# Patient Record
Sex: Female | Born: 1935 | Race: Black or African American | Hispanic: No | Smoking: Former smoker
Health system: Southern US, Community
[De-identification: ages and names within clinical notes are randomized; demographics above are authoritative.]

## PROBLEM LIST (undated history)

## (undated) DIAGNOSIS — Z992 Dependence on renal dialysis: Secondary | ICD-10-CM

## (undated) DIAGNOSIS — G479 Sleep disorder, unspecified: Secondary | ICD-10-CM

## (undated) DIAGNOSIS — N289 Disorder of kidney and ureter, unspecified: Secondary | ICD-10-CM

## (undated) DIAGNOSIS — R4182 Altered mental status, unspecified: Secondary | ICD-10-CM

## (undated) DIAGNOSIS — E162 Hypoglycemia, unspecified: Secondary | ICD-10-CM

## (undated) DIAGNOSIS — R351 Nocturia: Secondary | ICD-10-CM

## (undated) DIAGNOSIS — I10 Essential (primary) hypertension: Secondary | ICD-10-CM

## (undated) DIAGNOSIS — I150 Renovascular hypertension: Secondary | ICD-10-CM

## (undated) DIAGNOSIS — M179 Osteoarthritis of knee, unspecified: Secondary | ICD-10-CM

## (undated) DIAGNOSIS — I5031 Acute diastolic (congestive) heart failure: Secondary | ICD-10-CM

## (undated) DIAGNOSIS — E119 Type 2 diabetes mellitus without complications: Secondary | ICD-10-CM

## (undated) DIAGNOSIS — N186 End stage renal disease: Secondary | ICD-10-CM

## (undated) DIAGNOSIS — D573 Sickle-cell trait: Secondary | ICD-10-CM

## (undated) DIAGNOSIS — M1990 Unspecified osteoarthritis, unspecified site: Secondary | ICD-10-CM

## (undated) DIAGNOSIS — M1710 Unilateral primary osteoarthritis, unspecified knee: Secondary | ICD-10-CM

## (undated) DIAGNOSIS — J811 Chronic pulmonary edema: Secondary | ICD-10-CM

## (undated) DIAGNOSIS — E785 Hyperlipidemia, unspecified: Secondary | ICD-10-CM

## (undated) DIAGNOSIS — E7800 Pure hypercholesterolemia, unspecified: Secondary | ICD-10-CM

## (undated) HISTORY — PX: HERNIA REPAIR: SHX51

## (undated) HISTORY — PX: OTHER SURGICAL HISTORY: SHX169

## (undated) HISTORY — DX: Hyperlipidemia, unspecified: E78.5

## (undated) HISTORY — DX: Unilateral primary osteoarthritis, unspecified knee: M17.10

## (undated) HISTORY — DX: Renovascular hypertension: I15.0

## (undated) HISTORY — DX: Hypoglycemia, unspecified: E16.2

## (undated) HISTORY — DX: Osteoarthritis of knee, unspecified: M17.9

## (undated) HISTORY — DX: Chronic pulmonary edema: J81.1

## (undated) HISTORY — DX: Altered mental status, unspecified: R41.82

## (undated) HISTORY — PX: JOINT REPLACEMENT: SHX530

## (undated) HISTORY — DX: Acute diastolic (congestive) heart failure: I50.31

## (undated) HISTORY — DX: Morbid (severe) obesity due to excess calories: E66.01

---

## 1977-06-03 HISTORY — PX: TUBAL LIGATION: SHX77

## 1979-02-02 HISTORY — PX: BREAST BIOPSY: SHX20

## 2005-01-11 ENCOUNTER — Encounter: Admission: RE | Admit: 2005-01-11 | Discharge: 2005-01-11 | Payer: Self-pay | Admitting: Internal Medicine

## 2006-02-20 ENCOUNTER — Encounter: Admission: RE | Admit: 2006-02-20 | Discharge: 2006-02-20 | Payer: Self-pay | Admitting: Internal Medicine

## 2007-06-02 ENCOUNTER — Encounter: Admission: RE | Admit: 2007-06-02 | Discharge: 2007-06-02 | Payer: Self-pay | Admitting: Internal Medicine

## 2007-06-03 ENCOUNTER — Encounter: Admission: RE | Admit: 2007-06-03 | Discharge: 2007-06-03 | Payer: Self-pay | Admitting: Internal Medicine

## 2008-11-22 ENCOUNTER — Encounter: Admission: RE | Admit: 2008-11-22 | Discharge: 2008-11-22 | Payer: Self-pay | Admitting: Internal Medicine

## 2008-12-15 ENCOUNTER — Encounter (INDEPENDENT_AMBULATORY_CARE_PROVIDER_SITE_OTHER): Payer: Self-pay | Admitting: Interventional Radiology

## 2008-12-15 ENCOUNTER — Ambulatory Visit (HOSPITAL_COMMUNITY): Admission: RE | Admit: 2008-12-15 | Discharge: 2008-12-15 | Payer: Self-pay | Admitting: Surgery

## 2008-12-20 ENCOUNTER — Encounter: Admission: RE | Admit: 2008-12-20 | Discharge: 2008-12-20 | Payer: Self-pay | Admitting: Diagnostic Radiology

## 2009-02-03 ENCOUNTER — Ambulatory Visit (HOSPITAL_COMMUNITY): Admission: RE | Admit: 2009-02-03 | Discharge: 2009-02-03 | Payer: Self-pay | Admitting: Surgery

## 2009-06-03 HISTORY — PX: TOTAL KNEE ARTHROPLASTY: SHX125

## 2009-07-12 ENCOUNTER — Encounter: Admission: RE | Admit: 2009-07-12 | Discharge: 2009-07-12 | Payer: Self-pay | Admitting: Internal Medicine

## 2009-10-02 ENCOUNTER — Inpatient Hospital Stay (HOSPITAL_COMMUNITY): Admission: RE | Admit: 2009-10-02 | Discharge: 2009-10-05 | Payer: Self-pay | Admitting: Orthopedic Surgery

## 2009-10-16 ENCOUNTER — Emergency Department (HOSPITAL_COMMUNITY): Admission: EM | Admit: 2009-10-16 | Discharge: 2009-10-16 | Payer: Self-pay | Admitting: Emergency Medicine

## 2010-06-24 ENCOUNTER — Encounter: Payer: Self-pay | Admitting: Internal Medicine

## 2010-08-17 ENCOUNTER — Other Ambulatory Visit: Payer: Self-pay | Admitting: Internal Medicine

## 2010-08-17 DIAGNOSIS — Z1231 Encounter for screening mammogram for malignant neoplasm of breast: Secondary | ICD-10-CM

## 2010-08-20 LAB — URINALYSIS, ROUTINE W REFLEX MICROSCOPIC
Bilirubin Urine: NEGATIVE
Protein, ur: NEGATIVE mg/dL
Urobilinogen, UA: 0.2 mg/dL (ref 0.0–1.0)

## 2010-08-20 LAB — CBC
HCT: 34.6 % — ABNORMAL LOW (ref 36.0–46.0)
MCHC: 33.6 g/dL (ref 30.0–36.0)
MCV: 84 fL (ref 78.0–100.0)
Platelets: 422 10*3/uL — ABNORMAL HIGH (ref 150–400)

## 2010-08-20 LAB — DIFFERENTIAL
Basophils Absolute: 0 10*3/uL (ref 0.0–0.1)
Basophils Relative: 0 % (ref 0–1)
Lymphs Abs: 0.6 10*3/uL — ABNORMAL LOW (ref 0.7–4.0)
Monocytes Absolute: 0.6 10*3/uL (ref 0.1–1.0)
Neutrophils Relative %: 86 % — ABNORMAL HIGH (ref 43–77)

## 2010-08-20 LAB — BASIC METABOLIC PANEL
CO2: 24 mEq/L (ref 19–32)
Glucose, Bld: 58 mg/dL — ABNORMAL LOW (ref 70–99)
Potassium: 3.7 mEq/L (ref 3.5–5.1)
Sodium: 141 mEq/L (ref 135–145)

## 2010-08-20 LAB — GLUCOSE, CAPILLARY: Glucose-Capillary: 107 mg/dL — ABNORMAL HIGH (ref 70–99)

## 2010-08-20 LAB — PROTIME-INR: INR: 2.55 — ABNORMAL HIGH (ref 0.00–1.49)

## 2010-08-21 LAB — TYPE AND SCREEN: Antibody Screen: NEGATIVE

## 2010-08-21 LAB — CBC
HCT: 25.6 % — ABNORMAL LOW (ref 36.0–46.0)
HCT: 30.4 % — ABNORMAL LOW (ref 36.0–46.0)
Hemoglobin: 8.3 g/dL — ABNORMAL LOW (ref 12.0–15.0)
Hemoglobin: 9 g/dL — ABNORMAL LOW (ref 12.0–15.0)
MCHC: 31.8 g/dL (ref 30.0–36.0)
MCHC: 32.5 g/dL (ref 30.0–36.0)
MCHC: 32.4 g/dL (ref 30.0–36.0)
MCV: 84 fL (ref 78.0–100.0)
MCV: 83.6 fL (ref 78.0–100.0)
Platelets: 139 10*3/uL — ABNORMAL LOW (ref 150–400)
Platelets: 208 10*3/uL (ref 150–400)
RBC: 3.04 MIL/uL — ABNORMAL LOW (ref 3.87–5.11)
RDW: 16.8 % — ABNORMAL HIGH (ref 11.5–15.5)
RDW: 17.4 % — ABNORMAL HIGH (ref 11.5–15.5)
RDW: 17.5 % — ABNORMAL HIGH (ref 11.5–15.5)
RDW: 17.8 % — ABNORMAL HIGH (ref 11.5–15.5)

## 2010-08-21 LAB — BASIC METABOLIC PANEL
BUN: 23 mg/dL (ref 6–23)
CO2: 23 mEq/L (ref 19–32)
CO2: 25 mEq/L (ref 19–32)
Calcium: 8.1 mg/dL — ABNORMAL LOW (ref 8.4–10.5)
Chloride: 106 mEq/L (ref 96–112)
Creatinine, Ser: 1.14 mg/dL (ref 0.4–1.2)
Creatinine, Ser: 1.21 mg/dL — ABNORMAL HIGH (ref 0.4–1.2)
GFR calc Af Amer: 46 mL/min — ABNORMAL LOW (ref >60)
GFR calc Af Amer: 57 mL/min — ABNORMAL LOW (ref >60)
GFR calc non Af Amer: 44 mL/min — ABNORMAL LOW (ref >60)
GFR calc non Af Amer: 47 mL/min — ABNORMAL LOW (ref >60)
Glucose, Bld: 147 mg/dL — ABNORMAL HIGH (ref 70–99)
Glucose, Bld: 152 mg/dL — ABNORMAL HIGH (ref 70–99)
Glucose, Bld: 155 mg/dL — ABNORMAL HIGH (ref 70–99)
Potassium: 3.9 mEq/L (ref 3.5–5.1)
Potassium: 4.3 mEq/L (ref 3.5–5.1)
Sodium: 134 mEq/L — ABNORMAL LOW (ref 135–145)
Sodium: 137 mEq/L (ref 135–145)

## 2010-08-21 LAB — COMPREHENSIVE METABOLIC PANEL
ALT: 15 U/L (ref 0–35)
AST: 19 U/L (ref 0–37)
Calcium: 9.7 mg/dL (ref 8.4–10.5)
GFR calc Af Amer: >60 mL/min (ref >60)
Sodium: 143 mEq/L (ref 135–145)
Total Protein: 7.1 g/dL (ref 6.0–8.3)

## 2010-08-21 LAB — GLUCOSE, CAPILLARY
Glucose-Capillary: 133 mg/dL — ABNORMAL HIGH (ref 70–99)
Glucose-Capillary: 148 mg/dL — ABNORMAL HIGH (ref 70–99)
Glucose-Capillary: 148 mg/dL — ABNORMAL HIGH (ref 70–99)
Glucose-Capillary: 166 mg/dL — ABNORMAL HIGH (ref 70–99)
Glucose-Capillary: 213 mg/dL — ABNORMAL HIGH (ref 70–99)

## 2010-08-21 LAB — URINE MICROSCOPIC-ADD ON

## 2010-08-21 LAB — PROTIME-INR
INR: 1.23 (ref 0.00–1.49)
INR: 1.54 — ABNORMAL HIGH (ref 0.00–1.49)
Prothrombin Time: 15.4 seconds — ABNORMAL HIGH (ref 11.6–15.2)
Prothrombin Time: 20.4 seconds — ABNORMAL HIGH (ref 11.6–15.2)

## 2010-08-21 LAB — URINALYSIS, ROUTINE W REFLEX MICROSCOPIC
Hgb urine dipstick: NEGATIVE
Nitrite: NEGATIVE
Specific Gravity, Urine: 1.016 (ref 1.005–1.030)
Urobilinogen, UA: 0.2 mg/dL (ref 0.0–1.0)

## 2010-08-21 LAB — APTT: aPTT: 33 seconds (ref 24–37)

## 2010-09-10 LAB — CBC
HCT: 38.3 % (ref 36.0–46.0)
MCV: 84.8 fL (ref 78.0–100.0)
RBC: 4.51 MIL/uL (ref 3.87–5.11)
WBC: 9.6 10*3/uL (ref 4.0–10.5)

## 2010-09-10 LAB — GLUCOSE, CAPILLARY: Glucose-Capillary: 143 mg/dL — ABNORMAL HIGH (ref 70–99)

## 2010-09-10 LAB — CULTURE, ROUTINE-ABSCESS
Culture: NO GROWTH
Gram Stain: NONE SEEN

## 2010-09-10 LAB — APTT: aPTT: 38 seconds — ABNORMAL HIGH (ref 24–37)

## 2010-09-10 LAB — PROTIME-INR: Prothrombin Time: 13.9 seconds (ref 11.6–15.2)

## 2010-09-12 ENCOUNTER — Ambulatory Visit
Admission: RE | Admit: 2010-09-12 | Discharge: 2010-09-12 | Disposition: A | Discharge status: Home / Self Care | Payer: Medicare Other | Source: Ambulatory Visit | Attending: Internal Medicine | Admitting: Internal Medicine

## 2010-09-12 DIAGNOSIS — Z1231 Encounter for screening mammogram for malignant neoplasm of breast: Secondary | ICD-10-CM

## 2011-09-20 ENCOUNTER — Other Ambulatory Visit: Payer: Self-pay | Admitting: Internal Medicine

## 2011-09-20 DIAGNOSIS — Z1231 Encounter for screening mammogram for malignant neoplasm of breast: Secondary | ICD-10-CM

## 2011-10-09 ENCOUNTER — Ambulatory Visit
Admission: RE | Admit: 2011-10-09 | Discharge: 2011-10-09 | Disposition: A | Discharge status: Home / Self Care | Payer: Medicare Other | Source: Ambulatory Visit | Attending: Internal Medicine | Admitting: Internal Medicine

## 2011-10-09 DIAGNOSIS — Z1231 Encounter for screening mammogram for malignant neoplasm of breast: Secondary | ICD-10-CM

## 2012-02-03 ENCOUNTER — Inpatient Hospital Stay (HOSPITAL_COMMUNITY): Payer: Medicare Other

## 2012-02-03 ENCOUNTER — Encounter (HOSPITAL_COMMUNITY): Payer: Self-pay | Admitting: Emergency Medicine

## 2012-02-03 ENCOUNTER — Inpatient Hospital Stay (HOSPITAL_COMMUNITY)
Admission: EM | Admit: 2012-02-03 | Discharge: 2012-02-05 | DRG: 638 | Disposition: A | Discharge status: Home Health | Payer: Medicare Other | Attending: Internal Medicine | Admitting: Internal Medicine

## 2012-02-03 ENCOUNTER — Emergency Department (HOSPITAL_COMMUNITY): Payer: Medicare Other

## 2012-02-03 DIAGNOSIS — E1165 Type 2 diabetes mellitus with hyperglycemia: Secondary | ICD-10-CM | POA: Diagnosis present

## 2012-02-03 DIAGNOSIS — E785 Hyperlipidemia, unspecified: Secondary | ICD-10-CM | POA: Diagnosis present

## 2012-02-03 DIAGNOSIS — M1990 Unspecified osteoarthritis, unspecified site: Secondary | ICD-10-CM

## 2012-02-03 DIAGNOSIS — R4182 Altered mental status, unspecified: Secondary | ICD-10-CM

## 2012-02-03 DIAGNOSIS — N179 Acute kidney failure, unspecified: Secondary | ICD-10-CM

## 2012-02-03 DIAGNOSIS — E162 Hypoglycemia, unspecified: Secondary | ICD-10-CM | POA: Diagnosis present

## 2012-02-03 DIAGNOSIS — I10 Essential (primary) hypertension: Secondary | ICD-10-CM

## 2012-02-03 DIAGNOSIS — R531 Weakness: Secondary | ICD-10-CM

## 2012-02-03 DIAGNOSIS — E119 Type 2 diabetes mellitus without complications: Secondary | ICD-10-CM

## 2012-02-03 DIAGNOSIS — N182 Chronic kidney disease, stage 2 (mild): Secondary | ICD-10-CM | POA: Diagnosis present

## 2012-02-03 DIAGNOSIS — R5383 Other fatigue: Secondary | ICD-10-CM

## 2012-02-03 DIAGNOSIS — T502X5A Adverse effect of carbonic-anhydrase inhibitors, benzothiadiazides and other diuretics, initial encounter: Secondary | ICD-10-CM | POA: Diagnosis present

## 2012-02-03 DIAGNOSIS — Z6841 Body Mass Index (BMI) 40.0 and over, adult: Secondary | ICD-10-CM

## 2012-02-03 DIAGNOSIS — E1129 Type 2 diabetes mellitus with other diabetic kidney complication: Secondary | ICD-10-CM | POA: Diagnosis present

## 2012-02-03 DIAGNOSIS — E1169 Type 2 diabetes mellitus with other specified complication: Principal | ICD-10-CM | POA: Diagnosis present

## 2012-02-03 DIAGNOSIS — IMO0002 Reserved for concepts with insufficient information to code with codable children: Secondary | ICD-10-CM | POA: Diagnosis present

## 2012-02-03 DIAGNOSIS — D573 Sickle-cell trait: Secondary | ICD-10-CM

## 2012-02-03 DIAGNOSIS — Y92009 Unspecified place in unspecified non-institutional (private) residence as the place of occurrence of the external cause: Secondary | ICD-10-CM

## 2012-02-03 DIAGNOSIS — I129 Hypertensive chronic kidney disease with stage 1 through stage 4 chronic kidney disease, or unspecified chronic kidney disease: Secondary | ICD-10-CM | POA: Diagnosis present

## 2012-02-03 HISTORY — DX: Essential (primary) hypertension: I10

## 2012-02-03 HISTORY — DX: Sickle-cell trait: D57.3

## 2012-02-03 HISTORY — DX: Type 2 diabetes mellitus without complications: E11.9

## 2012-02-03 HISTORY — DX: Pure hypercholesterolemia, unspecified: E78.00

## 2012-02-03 HISTORY — DX: Disorder of kidney and ureter, unspecified: N28.9

## 2012-02-03 HISTORY — DX: Unspecified osteoarthritis, unspecified site: M19.90

## 2012-02-03 LAB — CBC
HCT: 37.5 % (ref 36.0–46.0)
Hemoglobin: 12.8 g/dL (ref 12.0–15.0)
RBC: 4.65 MIL/uL (ref 3.87–5.11)
WBC: 12.1 10*3/uL — ABNORMAL HIGH (ref 4.0–10.5)

## 2012-02-03 LAB — CBC WITH DIFFERENTIAL/PLATELET
Basophils Relative: 0 % (ref 0–1)
HCT: 41.4 % (ref 36.0–46.0)
Hemoglobin: 14.3 g/dL (ref 12.0–15.0)
Lymphs Abs: 1 10*3/uL (ref 0.7–4.0)
MCHC: 34.5 g/dL (ref 30.0–36.0)
Monocytes Absolute: 0.3 10*3/uL (ref 0.1–1.0)
Monocytes Relative: 3 % (ref 3–12)
Neutro Abs: 10.3 10*3/uL — ABNORMAL HIGH (ref 1.7–7.7)
Neutrophils Relative %: 88 % — ABNORMAL HIGH (ref 43–77)
RBC: 5.12 MIL/uL — ABNORMAL HIGH (ref 3.87–5.11)

## 2012-02-03 LAB — GLUCOSE, CAPILLARY
Glucose-Capillary: 185 mg/dL — ABNORMAL HIGH (ref 70–99)
Glucose-Capillary: 312 mg/dL — ABNORMAL HIGH (ref 70–99)

## 2012-02-03 LAB — COMPREHENSIVE METABOLIC PANEL
Albumin: 3.8 g/dL (ref 3.5–5.2)
Alkaline Phosphatase: 107 U/L (ref 39–117)
BUN: 27 mg/dL — ABNORMAL HIGH (ref 6–23)
CO2: 24 mEq/L (ref 19–32)
Chloride: 101 mEq/L (ref 96–112)
Creatinine, Ser: 1.3 mg/dL — ABNORMAL HIGH (ref 0.50–1.10)
GFR calc Af Amer: 45 mL/min — ABNORMAL LOW (ref >90)
GFR calc non Af Amer: 39 mL/min — ABNORMAL LOW (ref >90)
Glucose, Bld: 130 mg/dL — ABNORMAL HIGH (ref 70–99)
Potassium: 3.9 mEq/L (ref 3.5–5.1)
Total Bilirubin: 0.6 mg/dL (ref 0.3–1.2)

## 2012-02-03 LAB — URINE MICROSCOPIC-ADD ON

## 2012-02-03 LAB — URINALYSIS, ROUTINE W REFLEX MICROSCOPIC
Bilirubin Urine: NEGATIVE
Ketones, ur: 15 mg/dL — AB
Leukocytes, UA: NEGATIVE
Nitrite: NEGATIVE
Protein, ur: 30 mg/dL — AB
Urobilinogen, UA: 1 mg/dL (ref 0.0–1.0)

## 2012-02-03 LAB — CREATININE, SERUM
Creatinine, Ser: 1.18 mg/dL — ABNORMAL HIGH (ref 0.50–1.10)
GFR calc Af Amer: 51 mL/min — ABNORMAL LOW (ref >90)
GFR calc non Af Amer: 44 mL/min — ABNORMAL LOW (ref >90)

## 2012-02-03 LAB — TROPONIN I: Troponin I: <0.3 ng/mL (ref <0.30)

## 2012-02-03 MED ORDER — ASPIRIN 325 MG PO TABS
325.0000 mg | ORAL_TABLET | Freq: Every day | ORAL | Status: DC
  Administered 2012-02-03 – 2012-02-05 (×3): 325 mg via ORAL
  Filled 2012-02-03 (×3): qty 1

## 2012-02-03 MED ORDER — CARVEDILOL 25 MG PO TABS
25.0000 mg | ORAL_TABLET | Freq: Two times a day (BID) | ORAL | Status: DC
  Administered 2012-02-03 – 2012-02-05 (×4): 25 mg via ORAL
  Filled 2012-02-03 (×6): qty 1

## 2012-02-03 MED ORDER — SENNOSIDES-DOCUSATE SODIUM 8.6-50 MG PO TABS: 1.0000 | ORAL_TABLET | Freq: Every evening | ORAL | Status: DC | PRN

## 2012-02-03 MED ORDER — CLONIDINE HCL 0.2 MG PO TABS
0.2000 mg | ORAL_TABLET | Freq: Two times a day (BID) | ORAL | Status: DC
  Administered 2012-02-03 – 2012-02-05 (×5): 0.2 mg via ORAL
  Filled 2012-02-03 (×6): qty 1

## 2012-02-03 MED ORDER — ACETAMINOPHEN 325 MG PO TABS: 650.0000 mg | ORAL_TABLET | ORAL | Status: DC | PRN

## 2012-02-03 MED ORDER — SODIUM CHLORIDE 0.9 % IV SOLN: INTRAVENOUS | Status: DC

## 2012-02-03 MED ORDER — ENOXAPARIN SODIUM 40 MG/0.4ML ~~LOC~~ SOLN
40.0000 mg | SUBCUTANEOUS | Status: DC
  Administered 2012-02-03 – 2012-02-05 (×3): 40 mg via SUBCUTANEOUS
  Filled 2012-02-03 (×3): qty 0.4

## 2012-02-03 MED ORDER — LORAZEPAM 2 MG/ML IJ SOLN
1.0000 mg | Freq: Once | INTRAMUSCULAR | Status: AC
  Administered 2012-02-03: 2 mg via INTRAVENOUS
  Filled 2012-02-03: qty 1

## 2012-02-03 MED ORDER — AMLODIPINE BESYLATE 10 MG PO TABS
10.0000 mg | ORAL_TABLET | Freq: Every day | ORAL | Status: DC
  Administered 2012-02-03 – 2012-02-05 (×3): 10 mg via ORAL
  Filled 2012-02-03 (×3): qty 1

## 2012-02-03 MED ORDER — ASPIRIN 300 MG RE SUPP
300.0000 mg | Freq: Every day | RECTAL | Status: DC
  Filled 2012-02-03 (×3): qty 1

## 2012-02-03 MED ORDER — ATORVASTATIN CALCIUM 20 MG PO TABS
20.0000 mg | ORAL_TABLET | Freq: Every day | ORAL | Status: DC
  Administered 2012-02-03 – 2012-02-04 (×2): 20 mg via ORAL
  Filled 2012-02-03 (×3): qty 1

## 2012-02-03 MED ORDER — SODIUM CHLORIDE 0.9 % IV SOLN
INTRAVENOUS | Status: AC
  Administered 2012-02-03: 11:00:00 via INTRAVENOUS

## 2012-02-03 MED ORDER — ACETAMINOPHEN 650 MG RE SUPP: 650.0000 mg | RECTAL | Status: DC | PRN

## 2012-02-03 MED ORDER — ONDANSETRON HCL 4 MG/2ML IJ SOLN: 4.0000 mg | Freq: Four times a day (QID) | INTRAMUSCULAR | Status: DC | PRN

## 2012-02-03 MED ORDER — LATANOPROST 0.005 % OP SOLN
1.0000 [drp] | Freq: Every day | OPHTHALMIC | Status: DC
  Administered 2012-02-03 – 2012-02-04 (×2): 1 [drp] via OPHTHALMIC
  Filled 2012-02-03: qty 2.5

## 2012-02-03 NOTE — ED Provider Notes (Signed)
History     CSN: [id number]  Arrival date & time 02/03/12  1914   First MD Initiated Contact with Patient 02/03/12 [phone]      Chief Complaint  Patient presents with  . Weakness    (Consider location/radiation/quality/duration/timing/severity/associated sxs/prior treatment) HPI Comments: Patient was brought in today by EMS for generalized weakness. She states that she woke up this morning and was diaphoretic and had been incontinent of urine. She felt weak all over. She was having a hard time with her thinking. She was a heart having a hard time with the coordination of her extremities. She was trying to dial 911 but was having a hard time figuring out how to do it. She states she felt weak all over and not more on one side versus the other. She states she felt fine when she went to bed last night. She denies any chest pain or shortness of breath. Denies any headache. She was having little bit of a hard time getting her words out. She denies any vision changes. Denies any recent head injuries. Denies any cough congestion or other recent illnesses. Denies any UTI symptoms. EMS had found her blood glucose to be 63 and gave her some oral glucose. She states that she feels a little bit better now but still feels weak all over. She denies any seizure history.  Patient is a 78 y.o. female presenting with weakness.  Weakness Primary symptoms do not include headaches, dizziness, fever, nausea or vomiting.  Additional symptoms include weakness.    Past Medical History  Diagnosis Date  . Diabetes mellitus   . Hypertension   . Renal disorder     Past Surgical History  Procedure Date  . Joint replacement     No family history on file.  History  Substance Use Topics  . Smoking status: Never Smoker   . Smokeless tobacco: Not on file  . Alcohol Use: No    OB History    Grav Para Term Preterm Abortions TAB SAB Ect Mult Living                  Review of Systems  Constitutional:  Positive for diaphoresis and fatigue. Negative for fever and chills.  HENT: Negative for congestion, rhinorrhea and sneezing.   Eyes: Negative.  Negative for visual disturbance.  Respiratory: Negative for cough, chest tightness and shortness of breath.   Cardiovascular: Negative for chest pain and leg swelling.  Gastrointestinal: Negative for nausea, vomiting, abdominal pain, diarrhea and blood in stool.  Genitourinary: Negative for frequency, hematuria, flank pain and difficulty urinating.  Musculoskeletal: Negative for back pain and arthralgias.  Skin: Negative for rash.  Neurological: Positive for speech difficulty and weakness. Negative for dizziness, numbness and headaches.  Psychiatric/Behavioral: Positive for confusion.    Allergies  Peanut-containing drug products  Home Medications   Current Outpatient Rx  Name Route Sig Dispense Refill  . AMLODIPINE BESYLATE 10 MG PO TABS Oral Take 10 mg by mouth daily.    . ASPIRIN EC 81 MG PO TBEC Oral Take 81 mg by mouth daily.    Marland Kitchen CARVEDILOL 25 MG PO TABS Oral Take 25 mg by mouth 2 (two) times daily with a meal.    . CLONIDINE HCL 0.2 MG PO TABS Oral Take 0.2 mg by mouth 2 (two) times daily.    Marland Kitchen GLIMEPIRIDE 4 MG PO TABS Oral Take 4 mg by mouth 2 (two) times daily.    . INSULIN GLARGINE 100 UNIT/ML Margaret SOLN  Subcutaneous Inject 30 Units into the skin at bedtime. Patient only took 25 units last night.    Marland Kitchen LATANOPROST 0.005 % OP SOLN Both Eyes Place 1 drop into both eyes at bedtime.    Marland Kitchen LOSARTAN POTASSIUM-HCTZ 100-25 MG PO TABS Oral Take 1 tablet by mouth daily.    Marland Kitchen ROSUVASTATIN CALCIUM 10 MG PO TABS Oral Take 10 mg by mouth daily.    Marland Kitchen SITAGLIPTIN PHOSPHATE 100 MG PO TABS Oral Take 100 mg by mouth daily.      BP 144/67  Pulse 73  Temp 97.5 F (36.4 C) (Oral)  Resp 18  SpO2 98%  Physical Exam  Constitutional: She is oriented to person, place, and time. She appears well-developed and well-nourished.  HENT:  Head: Normocephalic  and atraumatic.  Eyes: Pupils are equal, round, and reactive to light.  Neck: Normal range of motion. Neck supple.  Cardiovascular: Normal rate, regular rhythm and normal heart sounds.   Pulmonary/Chest: Effort normal and breath sounds normal. No respiratory distress. She has no wheezes. She has no rales. She exhibits no tenderness.  Abdominal: Soft. Bowel sounds are normal. There is no tenderness. There is no rebound and no guarding.  Musculoskeletal: Normal range of motion. She exhibits no edema.  Lymphadenopathy:    She has no cervical adenopathy.  Neurological: She is alert and oriented to person, place, and time. She has normal strength. No cranial nerve deficit or sensory deficit. GCS eye subscore is 4. GCS verbal subscore is 5. GCS motor subscore is 6.       FTN intact  Skin: Skin is warm and dry. No rash noted.  Psychiatric: She has a normal mood and affect.    ED Course  Procedures (including critical care time)  Results for orders placed during the hospital encounter of 02/03/12  CBC WITH DIFFERENTIAL      Component Value Range   WBC 11.7 (*) 4.0 - 10.5 K/uL   RBC 5.12 (*) 3.87 - 5.11 MIL/uL   Hemoglobin 14.3  12.0 - 15.0 g/dL   HCT 57.8  46.9 - 62.9 %   MCV 80.9  78.0 - 100.0 fL   MCH 27.9  26.0 - 34.0 pg   MCHC 34.5  30.0 - 36.0 g/dL   RDW 52.8  41.3 - 24.4 %   Platelets 199  150 - 400 K/uL   Neutrophils Relative 88 (*) 43 - 77 %   Neutro Abs 10.3 (*) 1.7 - 7.7 K/uL   Lymphocytes Relative 9 (*) 12 - 46 %   Lymphs Abs 1.0  0.7 - 4.0 K/uL   Monocytes Relative 3  3 - 12 %   Monocytes Absolute 0.3  0.1 - 1.0 K/uL   Eosinophils Relative 0  0 - 5 %   Eosinophils Absolute 0.0  0.0 - 0.7 K/uL   Basophils Relative 0  0 - 1 %   Basophils Absolute 0.0  0.0 - 0.1 K/uL  COMPREHENSIVE METABOLIC PANEL      Component Value Range   Sodium 140  135 - 145 mEq/L   Potassium 3.9  3.5 - 5.1 mEq/L   Chloride 101  96 - 112 mEq/L   CO2 24  19 - 32 mEq/L   Glucose, Bld 130 (*) 70 - 99  mg/dL   BUN 27 (*) 6 - 23 mg/dL   Creatinine, Ser 0.10 (*) 0.50 - 1.10 mg/dL   Calcium 27.2  8.4 - 53.6 mg/dL   Total Protein 8.0  6.0 -  8.3 g/dL   Albumin 3.8  3.5 - 5.2 g/dL   AST 24  0 - 37 U/L   ALT 19  0 - 35 U/L   Alkaline Phosphatase 107  39 - 117 U/L   Total Bilirubin 0.6  0.3 - 1.2 mg/dL   GFR calc non Af Amer 39 (*) >90 mL/min   GFR calc Af Amer 45 (*) >90 mL/min  TROPONIN I      Component Value Range   Troponin I <0.30  <0.30 ng/mL  URINALYSIS, ROUTINE W REFLEX MICROSCOPIC      Component Value Range   Color, Urine YELLOW  YELLOW   APPearance CLEAR  CLEAR   Specific Gravity, Urine 1.008  1.005 - 1.030   pH 6.5  5.0 - 8.0   Glucose, UA NEGATIVE  NEGATIVE mg/dL   Hgb urine dipstick TRACE (*) NEGATIVE   Bilirubin Urine NEGATIVE  NEGATIVE   Ketones, ur 15 (*) NEGATIVE mg/dL   Protein, ur 30 (*) NEGATIVE mg/dL   Urobilinogen, UA 1.0  0.0 - 1.0 mg/dL   Nitrite NEGATIVE  NEGATIVE   Leukocytes, UA NEGATIVE  NEGATIVE  URINE MICROSCOPIC-ADD ON      Component Value Range   Squamous Epithelial / LPF RARE  RARE   WBC, UA 0-2  <3 WBC/hpf   RBC / HPF 0-2  <3 RBC/hpf   Bacteria, UA RARE  RARE   Dg Chest 2 View  02/03/2012  *RADIOLOGY REPORT*  Clinical Data: Weakness and difficulty with coordination. Hypoglycemia.  CHEST - 2 VIEW  Comparison: 10/16/2009  Findings: The lungs are clear.  No edema or infiltrates.  Stable chronic elevation of the right hemidiaphragm.  Heart size is normal.  IMPRESSION: No acute findings.   Original Report Authenticated By: Reola Calkins, M.D.    Ct Head Wo Contrast  02/03/2012  *RADIOLOGY REPORT*  Clinical Data: Confusion.  Diabetes.  Hypertension.  CT HEAD WITHOUT CONTRAST  Technique:  Contiguous axial images were obtained from the base of the skull through the vertex without contrast.  Comparison: None.  Findings: Bone windows demonstrate mucosal thickening of ethmoid air cells. Clear mastoid air cells.  Aerated petrous apices.  Soft tissue windows  demonstrate mild low density in the periventricular white matter likely related to small vessel disease. No  mass lesion, hemorrhage, hydrocephalus, acute infarct, intra-axial, or extra-axial fluid collection.  IMPRESSION:  1. No acute intracranial abnormality. 2.  Sinus disease.   Original Report Authenticated By: Consuello Bossier, M.D.       1. Weakness       MDM  DDx includes hypoglycemia, seizure, TIA.  Pt without symptoms now.  No focal deficits.  Discussed with hospitalist who will admit pt        Rolan Bucco, MD 02/03/12 1034

## 2012-02-03 NOTE — ED Notes (Signed)
To ED from home via EMS, pt reports incontinence in bed overnight with weakness and difficulty with coordination, CBG initially 63, 74 after oral glucose, pt is type diabetic, pt reports improvement on arrival, stroke scale negative per EMS, NAD

## 2012-02-03 NOTE — ED Notes (Signed)
Vital signs stable. 

## 2012-02-03 NOTE — H&P (Signed)
PATIENT DETAILS Name: Katherine [name] Age: 78 y.o. Sex: female Date of Birth: Jul 24, 1933 Admit Date: 02/03/2012 PCP:No primary provider on file.   CHIEF COMPLAINT:  Transient Confusion/Disorientation  HPI: Patient is a 78 year old African American female with a past medical history of diabetes, hypertension, dyslipidemia, obesity who presented to the ED with the above-noted complaints. Per patient about 5:30 AM this morning she woke up and noticed that she was incontinent, she was sweating, upon getting up and ambulating she had some difficulty with coordination. She also complained of generalized weakness. She tried to call for help-however had a hard time dialing 911. She was subsequently evaluated by EMS and was found to be hypoglycemic with a blood sugars in the 60s-and was given oral glucose. She was then brought to the ED and I was asked to admit this patient for further evaluation. During my evaluation, all of the above noted complaints have completely resolved. Patient claims she is back to her usual baseline. Per patient report episode lasted approximately half to one hour. The patient she does not have a history of seizures, she is a diabetic and did take her insulin last night, however did not miss any of her meals. She claims that there is no way she accidentally gave herself more insulin than usual.   patient denies any headache, shortness of breath, chest pain, abdominal pain, nausea vomiting or diarrhea.   ALLERGIES:   Allergies  Allergen Reactions  . Peanut-Containing Drug Products Anaphylaxis    Patient allergic to all nuts.     PAST MEDICAL HISTORY: Past Medical History  Diagnosis Date  . Diabetes mellitus   . Hypertension   . Renal disorder     PAST SURGICAL HISTORY: Past Surgical History  Procedure Date  . Joint replacement     MEDICATIONS AT HOME: Prior to Admission medications   Medication Sig Start Date End Date Taking? Authorizing Provider  amLODipine  (NORVASC) 10 MG tablet Take 10 mg by mouth daily.   Yes Historical Provider, MD  aspirin EC 81 MG tablet Take 81 mg by mouth daily.   Yes Historical Provider, MD  carvedilol (COREG) 25 MG tablet Take 25 mg by mouth 2 (two) times daily with a meal.   Yes Historical Provider, MD  cloNIDine (CATAPRES) 0.2 MG tablet Take 0.2 mg by mouth 2 (two) times daily.   Yes Historical Provider, MD  glimepiride (AMARYL) 4 MG tablet Take 4 mg by mouth 2 (two) times daily.   Yes Historical Provider, MD  insulin glargine (LANTUS) 100 UNIT/ML injection Inject 30 Units into the skin at bedtime. Patient only took 25 units last night.   Yes Historical Provider, MD  latanoprost (XALATAN) 0.005 % ophthalmic solution Place 1 drop into both eyes at bedtime.   Yes Historical Provider, MD  losartan-hydrochlorothiazide (HYZAAR) 100-25 MG per tablet Take 1 tablet by mouth daily.   Yes Historical Provider, MD  rosuvastatin (CRESTOR) 10 MG tablet Take 10 mg by mouth daily.   Yes Historical Provider, MD  sitaGLIPtin (JANUVIA) 100 MG tablet Take 100 mg by mouth daily.   Yes Historical Provider, MD    FAMILY HISTORY: No family history  of coronary artery disease   SOCIAL HISTORY:  reports that she has never smoked. She does not have any smokeless tobacco history on file. She reports that she does not drink alcohol. Her drug history not on file.  REVIEW OF SYSTEMS:  Constitutional:   No  weight loss, night sweats,  Fevers, chills, fatigue.  HEENT:    No headaches, Difficulty swallowing,Tooth/dental problems,Sore throat,  No sneezing, itching, ear ache, nasal congestion, post nasal drip,   Cardio-vascular: No chest pain,  Orthopnea, PND, swelling in lower extremities, anasarca, dizziness, palpitations  GI:  No heartburn, indigestion, abdominal pain, nausea, vomiting, diarrhea, change in bowel habits, loss of appetite  Resp: No shortness of breath with exertion or at rest.  No excess mucus, no productive cough, No  non-productive cough,  No coughing up of blood.No change in color of mucus.No wheezing.No chest wall deformity  Skin:  no rash or lesions.  GU:  no dysuria, change in color of urine, no urgency or frequency.  No flank pain.  Musculoskeletal: No joint pain or swelling.  No decreased range of motion.  No back pain.  Psych: No change in mood or affect. No depression or anxiety.  No memory loss.   PHYSICAL EXAM: Blood pressure 149/66, pulse 79, temperature 97.6 F (36.4 C), temperature source Oral, resp. rate 20, height 5\' 4"  (1.626 m), weight 133 kg (293 lb 3.4 oz), SpO2 97.00%.  General appearance :Awake, alert, not in any distress. Speech Clear. Not toxic Looking HEENT: Atraumatic and Normocephalic, pupils equally reactive to light and accomodation Neck: supple, no JVD. No cervical lymphadenopathy.  Chest:Good air entry bilaterally, no added sounds  CVS: S1 S2 regular, no murmurs.  Abdomen: Bowel sounds present, Non tender and not distended with no gaurding, rigidity or rebound. Extremities: B/L Lower Ext shows no edema, both legs are warm to touch, with  dorsalis pedis pulses palpable. Neurology: Awake alert, and oriented X 3, CN II-XII intact, Non focal Skin:No Rash Wounds:N/A  LABS ON ADMISSION:   Basename 02/03/12 1445 02/03/12 0805  NA -- 140  K -- 3.9  CL -- 101  CO2 -- 24  GLUCOSE -- 130*  BUN -- 27*  CREATININE 1.18* 1.30*  CALCIUM -- 10.3  MG -- --  PHOS -- --    Basename 02/03/12 0805  AST 24  ALT 19  ALKPHOS 107  BILITOT 0.6  PROT 8.0  ALBUMIN 3.8   No results found for this basename: LIPASE:2,AMYLASE:2 in the last 72 hours  Basename 02/03/12 1445 02/03/12 0805  WBC 12.1* 11.7*  NEUTROABS -- 10.3*  HGB 12.8 14.3  HCT 37.5 41.4  MCV 80.6 80.9  PLT 192 199    Basename 02/03/12 0805  CKTOTAL --  CKMB --  CKMBINDEX --  TROPONINI <0.30   No results found for this basename: DDIMER:2 in the last 72 hours No components found with this basename:  POCBNP:3   RADIOLOGIC STUDIES ON ADMISSION: Dg Chest 2 View  02/03/2012  *RADIOLOGY REPORT*  Clinical Data: Weakness and difficulty with coordination. Hypoglycemia.  CHEST - 2 VIEW  Comparison: 10/16/2009  Findings: The lungs are clear.  No edema or infiltrates.  Stable chronic elevation of the right hemidiaphragm.  Heart size is normal.  IMPRESSION: No acute findings.   Original Report Authenticated By: Reola Calkins, M.D.    Ct Head Wo Contrast  02/03/2012  *RADIOLOGY REPORT*  Clinical Data: Confusion.  Diabetes.  Hypertension.  CT HEAD WITHOUT CONTRAST  Technique:  Contiguous axial images were obtained from the base of the skull through the vertex without contrast.  Comparison: None.  Findings: Bone windows demonstrate mucosal thickening of ethmoid air cells. Clear mastoid air cells.  Aerated petrous apices.  Soft tissue windows demonstrate mild low density in the periventricular white matter likely related to small vessel disease. No  mass lesion,  hemorrhage, hydrocephalus, acute infarct, intra-axial, or extra-axial fluid collection.  IMPRESSION:  1. No acute intracranial abnormality. 2.  Sinus disease.   Original Report Authenticated By: Consuello Bossier, M.D.     ASSESSMENT AND PLAN: Present on Admission:  .Altered mental status -likely secondary to hypoglycemia-given diaphoresis, incontinence-question whether she had a hypoglycemic seizure.  -CT of the head negative on admission. MRI of the brain unable to be done given obesity. However she does not have any focal deficits on exam, clinical symptomatology more consistent with hypoglycemia than with the vascular event.  -Monitor, if symptoms reoccur again-and is not associated with hypoglycemia-consider EEG and neurology evaluation  -Doubt cardiovascular etiology-however would check a carotid Doppler and 2-D echo complete workup.  -Chest x-ray negative for pneumonia and UA negative for UTI.  Marland KitchenHypoglycemia -Patient on multiple oral  hypoglycemics as well as insulin  -We'll stop all agents and just monitor CBGs, if CBG start increasing then we will reinitiate therapy -Await A1c .  Marland KitchenDM (diabetes mellitus) -As above   .HTN (hypertension) -Resume some antihypertensive medications, depending on her blood pressure readings, slowly resume rest of her medications.   .Dyslipidemia -Continue with statin   .Morbid obesity -Tonsil extensively regarding the importance of weight loss   .CKD (chronic kidney disease) stage 2, GFR 60-89 ml/min -Creatinine currently very close to her usual baseline.  Further plan will depend as patient's clinical course evolves and further radiologic and laboratory data become available. Patient will be monitored closely.  DVT Prophylaxis: -Prophylactic Lovenox  Code Status: -Full code  Total time spent for admission equals 45 minutes.  [facility] Pager [phone]  If 7PM-7AM, please contact night-coverage [email or web address] Password [facility] 02/03/2012, 3:46 PM

## 2012-02-03 NOTE — Progress Notes (Signed)
I could not obtain IV access on this patient. IV team notified, with no success. Pt refusing to be "stuck" again. Dr. Jerral Ralph notified and is ok with no IV as long as pt does not have any hypoglycemic events. Will monitor CBGs and notify MD of hypoglycemic events.

## 2012-02-03 NOTE — ED Notes (Signed)
1mg Ativan given

## 2012-02-04 DIAGNOSIS — N182 Chronic kidney disease, stage 2 (mild): Secondary | ICD-10-CM

## 2012-02-04 DIAGNOSIS — E119 Type 2 diabetes mellitus without complications: Secondary | ICD-10-CM

## 2012-02-04 DIAGNOSIS — E785 Hyperlipidemia, unspecified: Secondary | ICD-10-CM

## 2012-02-04 LAB — LIPID PANEL
Cholesterol: 100 mg/dL (ref 0–200)
HDL: 43 mg/dL (ref >39)
Total CHOL/HDL Ratio: 2.3 RATIO

## 2012-02-04 LAB — GLUCOSE, CAPILLARY: Glucose-Capillary: 257 mg/dL — ABNORMAL HIGH (ref 70–99)

## 2012-02-04 LAB — HEMOGLOBIN A1C: Mean Plasma Glucose: 229 mg/dL — ABNORMAL HIGH (ref <117)

## 2012-02-04 MED ORDER — INSULIN ASPART 100 UNIT/ML ~~LOC~~ SOLN
0.0000 [IU] | Freq: Every day | SUBCUTANEOUS | Status: DC
  Administered 2012-02-04: 3 [IU] via SUBCUTANEOUS

## 2012-02-04 MED ORDER — INSULIN GLARGINE 100 UNIT/ML ~~LOC~~ SOLN: 25.0000 [IU] | Freq: Every day | SUBCUTANEOUS | Status: DC

## 2012-02-04 MED ORDER — INSULIN GLARGINE 100 UNIT/ML ~~LOC~~ SOLN
25.0000 [IU] | Freq: Every day | SUBCUTANEOUS | Status: DC
  Administered 2012-02-04 – 2012-02-05 (×2): 25 [IU] via SUBCUTANEOUS

## 2012-02-04 MED ORDER — INSULIN ASPART 100 UNIT/ML ~~LOC~~ SOLN
0.0000 [IU] | Freq: Three times a day (TID) | SUBCUTANEOUS | Status: DC
  Administered 2012-02-04: 8 [IU] via SUBCUTANEOUS
  Administered 2012-02-04 – 2012-02-05 (×3): 3 [IU] via SUBCUTANEOUS

## 2012-02-04 NOTE — Progress Notes (Signed)
*  PRELIMINARY RESULTS* Vascular Ultrasound Carotid Duplex (Doppler) has been completed.  Preliminary findings: Bilaterally no significant ICA stenosis with antegrade vertebral flow.  Farrel Demark, RDMS, RVT  02/04/2012, 9:50 AM

## 2012-02-04 NOTE — Clinical Social Work Psychosocial (Signed)
     Clinical Social Work Department BRIEF PSYCHOSOCIAL ASSESSMENT 02/04/2012  Patient:  Katherine [name], Katherine [name]     Account Number:  [id number]     Admit date:  02/03/2012  Clinical Social Worker:  Peggyann Shoals  Date/Time:  02/04/2012 03:59 PM  Referred by:  RN  Date Referred:  02/04/2012 Referred for  Other - See comment   Other Referral:   Advanced Directives   Interview type:  Patient Other interview type:    PSYCHOSOCIAL DATA Living Status:  ALONE Admitted from facility:   Level of care:   Primary support name:  Paul Dykes [place] Primary support relationship to patient:  CHILD, ADULT Degree of support available:   Supportive    CURRENT CONCERNS Current Concerns  None Noted   Other Concerns:    SOCIAL WORK ASSESSMENT / PLAN CSW met with pt to address consult for Advanced Directives. CSW introduced herself and explained role of social work. CSW provided pt with Advanced Directive packet and explained it.    Pt shared that she lives alone and will be discharging home with home health services.    CSW will continue to follow during this admission. Please call if pt's would like to have Advanced Directives notarized.   Assessment/plan status:  Other - See comment Other assessment/ plan:   Advanced Directives   Information/referral to community resources:   Additional resources provided by [facility] at discharge.    PATIENTS/FAMILYS RESPONSE TO PLAN OF CARE: Pt was pleasant and alert/oriented. Pt thanked CSW for her intervention.
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
## 2012-02-04 NOTE — Progress Notes (Signed)
Inpatient Diabetes Program Recommendations  AACE/ADA: New Consensus Statement on Inpatient Glycemic Control (2013)  Target Ranges:  Prepandial:   less than 140 mg/dL      Peak postprandial:   less than 180 mg/dL (1-2 hours)      Critically ill patients:  140 - 180 mg/dL   Results for Katherine [name], Katherine [name] (MRN [id number]) as of 02/04/2012 13:15  Ref. Range 02/03/2012 20:00 02/03/2012 23:33 02/04/2012 03:51 02/04/2012 08:12 02/04/2012 12:08  Glucose-Capillary Latest Range: 70-99 mg/dL 147 (H) 829 (H) 562 (H) 260 (H) 290 (H)    Inpatient Diabetes Program Recommendations Insulin - Basal: restart Lantus 25 units  Diabetes Coordinator spoke with Dr. Isidoro Donning this morning concerning regimen for this patient.  Agree that her Lantus needs to be restarted.  Patients CBG was 63 day of admission therefore concern for recurring hypoglycemia.  Dr. Isidoro Donning is ordering that Amaryl not be given and patient should monitor her CBGs for a few days until follow up with her PCP.  A1C=9.6 so not consistent with recurring hypoglycemia.    Thank you  Piedad Climes RN,BSN,CDE Inpatient Diabetes Coordinator [phone] (team pager)

## 2012-02-04 NOTE — Evaluation (Signed)
Physical Therapy Evaluation Patient Details Name: Katherine [name] MRN: [id number] DOB: 1933-07-26 Today's Date: 02/04/2012 Time: 5366-4403 PT Time Calculation (min): 20 min  PT Assessment / Plan / Recommendation Clinical Impression  Pt is a  78 yo female with past medical history of diabetes, hypertension, dyslipidemia, and obesity, admitted for AMS secondary to (per pt's chart) blood glucose level in the 60's and generalized weakness. Pt presents to physical therapy evaluation with decreased mobility and decreased strength. Pt reports occasional ambulation out in the community prior to admission with her cane. Pt will benefit from physical therapy in the acute care setting to maximize mobility and independence prior to discharge home.    PT Assessment  Patient needs continued PT services    Follow Up Recommendations  Home health PT;Supervision - Intermittent    Barriers to Discharge        Equipment Recommendations  None recommended by PT    Recommendations for Other Services     Frequency Min 3X/week    Precautions / Restrictions Precautions Precautions: None Restrictions Weight Bearing Restrictions: No   Pertinent Vitals/Pain Pt's HR increased to 102 while ambulating in hall.      Mobility  Bed Mobility Bed Mobility: Supine to Sit;Sitting - Scoot to Edge of Bed Supine to Sit: HOB elevated;With rails;6: Modified independent (Device/Increase time) Sitting - Scoot to Edge of Bed: 6: Modified independent (Device/Increase time);With rail Details for Bed Mobility Assistance: Pt modified independent with bed mobility, requiring use of rails and increased time to perform task. Transfers Transfers: Sit to Stand;Stand to Sit Sit to Stand: 5: Supervision Stand to Sit: 5: Supervision Details for Transfer Assistance: Pt required verbal cueing for upright posture upon standing. Ambulation/Gait Ambulation/Gait Assistance: 4: Min guard Ambulation Distance (Feet): 75 Feet Assistive  device: Straight cane ("hurry cane") Ambulation/Gait Assistance Details: Pt requried verbal cueing for upright posture during ambulation and due to decreased gait speed. Gait Pattern: Step-through pattern;Decreased stride length;Lateral trunk lean to right;Lateral trunk lean to left;Decreased trunk rotation;Trunk flexed;Wide base of support Gait velocity: decreased gait speed Stairs: No    Exercises     PT Diagnosis: Difficulty walking;Abnormality of gait;Generalized weakness  PT Problem List: Decreased strength;Decreased activity tolerance;Decreased balance;Decreased mobility;Obesity PT Treatment Interventions: DME instruction;Gait training;Functional mobility training;Therapeutic activities;Therapeutic exercise;Balance training;Neuromuscular re-education;Patient/family education   PT Goals Acute Rehab PT Goals PT Goal Formulation: With patient Time For Goal Achievement: 02/18/12 Potential to Achieve Goals: Good Pt will go Sit to Stand: with modified independence PT Goal: Sit to Stand - Progress: Goal set today Pt will go Stand to Sit: with modified independence PT Goal: Stand to Sit - Progress: Goal set today Pt will Ambulate: >150 feet;with modified independence;with least restrictive assistive device PT Goal: Ambulate - Progress: Goal set today Pt will Perform Home Exercise Program: Independently PT Goal: Perform Home Exercise Program - Progress: Goal set today  Visit Information  Last PT Received On: 02/04/12 Assistance Needed: +1    Subjective Data  Subjective: "I haven't been out of bed since I have been here."   Prior Functioning  Home Living Lives With: Alone Available Help at Discharge: Family;Available PRN/intermittently (daughter lives near by but doesn't check in every day) Type of Home: House Home Access: Ramped entrance Home Layout: One level Bathroom Shower/Tub: Engineer, manufacturing systems: Standard Home Adaptive Equipment: Grab bars in shower;Walker -  rolling;Straight cane (Raised toilet seat without rails) Additional Comments: Pt uses cane on daily basis Prior Function Level of Independence: Needs assistance Needs Assistance: Artist  Driving: Yes Vocation: Retired Musician: No difficulties    Cognition  Overall Cognitive Status: Appears within functional limits for tasks assessed/performed Arousal/Alertness: Awake/alert Orientation Level: Appears intact for tasks assessed Behavior During Session: [facility] for tasks performed    Extremity/Trunk Assessment Right Upper Extremity Assessment RUE ROM/Strength/Tone: Deficits RUE ROM/Strength/Tone Deficits: RUE grossly 4/5 Left Upper Extremity Assessment LUE ROM/Strength/Tone: Deficits LUE ROM/Strength/Tone Deficits: LUE grossly 4/5 Right Lower Extremity Assessment RLE ROM/Strength/Tone: Deficits RLE ROM/Strength/Tone Deficits: hip flexion 4-/5, RLE grossly 4+/5 Left Lower Extremity Assessment LLE ROM/Strength/Tone: Deficits LLE ROM/Strength/Tone Deficits: LLE grossly 3+/5   Balance Balance Balance Assessed: No  End of Session PT - End of Session Equipment Utilized During Treatment: Gait belt Activity Tolerance: Patient tolerated treatment well;Patient limited by fatigue Patient left: in chair;with call bell/phone within reach;with nursing in room Nurse Communication: Mobility status  GP     Augusten Lipkin 02/04/2012, 8:47 AM

## 2012-02-04 NOTE — Evaluation (Signed)
I have read and agree with the below assessment and plan.   Elzie Knisley Helen Whitlow PT, DPT Pager: [phone] 

## 2012-02-04 NOTE — Care Management Note (Signed)
    Page 1 of 1   02/04/2012     3:17:17 PM   CARE MANAGEMENT NOTE 02/04/2012  Patient:  Katherine [name], Katherine [name]   Account Number:  [id number]  Date Initiated:  02/04/2012  Documentation initiated by:  Onnie Boer  Subjective/Objective Assessment:   PT WAS ADMITTED WITH HYPOGLYCEMIA     Action/Plan:   PROGRESSION OF CARE AND DISHCHARGE PLANNING   Anticipated DC Date:  02/04/2012   Anticipated DC Plan:  HOME W HOME HEALTH SERVICES      DC Planning Services  CM consult      Choice offered to / List presented to:  C-1 Patient        HH arranged  HH-1 RN  HH-2 PT      [facility] agency  [facility]   Status of service:  Completed, signed off Medicare Important Message given?   (If response is "NO", the following Medicare IM given date fields will be blank) Date Medicare IM given:   Date Additional Medicare IM given:    Discharge Disposition:  HOME W HOME HEALTH SERVICES  Per UR Regulation:  Reviewed for med. necessity/level of care/duration of stay  If discussed at Long Length of Stay Meetings, dates discussed:    Comments:  02/04/12 Onnie Boer, RN, BSN 1513 PT WAS ADMITTED WITH WEAKNESS AND HYPOGLYCEMIA.  PT IS TO BE DC'D TO HOME WITH HH AS ABOVE.  PT AND DAUGHTER ALSO GIVEN LIST OF PRIVATE CARE SITTERS FOR EXTRA SUPPORT AT HOME.

## 2012-02-04 NOTE — Progress Notes (Signed)
OT Cancellation Note  Treatment cancelled today due to patient receiving procedure or test: pt receiving dopplers at this time. Will check back later as schedule allows. Thank you. [name], OTR/L Pager: [phone] 02/04/2012    Katherine [name] 02/04/2012, 9:47 AM

## 2012-02-04 NOTE — Evaluation (Signed)
Occupational Therapy Evaluation and Discharge Patient Details Name: Katherine [name] MRN: [id number] DOB: November 04, 1933 Today's Date: 02/04/2012 Time: 5621-3086 OT Time Calculation (min): 11 min  OT Assessment / Plan / Recommendation Clinical Impression  Pt is a  78 yo female with past medical history of diabetes, hypertension, dyslipidemia, and obesity, admitted for AMS secondary to (per pt's chart) blood glucose level in the 60's and generalized weakness. Pt at baseline functioning with ADLs and with no further acute OT needs at this time. Will sign off.     OT Assessment  Patient does not need any further OT services    Follow Up Recommendations  No OT follow up    Barriers to Discharge      Equipment Recommendations  None recommended by OT    Recommendations for Other Services    Frequency       Precautions / Restrictions Precautions Precautions: None Restrictions Weight Bearing Restrictions: No   Pertinent Vitals/Pain Pt with no c/o pain    ADL  Eating/Feeding: Simulated;Independent Where Assessed - Eating/Feeding: Chair Grooming: Performed;Independent Where Assessed - Grooming: Supported standing (leans against sink) Upper Body Bathing: Simulated;Independent Where Assessed - Upper Body Bathing: Unsupported standing Lower Body Bathing: Simulated;Independent Where Assessed - Lower Body Bathing: Supported sit to stand (steadied self with bar) Lower Body Dressing: Performed;Modified independent Where Assessed - Lower Body Dressing: Unsupported sit to stand Toilet Transfer: Simulated;Modified independent Toilet Transfer Method: Sit to Barista: Comfort height toilet;Grab bars Toileting - Clothing Manipulation and Hygiene: Simulated;Independent Where Assessed - Toileting Clothing Manipulation and Hygiene: Sit to stand from 3-in-1 or toilet Tub/Shower Transfer: Simulated;Supervision/safety ((baseline)) Tub/Shower Transfer Method: Engineer, materials: Grab bars Equipment Used: Cane Transfers/Ambulation Related to ADLs: Pt Mod I with ambulation with cane  ADL Comments: pt at baseline functioning with all necessary equipment and to receive PT at d/c. no further OT needs    OT Diagnosis:    OT Problem List:   OT Treatment Interventions:     OT Goals    Visit Information  Last OT Received On: 02/04/12 Assistance Needed: +1    Subjective Data  Subjective: I would like to get home and take a bath Patient Stated Goal: Return home   Prior Functioning  Vision/Perception  Home Living Lives With: Alone Available Help at Discharge: Family;Available PRN/intermittently Type of Home: House Home Access: Ramped entrance Home Layout: One level Bathroom Shower/Tub: Engineer, manufacturing systems: Standard Home Adaptive Equipment: Grab bars in shower;Walker - rolling;Straight cane Additional Comments: Pt uses cane on daily basis Prior Function Level of Independence: Needs assistance Needs Assistance: Light Housekeeping Light Housekeeping: Maximal (uses cleaning service) Driving: Yes Vocation: Retired Musician: No difficulties Dominant Hand: Right      Cognition  Overall Cognitive Status: Appears within functional limits for tasks assessed/performed Arousal/Alertness: Awake/alert Orientation Level: Appears intact for tasks assessed Behavior During Session: [facility] for tasks performed    Extremity/Trunk Assessment Right Upper Extremity Assessment RUE ROM/Strength/Tone: WFL for tasks assessed RUE ROM/Strength/Tone Deficits: RUE grossly 4/5 RUE Sensation: WFL - Light Touch RUE Coordination: WFL - gross/fine motor Left Upper Extremity Assessment LUE ROM/Strength/Tone: WFL for tasks assessed LUE ROM/Strength/Tone Deficits: LUE grossly 4/5 LUE Sensation: WFL - Light Touch LUE Coordination: WFL - gross/fine motor Right Lower Extremity Assessment RLE ROM/Strength/Tone: Deficits RLE  ROM/Strength/Tone Deficits: hip flexion 4-/5, RLE grossly 4+/5 Left Lower Extremity Assessment LLE ROM/Strength/Tone: Deficits LLE ROM/Strength/Tone Deficits: LLE grossly 3+/5   Mobility  Shoulder Instructions  Bed Mobility Bed  Mobility: Supine to Sit;Sitting - Scoot to Edge of Bed Supine to Sit: HOB elevated;With rails;6: Modified independent (Device/Increase time) Sitting - Scoot to Edge of Bed: 6: Modified independent (Device/Increase time);With rail Details for Bed Mobility Assistance: Pt modified independent with bed mobility, requriing use of rails and inceased time to perform task. Transfers Sit to Stand: 6: Modified independent (Device/Increase time);From chair/3-in-1;With armrests Stand to Sit: 6: Modified independent (Device/Increase time);With armrests;To chair/3-in-1 Details for Transfer Assistance: good hand placement       Exercise     Balance Balance Balance Assessed: No   End of Session OT - End of Session Activity Tolerance: Patient tolerated treatment well Patient left: in chair;with call bell/phone within reach  GO     Katherine [name] 02/04/2012, 11:04 AM

## 2012-02-04 NOTE — Progress Notes (Signed)
Patient ID: Katherine [name]  female  OZH:[id number]    DOB: 04-10-1934    DOA: 02/03/2012  PCP: [name], MD  Briefly patient is a 78 year old female with past medical history of diabetes, hypertension, hyperlipidemia, obesity was admitted with transient confusion/disorientation, hypoglycemia with blood sugar in the 60s.  Subjective: This morning patient seen and examined, alert, oriented and back to her baseline. Blood sugars are uncontrolled and elevated in 200s, highest at 312 last night.  Objective: Weight change:  No intake or output data in the 24 hours ending 02/04/12 1231 Blood pressure 136/43, pulse 56, temperature 98.2 F (36.8 C), temperature source Oral, resp. rate 20, height 5\' 4"  (1.626 m), weight 133 kg (293 lb 3.4 oz), SpO2 96.00%.  Physical Exam: General: Alert and awake, oriented x3, not in any acute distress. HEENT: anicteric sclera, pupils reactive to light and accommodation, EOMI CVS: S1-S2 clear, no murmur rubs or gallops Chest: clear to auscultation bilaterally, no wheezing, rales or rhonchi Abdomen: soft nontender, nondistended, normal bowel sounds, no organomegaly Extremities: no cyanosis, clubbing or edema noted bilaterally Neuro: Cranial nerves II-XII intact, no focal neurological deficits  Lab Results: Basic Metabolic Panel:  Lab 02/03/12 6295 02/03/12 0805  NA -- 140  K -- 3.9  CL -- 101  CO2 -- 24  GLUCOSE -- 130*  BUN -- 27*  CREATININE 1.18* 1.30*  CALCIUM -- 10.3  MG -- --  PHOS -- --   Liver Function Tests:  Lab 02/03/12 0805  AST 24  ALT 19  ALKPHOS 107  BILITOT 0.6  PROT 8.0  ALBUMIN 3.8  CBC:  Lab 02/03/12 1445 02/03/12 0805  WBC 12.1* 11.7*  NEUTROABS -- 10.3*  HGB 12.8 14.3  HCT 37.5 41.4  MCV 80.6 80.9  PLT 192 199   Cardiac Enzymes:  Lab 02/03/12 0805  CKTOTAL --  CKMB --  CKMBINDEX --  TROPONINI <0.30   BNP: No components found with this basename: POCBNP:2 CBG:  Lab 02/04/12 1208 02/04/12 0812 02/04/12  0351 02/03/12 2333 02/03/12 2000  GLUCAP 290* 260* 294* 312* 285*     Micro Results: No results found for this or any previous visit (from the past 240 hour(s)).  Studies/Results: Dg Chest 2 View  02/03/2012    IMPRESSION: No acute findings.   Original Report Authenticated By: Reola Calkins, M.D.    Ct Head Wo Contrast  02/03/2012 IMPRESSION:  1. No acute intracranial abnormality. 2.  Sinus disease.   Original Report Authenticated By: Consuello Bossier, M.D.     Medications: Scheduled Meds:   . sodium chloride   Intravenous STAT  . amLODipine  10 mg Oral Daily  . aspirin  300 mg Rectal Daily   Or  . aspirin  325 mg Oral Daily  . atorvastatin  20 mg Oral q1800  . carvedilol  25 mg Oral BID WC  . cloNIDine  0.2 mg Oral BID  . enoxaparin  40 mg Subcutaneous Q24H  . insulin aspart  0-15 Units Subcutaneous TID WC  . insulin aspart  0-5 Units Subcutaneous QHS  . insulin glargine  25 Units Subcutaneous Daily  . latanoprost  1 drop Both Eyes QHS   Continuous Infusions:   . sodium chloride       Assessment/Plan: Principal Problem:  *Altered mental status:  Likely secondary to hypoglycemia. Question if she had a hypoglycemic seizure, does not have any focal neurological deficits. CT head negative on admission: Resolved    Active Problems:  Hypoglycemia with  uncontrolled diabetes mellitus: Likely hypoglycemia was due to oral hypoglycemic. Patient is on Lantus 30 units qhs, Januvia 100 mg daily, Amaryl 4 mg BID (likely the culprit) - Lantus and all oral hypoglycemics were held yesterday, now blood sugars uncontrolled and elevated - I have restarted Lantus but on 25 units (per patient she usually takes 25 units at home when [facility] are running low in a.m.), Sliding scale - check HbA1C.  - Restart Januvia tomorrow if she is running elevated BS again but no amaryl.  - Patient lives alone and feels more comfortable staying overnight for BS stability.   DM (diabetes mellitus): see #2    HTN (hypertension): stable but on lower side - Hold Lisinopril-HCTZ, cont clonidine, norvsac and BB   Dyslipidemia: cont lipitor   Morbid obesity: counseled on diet control and weight control   CKD (chronic kidney disease) stage 2, GFR 60-89 ml/min  DVT Prophylaxis: lovenox  Code Status:   Disposition: tomorrow if BS are stable. Will add home PT and RN for f/u    LOS: 1 day   Amery Vandenbos M.D. [facility] 02/04/2012, 12:31 PM Pager: [phone]  If 7PM-7AM, please contact night-coverage [email or web address] Password [email or web address]

## 2012-02-05 DIAGNOSIS — N179 Acute kidney failure, unspecified: Secondary | ICD-10-CM

## 2012-02-05 DIAGNOSIS — I517 Cardiomegaly: Secondary | ICD-10-CM

## 2012-02-05 LAB — GLUCOSE, CAPILLARY
Glucose-Capillary: 189 mg/dL — ABNORMAL HIGH (ref 70–99)
Glucose-Capillary: 181 mg/dL — ABNORMAL HIGH (ref 70–99)

## 2012-02-05 MED ORDER — SITAGLIPTIN PHOSPHATE 50 MG PO TABS: 50.0000 mg | ORAL_TABLET | Freq: Every day | ORAL | Status: DC

## 2012-02-05 NOTE — Discharge Summary (Signed)
Henry Demeritt MRN: [id number] DOB/AGE: 08-03-1933 78 y.o.  Admit date: 02/03/2012 Discharge date: 02/05/2012  Primary Care Physician:  [name], MD   Discharge Diagnoses:   Patient Active Problem List  Diagnosis  . Altered mental status  . Hypoglycemia  . DM (diabetes mellitus)  . HTN (hypertension)  . Dyslipidemia  . Morbid obesity  . CKD (chronic kidney disease) stage 2, GFR 60-89 ml/min    DISCHARGE MEDICATION: Medication List  As of 02/05/2012  7:19 PM   STOP taking these medications         glimepiride 4 MG tablet      losartan-hydrochlorothiazide 100-25 MG per tablet         TAKE these medications         amLODipine 10 MG tablet   Commonly known as: NORVASC   Take 10 mg by mouth daily.      aspirin EC 81 MG tablet   Take 81 mg by mouth daily.      carvedilol 25 MG tablet   Commonly known as: COREG   Take 25 mg by mouth 2 (two) times daily with a meal.      cloNIDine 0.2 MG tablet   Commonly known as: CATAPRES   Take 0.2 mg by mouth 2 (two) times daily.      insulin glargine 100 UNIT/ML injection   Commonly known as: LANTUS   Inject 25 Units into the skin at bedtime.      latanoprost 0.005 % ophthalmic solution   Commonly known as: XALATAN   Place 1 drop into both eyes at bedtime.      rosuvastatin 10 MG tablet   Commonly known as: CRESTOR   Take 10 mg by mouth daily.      sitaGLIPtin 50 MG tablet   Commonly known as: JANUVIA   Take 1 tablet (50 mg total) by mouth daily.              Consults:     SIGNIFICANT DIAGNOSTIC STUDIES:  Dg Chest 2 View  02/03/2012  *RADIOLOGY REPORT*  Clinical Data: Weakness and difficulty with coordination. Hypoglycemia.  CHEST - 2 VIEW  Comparison: 10/16/2009  Findings: The lungs are clear.  No edema or infiltrates.  Stable chronic elevation of the right hemidiaphragm.  Heart size is normal.  IMPRESSION: No acute findings.   Original Report Authenticated By: Reola Calkins, M.D.    Ct Head Wo  Contrast  02/03/2012  *RADIOLOGY REPORT*  Clinical Data: Confusion.  Diabetes.  Hypertension.  CT HEAD WITHOUT CONTRAST  Technique:  Contiguous axial images were obtained from the base of the skull through the vertex without contrast.  Comparison: None.  Findings: Bone windows demonstrate mucosal thickening of ethmoid air cells. Clear mastoid air cells.  Aerated petrous apices.  Soft tissue windows demonstrate mild low density in the periventricular white matter likely related to small vessel disease. No  mass lesion, hemorrhage, hydrocephalus, acute infarct, intra-axial, or extra-axial fluid collection.  IMPRESSION:  1. No acute intracranial abnormality. 2.  Sinus disease.   Original Report Authenticated By: Consuello Bossier, M.D.      ECHO:   Left ventricle: Wall thickness was increased in a pattern of moderate LVH. Systolic function was normal. The estimated ejection fraction was in the range of 60% to 65%.    CAROTID DOPPLER ULTRASOUND: No significant extracranial carotid artery stenosis demonstrated. Vertebrals are patent with antegrade flow.     BRIEF ADMITTING H & P: Patient is a 78 year old  African American female with a past medical history of diabetes, hypertension, dyslipidemia, obesity who presented to the ED with the above-noted complaints. On the morning of admission she woke up and noticed that she was incontinent, she was sweating, upon getting up and ambulating she had some difficulty with coordination. She also complained of generalized weakness. She tried to call for help-however had a hard time dialing 911. She was subsequently evaluated by EMS and was found to be hypoglycemic with a blood sugars in the 60s-and was given oral glucose. At the time of her evaluation in the emergency room all of the above noted complaints have completely resolved. Patient claied that she was back to her usual baseline. Please refer to the H&P for further details of the history of present illness  the   Hospital Course:  Present on Admission:  .Altered mental status: Secondary to hypoglycemia. Completely resolved.  .Hypoglycemia: This is likely secondary to her renal function affect on insulin, Amaryl and Januvia. The patient had been having blood sugars that have range below 200. I've instructed the patient to only start her Januvia if the blood sugars are consistently above 200. Additionally the Amaryl has been discontinued.   .DM (diabetes mellitus): See above   . Acute on CKD (chronic kidney disease) stage 2, GFR 60-89 ml/min: On admission the patient had an acute kidney injury on top of her chronic kidney disease. It is felt that this was likely secondary to the effects of her Hyzaar. This was discontinued and the patient's kidney function is improved. I do not recommend restarting an ACE inhibitor or ACE receptor blocker   .HTN (hypertension): Patient's blood pressures are relatively well controlled on Norvasc and clonidine.   Condition at the time of discharge: Good    Disposition and Follow-up:  Patient is being discharged home on the above medications. Home health services including both physical therapy and RN services as well as patient and I've asked that our services check her blood sugars to ensure no hypoglycemia. Discharge Orders    Future Orders Please Complete By Expires   Diet Carb Modified      Diet - low sodium heart healthy      Discharge instructions      Comments:   1) Please take Lantus 25units at bed time, monitor BS-AM and HS, if running high above 200, then restart Januvia. Please donot take Amaryl (Glimeperide).  2) Also Hold your BP med- Hyzaar (losartan-HCTZ) for a few days until you see your primary PCP, Dr Chilton Si.   Activity as tolerated - No restrictions      Discharge instructions      Comments:   Check BS 3 x day. If greater than 200 then restart Januvia 50 mg daily.      DISCHARGE EXAM:  General: Alert, awake, oriented x3, in no acute  distress. Vital Signs:Blood pressure 130/47, pulse 58, temperature 98.1 F (36.7 C), temperature source Oral, resp. rate 18, height 5\' 4"  (1.626 m), weight 133 kg (293 lb 3.4 oz), SpO2 100.00%. HEENT: [place]/AT PEERL, EOMI  Heart: Regular rate and rhythm, without murmurs, rubs, gallops, PMI non-displaced, no heaves or thrills on palpation.  Lungs: Clear to auscultation.  Abdomen: Soft, nontender, nondistended, positive bowel sounds, no masses no hepatosplenomegaly noted..  Neuro: No focal neurological deficits noted cranial nerves II through XII grossly intact.  Musculoskeletal: No warm swelling or erythema around joints, no spinal tenderness noted    Basename 02/03/12 1445 02/03/12 0805  NA -- 140  K -- 3.9  CL -- 101  CO2 -- 24  GLUCOSE -- 130*  BUN -- 27*  CREATININE 1.18* 1.30*  CALCIUM -- 10.3  MG -- --  PHOS -- --    Basename 02/03/12 0805  AST 24  ALT 19  ALKPHOS 107  BILITOT 0.6  PROT 8.0  ALBUMIN 3.8   No results found for this basename: LIPASE:2,AMYLASE:2 in the last 72 hours  Basename 02/03/12 1445 02/03/12 0805  WBC 12.1* 11.7*  NEUTROABS -- 10.3*  HGB 12.8 14.3  HCT 37.5 41.4  MCV 80.6 80.9  PLT 192 199   Total time for discharge process including face-to-face time and decision making greater than 30 minutes Signed: MATTHEWS,MICHELLE A. 02/05/2012, 7:19 PM

## 2012-02-05 NOTE — Clinical Social Work Note (Signed)
CSW met with to follow up on Advanced Directives. Pt shared that she had not read it over and that her daughter stated that she may already have one with her will that was drawn up a couple of years ago. Pt stated that she would like to follow up with that prior to having Advanced Directives notarized. Pt has the documents for her review at a later date. CSW is signing off as no further needs indentified. Pt to discharge home with home health services.   Katherine [name], MSW, Katherine [name] [phone]

## 2012-02-05 NOTE — Progress Notes (Signed)
Physical Therapy Treatment Patient Details Name: Katherine [name] MRN: [id number] DOB: 02-Jul-1933 Today's Date: 02/05/2012 Time: 4098-1191 PT Time Calculation (min): 20 min  PT Assessment / Plan / Recommendation Comments on Treatment Session  Pt demonstrated an understanding of her HEP and was motivated to ambulate.  Pt ready to d/c home today and waiting for MD.     Follow Up Recommendations  Home health PT;Supervision - Intermittent    Barriers to Discharge        Equipment Recommendations  None recommended by PT    Recommendations for Other Services    Frequency Min 3X/week   Plan Discharge plan remains appropriate;Frequency remains appropriate    Precautions / Restrictions Restrictions Weight Bearing Restrictions: No   Pertinent Vitals/Pain 3/10 soreness in L LE    Mobility  Bed Mobility Bed Mobility: Supine to Sit;Sitting - Scoot to Edge of Bed Supine to Sit: HOB elevated;With rails;6: Modified independent (Device/Increase time) Sitting - Scoot to Edge of Bed: 6: Modified independent (Device/Increase time);With rail Details for Bed Mobility Assistance: Pt modified independent with bed mobility, requriing use of rails and inceased time to perform task. Transfers Transfers: Sit to Stand;Stand to Sit Sit to Stand: 6: Modified independent (Device/Increase time);From chair/3-in-1;With armrests Stand to Sit: 6: Modified independent (Device/Increase time);With armrests;To chair/3-in-1 Details for Transfer Assistance: Uses arms for safety and increased time with transfer. Ambulation/Gait Ambulation/Gait Assistance: 6: Modified independent (Device/Increase time) Ambulation Distance (Feet): 75 Feet Assistive device: Straight cane Ambulation/Gait Assistance Details: Pt required minimal cueing during ambulation and had decreased gait speed. Gait Pattern: Step-through pattern;Decreased stride length;Lateral trunk lean to right;Lateral trunk lean to left;Wide base of support Gait  velocity: decreased gait speed    Exercises General Exercises - Lower Extremity Gluteal Sets: Seated;10 reps Long Arc Quad: AROM;Strengthening;Both;10 reps;Seated Hip ABduction/ADduction: AROM;Strengthening;Both;10 reps;Seated Hip Flexion/Marching: AROM;Strengthening;Both;10 reps;Seated   PT Diagnosis:    PT Problem List:   PT Treatment Interventions:     PT Goals Acute Rehab PT Goals PT Goal Formulation: With patient Time For Goal Achievement: 02/18/12 Potential to Achieve Goals: Good Pt will go Sit to Stand: with modified independence PT Goal: Sit to Stand - Progress: Met Pt will go Stand to Sit: with modified independence PT Goal: Stand to Sit - Progress: Met Pt will Ambulate: >150 feet;with modified independence;with least restrictive assistive device PT Goal: Ambulate - Progress: Progressing toward goal Pt will Perform Home Exercise Program: Independently PT Goal: Perform Home Exercise Program - Progress: Progressing toward goal  Visit Information  Last PT Received On: 02/05/12 Assistance Needed: +1    Subjective Data  Subjective: Pt states she is ready to go home and hopes the doctor would be in soon to see her. Patient Stated Goal: Ready to go home   Cognition  Overall Cognitive Status: Appears within functional limits for tasks assessed/performed Arousal/Alertness: Awake/alert Orientation Level: Appears intact for tasks assessed Behavior During Session: [facility] for tasks performed    Balance     End of Session PT - End of Session Equipment Utilized During Treatment: Gait belt Activity Tolerance: Patient tolerated treatment well Patient left: in bed;with family/visitor present;with call bell/phone within reach Nurse Communication: Mobility status   GP     DITOMMASO, AMY 02/05/2012, 1:33 PM Amy DiTommaso, SPT  [place], PT DPT [phone]

## 2012-02-05 NOTE — Progress Notes (Signed)
  [name] has been performed.  Daymien Goth 02/05/2012, 10:55 AM 

## 2012-09-23 ENCOUNTER — Other Ambulatory Visit: Payer: Self-pay

## 2012-09-23 MED ORDER — SITAGLIPTIN PHOSPHATE 50 MG PO TABS: 50.0000 mg | ORAL_TABLET | Freq: Every day | ORAL | Status: AC

## 2012-11-06 ENCOUNTER — Other Ambulatory Visit: Payer: Self-pay

## 2012-11-06 DIAGNOSIS — Z1231 Encounter for screening mammogram for malignant neoplasm of breast: Secondary | ICD-10-CM

## 2012-12-15 ENCOUNTER — Ambulatory Visit
Admission: RE | Admit: 2012-12-15 | Discharge: 2012-12-15 | Disposition: A | Discharge status: Home / Self Care | Payer: Medicare Other | Source: Ambulatory Visit

## 2012-12-15 DIAGNOSIS — Z1231 Encounter for screening mammogram for malignant neoplasm of breast: Secondary | ICD-10-CM

## 2013-12-28 ENCOUNTER — Other Ambulatory Visit: Payer: Self-pay | Admitting: Orthopedic Surgery

## 2014-01-12 NOTE — Patient Instructions (Addendum)
Katherine [name]  01/12/2014                           YOUR PROCEDURE IS SCHEDULED ON: 01/20/14               ENTER THRU [facility] MAIN [facility]OS[facility]ITAL ENTRANCE AND                            FOLLOW  SIGNS TO SHORT STAY CENTER                 ARRIVE AT SHORT STAY AT: 11:00 AM               CALL THIS NUMBER IF ANY PROBLEMS THE DAY OF SURGERY :               [phone]                                REMEMBER:   Do not eat food  AFTER MIDNIGHT             MAY HAVE CLEAR LIQUIDS UNTIL 6 HRS BEFORE SURGERY (7:00 AM)    CLEAR LIQUID DIET   Foods Allowed                                                                     Foods Excluded  Coffee and tea, regular and decaf                             liquids that you cannot  Plain Jell-O in any flavor                                             see through such as: Fruit ices (not with fruit pulp)                                     milk, soups, orange juice  Iced Popsicles                                    All solid food Carbonated beverages, regular and diet                                    Cranberry, grape and apple juices Sports drinks like Gatorade Lightly seasoned clear broth or consume(fat free) Sugar, honey syrup  _____________________________________________________________________  _______________________________________________________________________                  Take these medicines the morning of surgery with               A SIPS OF WATER : CRESTOR / AMLODIPINE / CARVEDILOL / CLONIDINE / TAKE 1/2 DOSE INSULIN THE  PM BEFORE SURGERY        Do not wear jewelry, make-up   Do not wear lotions, powders, or perfumes.   Do not shave legs or underarms 12 hrs. before surgery (men may shave face)  Do not bring valuables to the hospital.  Contacts, dentures or bridgework may not be worn into surgery.  Leave suitcase in the car. After surgery it may be brought to your room.  For patients admitted to the hospital  more than one night, checkout time is            11:00 AM                                                         ________________________________________________________________________                                                                        [place] - PREPARING FOR SURGERY  Before surgery, you can play an important role.  Because skin is not sterile, your skin needs to be as free of germs as possible.  You can reduce the number of germs on your skin by washing with CHG (chlorahexidine gluconate) soap before surgery.  CHG is an antiseptic cleaner which kills germs and bonds with the skin to continue killing germs even after washing. Please DO NOT use if you have an allergy to CHG or antibacterial soaps.  If your skin becomes reddened/irritated stop using the CHG and inform your nurse when you arrive at Short Stay. Do not shave (including legs and underarms) for at least 48 hours prior to the first CHG shower.  You may shave your face. Please follow these instructions carefully:   1.  Shower with CHG Soap the night before surgery and the  morning of Surgery.   2.  If you choose to wash your hair, wash your hair first as usual with your  normal  Shampoo.   3.  After you shampoo, rinse your hair and body thoroughly to remove the  shampoo.                                         4.  Use CHG as you would any other liquid soap.  You can apply chg directly  to the skin and wash . Gently wash with scrungie or clean wascloth    5.  Apply the CHG Soap to your body ONLY FROM THE NECK DOWN.   Do not use on open                           Wound or open sores. Avoid contact with eyes, ears mouth and genitals (private parts).                        Genitals (private parts) with your normal soap.  6.  Wash thoroughly, paying special attention to the area where your surgery  will be performed.   7.  Thoroughly rinse your body with warm water from the neck down.   8.   DO NOT shower/wash with your normal soap after using and rinsing off  the CHG Soap .                9.  Pat yourself dry with a clean towel.             10.  Wear clean pajamas.             11.  Place clean sheets on your bed the night of your first shower and do not  sleep with pets.  Day of Surgery : Do not apply any lotions/deodorants the morning of surgery.  Please wear clean clothes to the hospital/surgery center.  FAILURE TO FOLLOW THESE INSTRUCTIONS MAY RESULT IN THE CANCELLATION OF YOUR SURGERY    PATIENT SIGNATURE_________________________________  ______________________________________________________________________     Katherine MireIncentive Spirometer  An incentive spirometer is a tool that can help keep your lungs clear and active. This tool measures how well you are filling your lungs with each breath. Taking long deep breaths may help reverse or decrease the chance of developing breathing (pulmonary) problems (especially infection) following:  A long period of time when you are unable to move or be active. BEFORE THE PROCEDURE   If the spirometer includes an indicator to show your best effort, your nurse or respiratory therapist will set it to a desired goal.  If possible, sit up straight or lean slightly forward. Try not to slouch.  Hold the incentive spirometer in an upright position. INSTRUCTIONS FOR USE  1. Sit on the edge of your bed if possible, or sit up as far as you can in bed or on a chair. 2. Hold the incentive spirometer in an upright position. 3. Breathe out normally. 4. Place the mouthpiece in your mouth and seal your lips tightly around it. 5. Breathe in slowly and as deeply as possible, raising the piston or the ball toward the top of the column. 6. Hold your breath for 3-5 seconds or for as long as possible. Allow the piston or ball to fall to the bottom of the column. 7. Remove the mouthpiece from your mouth and breathe out normally. 8. Rest for a few  seconds and repeat Steps 1 through 7 at least 10 times every 1-2 hours when you are awake. Take your time and take a few normal breaths between deep breaths. 9. The spirometer may include an indicator to show your best effort. Use the indicator as a goal to work toward during each repetition. 10. After each set of 10 deep breaths, practice coughing to be sure your lungs are clear. If you have an incision (the cut made at the time of surgery), support your incision when coughing by placing a pillow or rolled up towels firmly against it. Once you are able to get out of bed, walk around indoors and cough well. You may stop using the incentive spirometer when instructed by your caregiver.  RISKS AND COMPLICATIONS  Take your time so you do not get dizzy or light-headed.  If you are in pain, you may need to take or ask for pain medication before doing incentive spirometry. It is harder to take a deep breath if you are having pain. AFTER USE  Rest and breathe slowly and easily.  It can be  helpful to keep track of a log of your progress. Your caregiver can provide you with a simple table to help with this. If you are using the spirometer at home, follow these instructions: SEEK MEDICAL CARE IF:   You are having difficultly using the spirometer.  You have trouble using the spirometer as often as instructed.  Your pain medication is not giving enough relief while using the spirometer.  You develop fever of 100.5 F (38.1 C) or higher. SEEK IMMEDIATE MEDICAL CARE IF:   You cough up bloody sputum that had not been present before.  You develop fever of 102 F (38.9 C) or greater.  You develop worsening pain at or near the incision site. MAKE SURE YOU:   Understand these instructions.  Will watch your condition.  Will get help right away if you are not doing well or get worse. Document Released: 09/30/2006 Document Revised: 08/12/2011 Document Reviewed: 12/01/2006 Exit[employer] Patient  Information 2014 Exit[employer], [place].   ________________________________________________________________________  WHAT IS A BLOOD TRANSFUSION? Blood Transfusion Information  A transfusion is the replacement of blood or some of its parts. Blood is made up of multiple cells which provide different functions.  Red blood cells carry oxygen and are used for blood loss replacement.  White blood cells fight against infection.  Platelets control bleeding.  Plasma helps clot blood.  Other blood products are available for specialized needs, such as hemophilia or other clotting disorders. BEFORE THE TRANSFUSION  Who gives blood for transfusions?   Healthy volunteers who are fully evaluated to make sure their blood is safe. This is blood bank blood. Transfusion therapy is the safest it has ever been in the practice of medicine. Before blood is taken from a donor, a complete history is taken to make sure that person has no history of diseases nor engages in risky social behavior (examples are intravenous drug use or sexual activity with multiple partners). The donor's travel history is screened to minimize risk of transmitting infections, such as malaria. The donated blood is tested for signs of infectious diseases, such as HIV and hepatitis. The blood is then tested to be sure it is compatible with you in order to minimize the chance of a transfusion reaction. If you or a relative donates blood, this is often done in anticipation of surgery and is not appropriate for emergency situations. It takes many days to process the donated blood. RISKS AND COMPLICATIONS Although transfusion therapy is very safe and saves many lives, the main dangers of transfusion include:   Getting an infectious disease.  Developing a transfusion reaction. This is an allergic reaction to something in the blood you were given. Every precaution is taken to prevent this. The decision to have a blood transfusion has been considered  carefully by your caregiver before blood is given. Blood is not given unless the benefits outweigh the risks. AFTER THE TRANSFUSION  Right after receiving a blood transfusion, you will usually feel much better and more energetic. This is especially true if your red blood cells have gotten low (anemic). The transfusion raises the level of the red blood cells which carry oxygen, and this usually causes an energy increase.  The nurse administering the transfusion will monitor you carefully for complications. HOME CARE INSTRUCTIONS  No special instructions are needed after a transfusion. You may find your energy is better. Speak with your caregiver about any limitations on activity for underlying diseases you may have. SEEK MEDICAL CARE IF:   Your condition is not  improving after your transfusion.  You develop redness or irritation at the intravenous (IV) site. SEEK IMMEDIATE MEDICAL CARE IF:  Any of the following symptoms occur over the next 12 hours:  Shaking chills.  You have a temperature by mouth above 102 F (38.9 C), not controlled by medicine.  Chest, back, or muscle pain.  People around you feel you are not acting correctly or are confused.  Shortness of breath or difficulty breathing.  Dizziness and fainting.  You get a rash or develop hives.  You have a decrease in urine output.  Your urine turns a dark color or changes to pink, red, or brown. Any of the following symptoms occur over the next 10 days:  You have a temperature by mouth above 102 F (38.9 C), not controlled by medicine.  Shortness of breath.  Weakness after normal activity.  The white part of the eye turns yellow (jaundice).  You have a decrease in the amount of urine or are urinating less often.  Your urine turns a dark color or changes to pink, red, or brown. Document Released: 05/17/2000 Document Revised: 08/12/2011 Document Reviewed: 01/04/2008 [facility] Patient Information 2014 [place],  [place].  _______________________________________________________________________

## 2014-01-13 ENCOUNTER — Ambulatory Visit (HOSPITAL_COMMUNITY)
Admission: RE | Admit: 2014-01-13 | Discharge: 2014-01-13 | Disposition: A | Discharge status: Home / Self Care | Payer: Medicare Other | Source: Ambulatory Visit | Attending: Anesthesiology | Admitting: Anesthesiology

## 2014-01-13 ENCOUNTER — Encounter (HOSPITAL_COMMUNITY): Payer: Self-pay

## 2014-01-13 ENCOUNTER — Encounter (HOSPITAL_COMMUNITY)
Admission: RE | Admit: 2014-01-13 | Discharge: 2014-01-13 | Disposition: A | Discharge status: Home / Self Care | Payer: Medicare Other | Source: Ambulatory Visit | Attending: Orthopedic Surgery | Admitting: Orthopedic Surgery

## 2014-01-13 ENCOUNTER — Other Ambulatory Visit: Payer: Self-pay | Admitting: Surgical

## 2014-01-13 ENCOUNTER — Encounter (HOSPITAL_COMMUNITY): Payer: Self-pay | Admitting: Pharmacy Technician

## 2014-01-13 DIAGNOSIS — Z01818 Encounter for other preprocedural examination: Secondary | ICD-10-CM | POA: Insufficient documentation

## 2014-01-13 HISTORY — DX: Nocturia: R35.1

## 2014-01-13 HISTORY — DX: Sleep disorder, unspecified: G47.9

## 2014-01-13 LAB — URINE MICROSCOPIC-ADD ON

## 2014-01-13 LAB — CBC
HEMATOCRIT: 38.1 % (ref 36.0–46.0)
Hemoglobin: 12.8 g/dL (ref 12.0–15.0)
MCH: 26.7 pg (ref 26.0–34.0)
MCHC: 33.6 g/dL (ref 30.0–36.0)
MCV: 79.5 fL (ref 78.0–100.0)
Platelets: 211 10*3/uL (ref 150–400)
RBC: 4.79 MIL/uL (ref 3.87–5.11)
RDW: 15.2 % (ref 11.5–15.5)
WBC: 9.8 10*3/uL (ref 4.0–10.5)

## 2014-01-13 LAB — URINALYSIS, ROUTINE W REFLEX MICROSCOPIC
Bilirubin Urine: NEGATIVE
Glucose, UA: 100 mg/dL — AB
Ketones, ur: NEGATIVE mg/dL
Leukocytes, UA: NEGATIVE
Nitrite: NEGATIVE
Specific Gravity, Urine: 1.016 (ref 1.005–1.030)
UROBILINOGEN UA: 1 mg/dL (ref 0.0–1.0)
pH: 6.5 (ref 5.0–8.0)

## 2014-01-13 LAB — COMPREHENSIVE METABOLIC PANEL
ALBUMIN: 3.1 g/dL — AB (ref 3.5–5.2)
ALT: 17 U/L (ref 0–35)
AST: 21 U/L (ref 0–37)
Alkaline Phosphatase: 177 U/L — ABNORMAL HIGH (ref 39–117)
Anion gap: 10 (ref 5–15)
BUN: 28 mg/dL — ABNORMAL HIGH (ref 6–23)
CALCIUM: 10 mg/dL (ref 8.4–10.5)
CO2: 29 mEq/L (ref 19–32)
CREATININE: 1.43 mg/dL — AB (ref 0.50–1.10)
Chloride: 108 mEq/L (ref 96–112)
GFR calc Af Amer: 40 mL/min — ABNORMAL LOW (ref >90)
GFR calc non Af Amer: 34 mL/min — ABNORMAL LOW (ref >90)
Glucose, Bld: 173 mg/dL — ABNORMAL HIGH (ref 70–99)
Potassium: 4.5 mEq/L (ref 3.7–5.3)
Sodium: 147 mEq/L (ref 137–147)
TOTAL PROTEIN: 7.7 g/dL (ref 6.0–8.3)
Total Bilirubin: 0.5 mg/dL (ref 0.3–1.2)

## 2014-01-13 LAB — PROTIME-INR
INR: 1.02 (ref 0.00–1.49)
PROTHROMBIN TIME: 13.4 s (ref 11.6–15.2)

## 2014-01-13 LAB — APTT: aPTT: 35 seconds (ref 24–37)

## 2014-01-13 LAB — SURGICAL PCR SCREEN
MRSA, PCR: NEGATIVE
STAPHYLOCOCCUS AUREUS: NEGATIVE

## 2014-01-13 LAB — TSH: TSH: 2.85 u[IU]/mL (ref 0.350–4.500)

## 2014-01-13 LAB — HEMOGLOBIN A1C
Hgb A1c MFr Bld: 8.6 % — ABNORMAL HIGH (ref <5.7)
Mean Plasma Glucose: 200 mg/dL — ABNORMAL HIGH (ref <117)

## 2014-01-13 NOTE — Progress Notes (Signed)
CMET / UA faxed to Dr.Aluisio 
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
## 2014-01-13 NOTE — Progress Notes (Signed)
All labs faxed to Dr. Nila NephewEdwin Green per his request

## 2014-01-13 NOTE — Progress Notes (Signed)
01/13/14 1045  OBSTRUCTIVE SLEEP APNEA  Have you ever been diagnosed with sleep apnea through a sleep study? No  Do you snore loudly (loud enough to be heard through closed doors)?  0  Do you often feel tired, fatigued, or sleepy during the daytime? 1  Has anyone observed you stop breathing during your sleep? 0  Do you have, or are you being treated for high blood pressure? 1  BMI more than 35 kg/m2? 1  Age over 78 years old? 1  Neck circumference greater than 40 cm/16 inches? 0  Gender: 0  Obstructive Sleep Apnea Score 4  Score 4 or greater  Results sent to PCP

## 2014-01-20 ENCOUNTER — Inpatient Hospital Stay (HOSPITAL_COMMUNITY): Payer: Medicare Other | Admitting: Anesthesiology

## 2014-01-20 ENCOUNTER — Encounter (HOSPITAL_COMMUNITY): Payer: Self-pay | Admitting: *Deleted

## 2014-01-20 ENCOUNTER — Encounter (HOSPITAL_COMMUNITY)
Admission: RE | Disposition: A | Discharge status: Skilled Nursing Facility | Payer: Self-pay | Source: Ambulatory Visit | Attending: Orthopedic Surgery

## 2014-01-20 ENCOUNTER — Inpatient Hospital Stay (HOSPITAL_COMMUNITY)
Admission: RE | Admit: 2014-01-20 | Discharge: 2014-01-25 | DRG: 470 | Disposition: A | Discharge status: Skilled Nursing Facility | Payer: Medicare Other | Source: Ambulatory Visit | Attending: Orthopedic Surgery | Admitting: Orthopedic Surgery

## 2014-01-20 ENCOUNTER — Encounter (HOSPITAL_COMMUNITY): Payer: Medicare Other | Admitting: Anesthesiology

## 2014-01-20 DIAGNOSIS — N183 Chronic kidney disease, stage 3 unspecified: Secondary | ICD-10-CM | POA: Diagnosis present

## 2014-01-20 DIAGNOSIS — Z7982 Long term (current) use of aspirin: Secondary | ICD-10-CM

## 2014-01-20 DIAGNOSIS — R509 Fever, unspecified: Secondary | ICD-10-CM | POA: Diagnosis not present

## 2014-01-20 DIAGNOSIS — Z6841 Body Mass Index (BMI) 40.0 and over, adult: Secondary | ICD-10-CM | POA: Diagnosis not present

## 2014-01-20 DIAGNOSIS — I129 Hypertensive chronic kidney disease with stage 1 through stage 4 chronic kidney disease, or unspecified chronic kidney disease: Secondary | ICD-10-CM | POA: Diagnosis present

## 2014-01-20 DIAGNOSIS — D638 Anemia in other chronic diseases classified elsewhere: Secondary | ICD-10-CM | POA: Diagnosis present

## 2014-01-20 DIAGNOSIS — Z91010 Allergy to peanuts: Secondary | ICD-10-CM

## 2014-01-20 DIAGNOSIS — E785 Hyperlipidemia, unspecified: Secondary | ICD-10-CM | POA: Diagnosis present

## 2014-01-20 DIAGNOSIS — D573 Sickle-cell trait: Secondary | ICD-10-CM | POA: Diagnosis present

## 2014-01-20 DIAGNOSIS — Z96652 Presence of left artificial knee joint: Secondary | ICD-10-CM

## 2014-01-20 DIAGNOSIS — M1710 Unilateral primary osteoarthritis, unspecified knee: Secondary | ICD-10-CM | POA: Diagnosis present

## 2014-01-20 DIAGNOSIS — Z79899 Other long term (current) drug therapy: Secondary | ICD-10-CM

## 2014-01-20 DIAGNOSIS — M898X9 Other specified disorders of bone, unspecified site: Secondary | ICD-10-CM | POA: Diagnosis present

## 2014-01-20 DIAGNOSIS — M25469 Effusion, unspecified knee: Secondary | ICD-10-CM | POA: Diagnosis present

## 2014-01-20 DIAGNOSIS — M179 Osteoarthritis of knee, unspecified: Secondary | ICD-10-CM | POA: Diagnosis present

## 2014-01-20 DIAGNOSIS — E1129 Type 2 diabetes mellitus with other diabetic kidney complication: Secondary | ICD-10-CM | POA: Diagnosis present

## 2014-01-20 DIAGNOSIS — M1712 Unilateral primary osteoarthritis, left knee: Secondary | ICD-10-CM

## 2014-01-20 DIAGNOSIS — N179 Acute kidney failure, unspecified: Secondary | ICD-10-CM | POA: Diagnosis not present

## 2014-01-20 DIAGNOSIS — N189 Chronic kidney disease, unspecified: Secondary | ICD-10-CM

## 2014-01-20 DIAGNOSIS — Z87891 Personal history of nicotine dependence: Secondary | ICD-10-CM | POA: Diagnosis not present

## 2014-01-20 DIAGNOSIS — E1165 Type 2 diabetes mellitus with hyperglycemia: Secondary | ICD-10-CM | POA: Diagnosis present

## 2014-01-20 DIAGNOSIS — E7800 Pure hypercholesterolemia, unspecified: Secondary | ICD-10-CM | POA: Diagnosis present

## 2014-01-20 DIAGNOSIS — D72829 Elevated white blood cell count, unspecified: Secondary | ICD-10-CM | POA: Diagnosis not present

## 2014-01-20 DIAGNOSIS — M25569 Pain in unspecified knee: Secondary | ICD-10-CM | POA: Diagnosis present

## 2014-01-20 HISTORY — PX: TOTAL KNEE ARTHROPLASTY: SHX125

## 2014-01-20 LAB — TYPE AND SCREEN
ABO/RH(D): B POS
ANTIBODY SCREEN: NEGATIVE

## 2014-01-20 LAB — URINALYSIS, ROUTINE W REFLEX MICROSCOPIC
Bilirubin Urine: NEGATIVE
Glucose, UA: NEGATIVE mg/dL
Ketones, ur: NEGATIVE mg/dL
LEUKOCYTES UA: NEGATIVE
Nitrite: NEGATIVE
PH: 6.5 (ref 5.0–8.0)
Specific Gravity, Urine: 1.012 (ref 1.005–1.030)
Urobilinogen, UA: 1 mg/dL (ref 0.0–1.0)

## 2014-01-20 LAB — GLUCOSE, CAPILLARY
GLUCOSE-CAPILLARY: 153 mg/dL — AB (ref 70–99)
GLUCOSE-CAPILLARY: 174 mg/dL — AB (ref 70–99)
Glucose-Capillary: 165 mg/dL — ABNORMAL HIGH (ref 70–99)
Glucose-Capillary: 181 mg/dL — ABNORMAL HIGH (ref 70–99)

## 2014-01-20 LAB — URINE MICROSCOPIC-ADD ON

## 2014-01-20 SURGERY — ARTHROPLASTY, KNEE, TOTAL
Anesthesia: General | Site: Knee | Laterality: Left

## 2014-01-20 MED ORDER — BISACODYL 10 MG RE SUPP: 10.0000 mg | Freq: Every day | RECTAL | Status: DC | PRN

## 2014-01-20 MED ORDER — RIVAROXABAN 10 MG PO TABS
10.0000 mg | ORAL_TABLET | Freq: Every day | ORAL | Status: DC
  Administered 2014-01-21 – 2014-01-22 (×2): 10 mg via ORAL
  Filled 2014-01-20 (×3): qty 1

## 2014-01-20 MED ORDER — PROPOFOL 10 MG/ML IV BOLUS
INTRAVENOUS | Status: AC
Start: 2014-01-20 — End: 2014-01-20
  Filled 2014-01-20: qty 20

## 2014-01-20 MED ORDER — HYDROCHLOROTHIAZIDE 25 MG PO TABS
25.0000 mg | ORAL_TABLET | Freq: Every day | ORAL | Status: DC
  Administered 2014-01-21 – 2014-01-22 (×2): 25 mg via ORAL
  Filled 2014-01-20 (×3): qty 1

## 2014-01-20 MED ORDER — LATANOPROST 0.005 % OP SOLN
1.0000 [drp] | Freq: Every day | OPHTHALMIC | Status: DC
  Administered 2014-01-20 – 2014-01-24 (×5): 1 [drp] via OPHTHALMIC
  Filled 2014-01-20: qty 2.5

## 2014-01-20 MED ORDER — RIVAROXABAN 10 MG PO TABS: 10.0000 mg | ORAL_TABLET | Freq: Every day | ORAL | Status: DC

## 2014-01-20 MED ORDER — PROMETHAZINE HCL 25 MG/ML IJ SOLN: 6.2500 mg | INTRAMUSCULAR | Status: DC | PRN

## 2014-01-20 MED ORDER — METHOCARBAMOL 500 MG PO TABS: 500.0000 mg | ORAL_TABLET | Freq: Four times a day (QID) | ORAL | Status: DC | PRN

## 2014-01-20 MED ORDER — TRAMADOL HCL 50 MG PO TABS: 50.0000 mg | ORAL_TABLET | Freq: Four times a day (QID) | ORAL | Status: DC | PRN

## 2014-01-20 MED ORDER — INSULIN ASPART 100 UNIT/ML ~~LOC~~ SOLN
0.0000 [IU] | Freq: Three times a day (TID) | SUBCUTANEOUS | Status: DC
  Administered 2014-01-20: 3 [IU] via SUBCUTANEOUS
  Administered 2014-01-21: 5 [IU] via SUBCUTANEOUS
  Administered 2014-01-21: 8 [IU] via SUBCUTANEOUS
  Administered 2014-01-21: 3 [IU] via SUBCUTANEOUS
  Administered 2014-01-22: 5 [IU] via SUBCUTANEOUS
  Administered 2014-01-22: 3 [IU] via SUBCUTANEOUS
  Administered 2014-01-22: 8 [IU] via SUBCUTANEOUS
  Administered 2014-01-23: 3 [IU] via SUBCUTANEOUS
  Administered 2014-01-23: 5 [IU] via SUBCUTANEOUS
  Administered 2014-01-23: 3 [IU] via SUBCUTANEOUS
  Administered 2014-01-24 (×2): 2 [IU] via SUBCUTANEOUS
  Administered 2014-01-25: 3 [IU] via SUBCUTANEOUS

## 2014-01-20 MED ORDER — ROCURONIUM BROMIDE 100 MG/10ML IV SOLN
INTRAVENOUS | Status: DC | PRN
Start: 2014-01-20 — End: 2014-01-20
  Administered 2014-01-20: 30 mg via INTRAVENOUS

## 2014-01-20 MED ORDER — LIDOCAINE HCL (CARDIAC) 20 MG/ML IV SOLN
INTRAVENOUS | Status: AC
  Filled 2014-01-20: qty 5

## 2014-01-20 MED ORDER — ONDANSETRON HCL 4 MG/2ML IJ SOLN
INTRAMUSCULAR | Status: AC
  Administered 2014-01-20: 4 mg
  Filled 2014-01-20: qty 2

## 2014-01-20 MED ORDER — HYDROMORPHONE HCL PF 1 MG/ML IJ SOLN
INTRAMUSCULAR | Status: DC | PRN
  Administered 2014-01-20 (×2): 0.5 mg via INTRAVENOUS

## 2014-01-20 MED ORDER — DEXAMETHASONE 6 MG PO TABS
10.0000 mg | ORAL_TABLET | Freq: Every day | ORAL | Status: AC
  Administered 2014-01-21: 10 mg via ORAL
  Filled 2014-01-20: qty 1

## 2014-01-20 MED ORDER — SODIUM CHLORIDE 0.9 % IJ SOLN
INTRAMUSCULAR | Status: DC | PRN
  Administered 2014-01-20: 30 mL

## 2014-01-20 MED ORDER — LOSARTAN POTASSIUM-HCTZ 100-25 MG PO TABS: 1.0000 | ORAL_TABLET | Freq: Every morning | ORAL | Status: DC

## 2014-01-20 MED ORDER — METHOCARBAMOL 500 MG PO TABS
500.0000 mg | ORAL_TABLET | Freq: Four times a day (QID) | ORAL | Status: DC | PRN
  Administered 2014-01-20 – 2014-01-25 (×6): 500 mg via ORAL
  Filled 2014-01-20 (×6): qty 1

## 2014-01-20 MED ORDER — HYDROMORPHONE HCL PF 1 MG/ML IJ SOLN: 0.2500 mg | INTRAMUSCULAR | Status: DC | PRN

## 2014-01-20 MED ORDER — ONDANSETRON HCL 4 MG/2ML IJ SOLN
INTRAMUSCULAR | Status: AC
  Filled 2014-01-20: qty 2

## 2014-01-20 MED ORDER — ONDANSETRON HCL 4 MG PO TABS: 4.0000 mg | ORAL_TABLET | Freq: Four times a day (QID) | ORAL | Status: DC | PRN

## 2014-01-20 MED ORDER — ACETAMINOPHEN 500 MG PO TABS
1000.0000 mg | ORAL_TABLET | Freq: Four times a day (QID) | ORAL | Status: AC
  Administered 2014-01-20 – 2014-01-21 (×4): 1000 mg via ORAL
  Filled 2014-01-20 (×4): qty 2

## 2014-01-20 MED ORDER — SODIUM CHLORIDE 0.9 % IR SOLN
Status: DC | PRN
  Administered 2014-01-20: 1000 mL

## 2014-01-20 MED ORDER — ACETAMINOPHEN 325 MG PO TABS: 650.0000 mg | ORAL_TABLET | Freq: Four times a day (QID) | ORAL | Status: DC | PRN

## 2014-01-20 MED ORDER — MORPHINE SULFATE 2 MG/ML IJ SOLN: 1.0000 mg | INTRAMUSCULAR | Status: DC | PRN

## 2014-01-20 MED ORDER — GLYCOPYRROLATE 0.2 MG/ML IJ SOLN
INTRAMUSCULAR | Status: AC
  Filled 2014-01-20: qty 4

## 2014-01-20 MED ORDER — MENTHOL 3 MG MT LOZG
1.0000 | LOZENGE | OROMUCOSAL | Status: DC | PRN
  Filled 2014-01-20: qty 9

## 2014-01-20 MED ORDER — DOCUSATE SODIUM 100 MG PO CAPS
100.0000 mg | ORAL_CAPSULE | Freq: Two times a day (BID) | ORAL | Status: DC
  Administered 2014-01-20 – 2014-01-25 (×11): 100 mg via ORAL

## 2014-01-20 MED ORDER — METOCLOPRAMIDE HCL 5 MG/ML IJ SOLN
5.0000 mg | Freq: Three times a day (TID) | INTRAMUSCULAR | Status: DC | PRN
  Administered 2014-01-20: 10 mg via INTRAVENOUS
  Filled 2014-01-20: qty 2

## 2014-01-20 MED ORDER — FENTANYL CITRATE 0.05 MG/ML IJ SOLN
INTRAMUSCULAR | Status: AC
  Filled 2014-01-20: qty 5

## 2014-01-20 MED ORDER — HYDROMORPHONE HCL PF 2 MG/ML IJ SOLN
INTRAMUSCULAR | Status: AC
  Filled 2014-01-20: qty 1

## 2014-01-20 MED ORDER — MIDAZOLAM HCL 5 MG/5ML IJ SOLN
INTRAMUSCULAR | Status: DC | PRN
  Administered 2014-01-20: 1 mg via INTRAVENOUS

## 2014-01-20 MED ORDER — DEXAMETHASONE SODIUM PHOSPHATE 10 MG/ML IJ SOLN
INTRAMUSCULAR | Status: AC
  Filled 2014-01-20: qty 1

## 2014-01-20 MED ORDER — DEXAMETHASONE SODIUM PHOSPHATE 10 MG/ML IJ SOLN
10.0000 mg | Freq: Every day | INTRAMUSCULAR | Status: AC
  Filled 2014-01-20: qty 1

## 2014-01-20 MED ORDER — PHENOL 1.4 % MT LIQD: 1.0000 | OROMUCOSAL | Status: DC | PRN

## 2014-01-20 MED ORDER — LIDOCAINE HCL (CARDIAC) 20 MG/ML IV SOLN
INTRAVENOUS | Status: DC | PRN
  Administered 2014-01-20: 100 mg via INTRAVENOUS

## 2014-01-20 MED ORDER — OXYCODONE HCL 5 MG PO TABS
5.0000 mg | ORAL_TABLET | ORAL | Status: DC | PRN
  Administered 2014-01-20 (×2): 5 mg via ORAL
  Administered 2014-01-21 (×4): 10 mg via ORAL
  Administered 2014-01-21: 5 mg via ORAL
  Administered 2014-01-22 (×2): 10 mg via ORAL
  Administered 2014-01-22 – 2014-01-23 (×2): 5 mg via ORAL
  Administered 2014-01-23: 10 mg via ORAL
  Administered 2014-01-23 – 2014-01-24 (×3): 5 mg via ORAL
  Administered 2014-01-24 (×3): 10 mg via ORAL
  Administered 2014-01-25 (×2): 5 mg via ORAL
  Administered 2014-01-25: 10 mg via ORAL
  Filled 2014-01-20: qty 2
  Filled 2014-01-20: qty 1
  Filled 2014-01-20 (×3): qty 2
  Filled 2014-01-20: qty 1
  Filled 2014-01-20: qty 2
  Filled 2014-01-20 (×4): qty 1
  Filled 2014-01-20 (×3): qty 2
  Filled 2014-01-20: qty 1
  Filled 2014-01-20: qty 2
  Filled 2014-01-20 (×2): qty 1
  Filled 2014-01-20 (×2): qty 2
  Filled 2014-01-20: qty 1

## 2014-01-20 MED ORDER — BUPIVACAINE LIPOSOME 1.3 % IJ SUSP
20.0000 mL | Freq: Once | INTRAMUSCULAR | Status: AC
  Administered 2014-01-20: 20 mL
  Filled 2014-01-20: qty 20

## 2014-01-20 MED ORDER — TRANEXAMIC ACID 100 MG/ML IV SOLN
1000.0000 mg | INTRAVENOUS | Status: AC
  Administered 2014-01-20: 1000 mg via INTRAVENOUS
  Filled 2014-01-20 (×2): qty 10

## 2014-01-20 MED ORDER — CEFAZOLIN SODIUM-DEXTROSE 2-3 GM-% IV SOLR
2.0000 g | Freq: Four times a day (QID) | INTRAVENOUS | Status: AC
  Administered 2014-01-20 – 2014-01-21 (×2): 2 g via INTRAVENOUS
  Filled 2014-01-20 (×2): qty 50

## 2014-01-20 MED ORDER — DEXTROSE 5 % IV SOLN
3.0000 g | INTRAVENOUS | Status: AC
  Administered 2014-01-20: 3 g via INTRAVENOUS
  Filled 2014-01-20 (×2): qty 3000

## 2014-01-20 MED ORDER — PROPOFOL 10 MG/ML IV BOLUS
INTRAVENOUS | Status: DC | PRN
  Administered 2014-01-20: 150 mg via INTRAVENOUS

## 2014-01-20 MED ORDER — LINAGLIPTIN 5 MG PO TABS
5.0000 mg | ORAL_TABLET | Freq: Every day | ORAL | Status: DC
  Administered 2014-01-20 – 2014-01-25 (×6): 5 mg via ORAL
  Filled 2014-01-20 (×6): qty 1

## 2014-01-20 MED ORDER — CLONIDINE HCL 0.2 MG PO TABS
0.2000 mg | ORAL_TABLET | Freq: Every morning | ORAL | Status: DC
  Administered 2014-01-21 – 2014-01-25 (×5): 0.2 mg via ORAL
  Filled 2014-01-20 (×5): qty 1

## 2014-01-20 MED ORDER — BUPIVACAINE HCL (PF) 0.25 % IJ SOLN
INTRAMUSCULAR | Status: AC
  Filled 2014-01-20: qty 30

## 2014-01-20 MED ORDER — FUROSEMIDE 20 MG PO TABS
20.0000 mg | ORAL_TABLET | Freq: Every day | ORAL | Status: DC
  Administered 2014-01-21 – 2014-01-22 (×2): 20 mg via ORAL
  Filled 2014-01-20 (×2): qty 1

## 2014-01-20 MED ORDER — AMLODIPINE BESYLATE 10 MG PO TABS
10.0000 mg | ORAL_TABLET | Freq: Every morning | ORAL | Status: DC
  Administered 2014-01-21 – 2014-01-25 (×5): 10 mg via ORAL
  Filled 2014-01-20 (×6): qty 1

## 2014-01-20 MED ORDER — DSS 100 MG PO CAPS
100.0000 mg | ORAL_CAPSULE | Freq: Two times a day (BID) | ORAL | Status: DC
Start: 2014-01-20 — End: 2014-02-11

## 2014-01-20 MED ORDER — SUCCINYLCHOLINE CHLORIDE 20 MG/ML IJ SOLN
INTRAMUSCULAR | Status: DC | PRN
  Administered 2014-01-20: 160 mg via INTRAVENOUS

## 2014-01-20 MED ORDER — METHOCARBAMOL 1000 MG/10ML IJ SOLN
500.0000 mg | Freq: Four times a day (QID) | INTRAVENOUS | Status: DC | PRN
  Filled 2014-01-20: qty 5

## 2014-01-20 MED ORDER — METOCLOPRAMIDE HCL 10 MG PO TABS
5.0000 mg | ORAL_TABLET | Freq: Three times a day (TID) | ORAL | Status: DC | PRN
Start: 2014-01-20 — End: 2014-01-25

## 2014-01-20 MED ORDER — MIDAZOLAM HCL 2 MG/2ML IJ SOLN
INTRAMUSCULAR | Status: AC
  Filled 2014-01-20: qty 2

## 2014-01-20 MED ORDER — INSULIN GLARGINE 100 UNIT/ML ~~LOC~~ SOLN
30.0000 [IU] | Freq: Every evening | SUBCUTANEOUS | Status: DC
  Administered 2014-01-20 – 2014-01-24 (×5): 30 [IU] via SUBCUTANEOUS
  Filled 2014-01-20 (×6): qty 0.3

## 2014-01-20 MED ORDER — SODIUM CHLORIDE 0.9 % IJ SOLN
INTRAMUSCULAR | Status: AC
  Filled 2014-01-20: qty 50

## 2014-01-20 MED ORDER — ACETAMINOPHEN 10 MG/ML IV SOLN
1000.0000 mg | Freq: Once | INTRAVENOUS | Status: AC
  Administered 2014-01-20: 1000 mg via INTRAVENOUS
  Filled 2014-01-20: qty 100

## 2014-01-20 MED ORDER — GLIMEPIRIDE 4 MG PO TABS
4.0000 mg | ORAL_TABLET | Freq: Two times a day (BID) | ORAL | Status: DC
  Administered 2014-01-21 – 2014-01-23 (×6): 4 mg via ORAL
  Filled 2014-01-20 (×9): qty 1

## 2014-01-20 MED ORDER — FENTANYL CITRATE 0.05 MG/ML IJ SOLN
INTRAMUSCULAR | Status: DC | PRN
  Administered 2014-01-20 (×2): 50 ug via INTRAVENOUS
  Administered 2014-01-20: 100 ug via INTRAVENOUS
  Administered 2014-01-20: 50 ug via INTRAVENOUS

## 2014-01-20 MED ORDER — NEOSTIGMINE METHYLSULFATE 10 MG/10ML IV SOLN
INTRAVENOUS | Status: DC | PRN
  Administered 2014-01-20: 5 mg via INTRAVENOUS

## 2014-01-20 MED ORDER — TRAMADOL HCL 50 MG PO TABS
50.0000 mg | ORAL_TABLET | Freq: Four times a day (QID) | ORAL | Status: DC | PRN
  Administered 2014-01-25: 100 mg via ORAL
  Filled 2014-01-20: qty 2

## 2014-01-20 MED ORDER — 0.9 % SODIUM CHLORIDE (POUR BTL) OPTIME
TOPICAL | Status: DC | PRN
  Administered 2014-01-20: 1000 mL

## 2014-01-20 MED ORDER — ONDANSETRON HCL 4 MG/2ML IJ SOLN
INTRAMUSCULAR | Status: DC | PRN
  Administered 2014-01-20: 4 mg via INTRAVENOUS

## 2014-01-20 MED ORDER — CARVEDILOL 25 MG PO TABS
25.0000 mg | ORAL_TABLET | Freq: Two times a day (BID) | ORAL | Status: DC
  Administered 2014-01-20 – 2014-01-25 (×10): 25 mg via ORAL
  Filled 2014-01-20 (×12): qty 1

## 2014-01-20 MED ORDER — DIPHENHYDRAMINE HCL 12.5 MG/5ML PO ELIX: 12.5000 mg | ORAL_SOLUTION | ORAL | Status: DC | PRN

## 2014-01-20 MED ORDER — DEXAMETHASONE SODIUM PHOSPHATE 10 MG/ML IJ SOLN
10.0000 mg | Freq: Once | INTRAMUSCULAR | Status: AC
  Administered 2014-01-20: 10 mg via INTRAVENOUS

## 2014-01-20 MED ORDER — ACETAMINOPHEN 650 MG RE SUPP: 650.0000 mg | Freq: Four times a day (QID) | RECTAL | Status: DC | PRN

## 2014-01-20 MED ORDER — POLYETHYLENE GLYCOL 3350 17 G PO PACK
17.0000 g | PACK | Freq: Every day | ORAL | Status: DC | PRN
  Administered 2014-01-22 – 2014-01-24 (×3): 17 g via ORAL

## 2014-01-20 MED ORDER — GLYCOPYRROLATE 0.2 MG/ML IJ SOLN
INTRAMUSCULAR | Status: DC | PRN
  Administered 2014-01-20: .8 mg via INTRAVENOUS

## 2014-01-20 MED ORDER — LOSARTAN POTASSIUM 50 MG PO TABS
100.0000 mg | ORAL_TABLET | Freq: Every day | ORAL | Status: DC
  Administered 2014-01-21 – 2014-01-22 (×2): 100 mg via ORAL
  Filled 2014-01-20 (×3): qty 2

## 2014-01-20 MED ORDER — KETOROLAC TROMETHAMINE 15 MG/ML IJ SOLN
7.5000 mg | Freq: Four times a day (QID) | INTRAMUSCULAR | Status: AC | PRN
  Administered 2014-01-20: 7.5 mg via INTRAVENOUS
  Filled 2014-01-20: qty 1

## 2014-01-20 MED ORDER — LACTATED RINGERS IV SOLN
INTRAVENOUS | Status: DC | PRN
  Administered 2014-01-20: 13:00:00 via INTRAVENOUS

## 2014-01-20 MED ORDER — OXYCODONE HCL 5 MG PO TABS: 5.0000 mg | ORAL_TABLET | ORAL | Status: DC | PRN

## 2014-01-20 MED ORDER — ONDANSETRON HCL 4 MG/2ML IJ SOLN: 4.0000 mg | Freq: Four times a day (QID) | INTRAMUSCULAR | Status: DC | PRN

## 2014-01-20 MED ORDER — SODIUM CHLORIDE 0.45 % IV SOLN
INTRAVENOUS | Status: DC
  Administered 2014-01-20: 17:00:00 via INTRAVENOUS

## 2014-01-20 MED ORDER — BUPIVACAINE HCL 0.25 % IJ SOLN
INTRAMUSCULAR | Status: DC | PRN
  Administered 2014-01-20: 20 mL

## 2014-01-20 MED ORDER — FLEET ENEMA 7-19 GM/118ML RE ENEM: 1.0000 | ENEMA | Freq: Once | RECTAL | Status: AC | PRN

## 2014-01-20 SURGICAL SUPPLY — 65 items
BAG SPEC THK2 15X12 ZIP CLS (MISCELLANEOUS)
BAG ZIPLOCK 12X15 (MISCELLANEOUS) ×1 IMPLANT
BANDAGE ELASTIC 4 VELCRO ST LF (GAUZE/BANDAGES/DRESSINGS) ×1 IMPLANT
BANDAGE ELASTIC 6 VELCRO ST LF (GAUZE/BANDAGES/DRESSINGS) ×2 IMPLANT
BANDAGE ESMARK 6X9 LF (GAUZE/BANDAGES/DRESSINGS) ×1 IMPLANT
BLADE SAG 18X100X1.27 (BLADE) ×2 IMPLANT
BLADE SAW SGTL 11.0X1.19X90.0M (BLADE) ×2 IMPLANT
BNDG CMPR 9X6 STRL LF SNTH (GAUZE/BANDAGES/DRESSINGS) ×1
BNDG ESMARK 6X9 LF (GAUZE/BANDAGES/DRESSINGS) ×2
BOWL SMART MIX CTS (DISPOSABLE) ×2 IMPLANT
CAPT RP KNEE ×1 IMPLANT
CEMENT HV SMART SET (Cement) ×4 IMPLANT
CUFF TOURN SGL QUICK 34 (TOURNIQUET CUFF) ×2
CUFF TRNQT CYL 34X4X40X1 (TOURNIQUET CUFF) ×1 IMPLANT
DECANTER SPIKE VIAL GLASS SM (MISCELLANEOUS) ×2 IMPLANT
DRAPE EXTREMITY T 121X128X90 (DRAPE) ×2 IMPLANT
DRAPE POUCH INSTRU U-SHP 10X18 (DRAPES) ×2 IMPLANT
DRAPE U-SHAPE 47X51 STRL (DRAPES) ×2 IMPLANT
DRSG ADAPTIC 3X8 NADH LF (GAUZE/BANDAGES/DRESSINGS) ×2 IMPLANT
DURAPREP 26ML APPLICATOR (WOUND CARE) ×2 IMPLANT
ELECT REM PT RETURN 9FT ADLT (ELECTROSURGICAL) ×2
ELECTRODE REM PT RTRN 9FT ADLT (ELECTROSURGICAL) ×1 IMPLANT
EVACUATOR 1/8 PVC DRAIN (DRAIN) ×2 IMPLANT
FACESHIELD WRAPAROUND (MASK) ×10 IMPLANT
FACESHIELD WRAPAROUND OR TEAM (MASK) ×5 IMPLANT
GAUZE SPONGE 4X4 12PLY STRL (GAUZE/BANDAGES/DRESSINGS) ×2 IMPLANT
GLOVE BIO SURGEON STRL SZ8 (GLOVE) ×3 IMPLANT
GLOVE BIOGEL PI IND STRL 6.5 (GLOVE) IMPLANT
GLOVE BIOGEL PI IND STRL 7.5 (GLOVE) IMPLANT
GLOVE BIOGEL PI IND STRL 8 (GLOVE) ×1 IMPLANT
GLOVE BIOGEL PI IND STRL 8.5 (GLOVE) IMPLANT
GLOVE BIOGEL PI INDICATOR 6.5 (GLOVE) ×2
GLOVE BIOGEL PI INDICATOR 7.5 (GLOVE) ×1
GLOVE BIOGEL PI INDICATOR 8 (GLOVE) ×1
GLOVE BIOGEL PI INDICATOR 8.5 (GLOVE) ×1
GLOVE SURG SS PI 7.5 STRL IVOR (GLOVE) ×1 IMPLANT
GOWN SPEC L3 XXLG W/TWL (GOWN DISPOSABLE) ×1 IMPLANT
GOWN STRL REUS W/TWL LRG LVL3 (GOWN DISPOSABLE) ×3 IMPLANT
GOWN STRL REUS W/TWL XL LVL3 (GOWN DISPOSABLE) ×1 IMPLANT
HANDPIECE INTERPULSE COAX TIP (DISPOSABLE) ×2
IMMOBILIZER KNEE 20 (SOFTGOODS) ×2
IMMOBILIZER KNEE 20 THIGH 36 (SOFTGOODS) ×1 IMPLANT
KIT BASIN OR (CUSTOM PROCEDURE TRAY) ×2 IMPLANT
MANIFOLD NEPTUNE II (INSTRUMENTS) ×2 IMPLANT
NDL SAFETY ECLIPSE 18X1.5 (NEEDLE) ×2 IMPLANT
NEEDLE HYPO 18GX1.5 SHARP (NEEDLE) ×4
NS IRRIG 1000ML POUR BTL (IV SOLUTION) ×2 IMPLANT
PACK TOTAL JOINT (CUSTOM PROCEDURE TRAY) ×2 IMPLANT
PAD ABD 8X10 STRL (GAUZE/BANDAGES/DRESSINGS) ×1 IMPLANT
PADDING CAST COTTON 6X4 STRL (CAST SUPPLIES) ×3 IMPLANT
POSITIONER SURGICAL ARM (MISCELLANEOUS) ×2 IMPLANT
SET HNDPC FAN SPRY TIP SCT (DISPOSABLE) ×1 IMPLANT
STRIP CLOSURE SKIN 1/2X4 (GAUZE/BANDAGES/DRESSINGS) ×3 IMPLANT
SUCTION FRAZIER 12FR DISP (SUCTIONS) ×2 IMPLANT
SUT MNCRL AB 4-0 PS2 18 (SUTURE) ×2 IMPLANT
SUT VIC AB 2-0 CT1 27 (SUTURE) ×6
SUT VIC AB 2-0 CT1 TAPERPNT 27 (SUTURE) ×3 IMPLANT
SUT VLOC 180 0 24IN GS25 (SUTURE) ×2 IMPLANT
SYRINGE 20CC LL (MISCELLANEOUS) ×2 IMPLANT
SYRINGE 60CC LL (MISCELLANEOUS) ×2 IMPLANT
TOWEL OR 17X26 10 PK STRL BLUE (TOWEL DISPOSABLE) ×2 IMPLANT
TOWEL OR NON WOVEN STRL DISP B (DISPOSABLE) ×1 IMPLANT
TRAY FOLEY CATH 14FRSI W/METER (CATHETERS) ×2 IMPLANT
WATER STERILE IRR 1500ML POUR (IV SOLUTION) ×2 IMPLANT
WRAP KNEE MAXI GEL POST OP (GAUZE/BANDAGES/DRESSINGS) ×2 IMPLANT

## 2014-01-20 NOTE — Anesthesia Preprocedure Evaluation (Addendum)
Anesthesia Evaluation  Patient identified by MRN, date of birth, ID band Patient awake    Reviewed: Allergy & Precautions, H&P , NPO status , Patient's Chart, lab work & pertinent test results  Airway Mallampati: II TM Distance: >3 FB Neck ROM: Full    Dental no notable dental hx.    Pulmonary neg pulmonary ROS, former smoker,  breath sounds clear to auscultation  Pulmonary exam normal       Cardiovascular hypertension, Pt. on medications Rhythm:Regular Rate:Normal     Neuro/Psych negative neurological ROS  negative psych ROS   GI/Hepatic negative GI ROS, Neg liver ROS,   Endo/Other  diabetes, Insulin DependentMorbid obesity  Renal/GU negative Renal ROS  negative genitourinary   Musculoskeletal negative musculoskeletal ROS (+)   Abdominal   Peds negative pediatric ROS (+)  Hematology negative hematology ROS (+)   Anesthesia Other Findings   Reproductive/Obstetrics negative OB ROS                         Anesthesia Physical Anesthesia Plan  ASA: III  Anesthesia Plan: General   Post-op Pain Management:    Induction: Intravenous  Airway Management Planned: Oral ETT  Additional Equipment:   Intra-op Plan:   Post-operative Plan:   Informed Consent: I have reviewed the patients History and Physical, chart, labs and discussed the procedure including the risks, benefits and alternatives for the proposed anesthesia with the patient or authorized representative who has indicated his/her understanding and acceptance.   Dental advisory given  Plan Discussed with: CRNA and Surgeon  Anesthesia Plan Comments:        Anesthesia Quick Evaluation

## 2014-01-20 NOTE — Transfer of Care (Signed)
Immediate Anesthesia Transfer of Care Note  Patient: Katherine [name]  Procedure(s) Performed: Procedure(s): LEFT TOTAL KNEE ARTHROPLASTY (Left)  Patient Location: PACU  Anesthesia Type:General  Level of Consciousness: awake, alert , oriented and patient cooperative  Airway & Oxygen Therapy: Patient Spontanous Breathing and Patient connected to face mask oxygen  Post-op Assessment: Report given to PACU RN, Post -op Vital signs reviewed and stable and Patient moving all extremities  Post vital signs: Reviewed and stable  Complications: No apparent anesthesia complications

## 2014-01-20 NOTE — Anesthesia Postprocedure Evaluation (Signed)
  Anesthesia Post-op Note  Patient: Katherine [name]  Procedure(s) Performed: Procedure(s) (LRB): LEFT TOTAL KNEE ARTHROPLASTY (Left)  Patient Location: PACU  Anesthesia Type: General  Level of Consciousness: awake and alert   Airway and Oxygen Therapy: Patient Spontanous Breathing  Post-op Pain: mild  Post-op Assessment: Post-op Vital signs reviewed, Patient's Cardiovascular Status Stable, Respiratory Function Stable, Patent Airway and No signs of Nausea or vomiting  Last Vitals:  Filed Vitals:   01/20/14 1545  BP: 137/58  Pulse: 59  Temp:   Resp: 14    Post-op Vital Signs: stable   Complications: No apparent anesthesia complications

## 2014-01-20 NOTE — Interval H&P Note (Signed)
History and Physical Interval Note:  01/20/2014 12:50 PM  Katherine [name]  has presented today for surgery, with the diagnosis of OSTEOARTHRITIS LEFT KNEE  The various methods of treatment have been discussed with the patient and family. After consideration of risks, benefits and other options for treatment, the patient has consented to  Procedure(s): LEFT TOTAL KNEE ARTHROPLASTY (Left) as a surgical intervention .  The patient's history has been reviewed, patient examined, no change in status, stable for surgery.  I have reviewed the patient's chart and labs.  Questions were answered to the patient's satisfaction.     Loanne DrillingALUISIO,Sherrel Shafer V

## 2014-01-20 NOTE — Progress Notes (Signed)
No TEDS fit appropriately.

## 2014-01-20 NOTE — Op Note (Signed)
Pre-operative diagnosis- Osteoarthritis  Left knee(s)  Post-operative diagnosis- Osteoarthritis Left knee(s)  Procedure-  Left  Total Knee Arthroplasty  Surgeon- Gus RankinFrank V. Zaydn Gutridge, MD  Assistant- Dimitri PedAmber Constable, PA-C   Anesthesia-  General  EBL-* No blood loss amount entered *   Drains Hemovac  Tourniquet time-  Total Tourniquet Time Documented: Thigh (Left) - 41 minutes Total: Thigh (Left) - 41 minutes     Complications- None  Condition-PACU - hemodynamically stable.   Brief Clinical Note  Katherine [name] is a 80 y.o. year old female with end stage OA of her left knee with progressively worsening pain and dysfunction. She has constant pain, with activity and at rest and significant functional deficits with difficulties even with ADLs. She has had extensive non-op management including analgesics, injections of cortisone, and home exercise program, but remains in significant pain with significant dysfunction. Radiographs show bone on bone arthritis. She presents now for left Total Knee Arthroplasty.    Procedure in detail---   The patient is brought into the operating room and positioned supine on the operating table. After successful administration of  General,   a tourniquet is placed high on the  Left thigh(s) and the lower extremity is prepped and draped in the usual sterile fashion. Time out is performed by the operating team and then the  Left lower extremity is wrapped in Esmarch, knee flexed and the tourniquet inflated to 300 mmHg.       A midline incision is made with a ten blade through the subcutaneous tissue to the level of the extensor mechanism. A fresh blade is used to make a medial parapatellar arthrotomy. Soft tissue over the proximal medial tibia is subperiosteally elevated to the joint line with a knife and into the semimembranosus bursa with a Cobb elevator. Soft tissue over the proximal lateral tibia is elevated with attention being paid to avoiding the patellar  tendon on the tibial tubercle. The patella is everted, knee flexed 90 degrees and the ACL and PCL are removed. Findings are bone on bone all 3 compartments with massive global osteophytes.        The drill is used to create a starting hole in the distal femur and the canal is thoroughly irrigated with sterile saline to remove the fatty contents. The 5 degree Left  valgus alignment guide is placed into the femoral canal and the distal femoral cutting block is pinned to remove 10 mm off the distal femur. Resection is made with an oscillating saw.      The tibia is subluxed forward and the menisci are removed. The extramedullary alignment guide is placed referencing proximally at the medial aspect of the tibial tubercle and distally along the second metatarsal axis and tibial crest. The block is pinned to remove 2mm off the more deficient lateral  side. Resection is made with an oscillating saw. Size 3is the most appropriate size for the tibia and the proximal tibia is prepared with the modular drill and keel punch for that size.      The femoral sizing guide is placed and size 3 is most appropriate. Rotation is marked off the epicondylar axis and confirmed by creating a rectangular flexion gap at 90 degrees. The size 3 cutting block is pinned in this rotation and the anterior, posterior and chamfer cuts are made with the oscillating saw. The intercondylar block is then placed and that cut is made.      Trial size 3 tibial component, trial size 3 posterior stabilized  femur and a 12.5  mm posterior stabilized rotating platform insert trial is placed. Full extension is achieved with excellent varus/valgus and anterior/posterior balance throughout full range of motion. The patella is everted and thickness measured to be 22  mm. Free hand resection is taken to 12 mm, a 35 template is placed, lug holes are drilled, trial patella is placed, and it tracks normally. Osteophytes are removed off the posterior femur with the  trial in place. All trials are removed and the cut bone surfaces prepared with pulsatile lavage. Cement is mixed and once ready for implantation, the size 3 tibial implant, size  3 posterior stabilized femoral component, and the size 35 patella are cemented in place and the patella is held with the clamp. The trial insert is placed and the knee held in full extension. The Exparel (20 ml mixed with 30 ml saline) and .25% Bupivicaine, are injected into the extensor mechanism, posterior capsule, medial and lateral gutters and subcutaneous tissues.  All extruded cement is removed and once the cement is hard the permanent 12.5 mm posterior stabilized rotating platform insert is placed into the tibial tray.      The wound is copiously irrigated with saline solution and the extensor mechanism closed over a hemovac drain with #1 V-loc suture. The tourniquet is released for a total tourniquet time of 41  minutes. Flexion against gravity is 125 degrees and the patella tracks normally. Subcutaneous tissue is closed with 2.0 vicryl and subcuticular with running 4.0 Monocryl. The incision is cleaned and dried and steri-strips and a bulky sterile dressing are applied. The limb is placed into a knee immobilizer and the patient is awakened and transported to recovery in stable condition.      Please note that a surgical assistant was a medical necessity for this procedure in order to perform it in a safe and expeditious manner. Surgical assistant was necessary to retract the ligaments and vital neurovascular structures to prevent injury to them and also necessary for proper positioning of the limb to allow for anatomic placement of the prosthesis.   Gus Rankin Katherine Sovine, MD    01/20/2014, 2:26 PM

## 2014-01-21 LAB — CBC
HCT: 31.9 % — ABNORMAL LOW (ref 36.0–46.0)
Hemoglobin: 10.7 g/dL — ABNORMAL LOW (ref 12.0–15.0)
MCH: 27 pg (ref 26.0–34.0)
MCHC: 33.5 g/dL (ref 30.0–36.0)
MCV: 80.4 fL (ref 78.0–100.0)
Platelets: 187 10*3/uL (ref 150–400)
RBC: 3.97 MIL/uL (ref 3.87–5.11)
RDW: 15.1 % (ref 11.5–15.5)
WBC: 10.8 10*3/uL — ABNORMAL HIGH (ref 4.0–10.5)

## 2014-01-21 LAB — GLUCOSE, CAPILLARY
Glucose-Capillary: 160 mg/dL — ABNORMAL HIGH (ref 70–99)
Glucose-Capillary: 255 mg/dL — ABNORMAL HIGH (ref 70–99)
Glucose-Capillary: 234 mg/dL — ABNORMAL HIGH (ref 70–99)
Glucose-Capillary: 239 mg/dL — ABNORMAL HIGH (ref 70–99)

## 2014-01-21 LAB — BASIC METABOLIC PANEL
ANION GAP: 12 (ref 5–15)
BUN: 32 mg/dL — ABNORMAL HIGH (ref 6–23)
CALCIUM: 8.7 mg/dL (ref 8.4–10.5)
CO2: 22 mEq/L (ref 19–32)
Chloride: 105 mEq/L (ref 96–112)
Creatinine, Ser: 1.72 mg/dL — ABNORMAL HIGH (ref 0.50–1.10)
GFR, EST AFRICAN AMERICAN: 32 mL/min — AB (ref >90)
GFR, EST NON AFRICAN AMERICAN: 27 mL/min — AB (ref >90)
Glucose, Bld: 167 mg/dL — ABNORMAL HIGH (ref 70–99)
Potassium: 3.7 mEq/L (ref 3.7–5.3)
SODIUM: 139 meq/L (ref 137–147)

## 2014-01-21 NOTE — Progress Notes (Signed)
OT Cancellation Note  Patient Details Name: Katherine [name] MRN: [id number] DOB: [birth year]   Cancelled Treatment:    Reason Eval/Treat Not Completed: Note plan for SNF. Will defer OT to SNF.   Lennox LaityStone, [place] Kitner Stafford [phone] 01/21/2014, 11:55 AM

## 2014-01-21 NOTE — Evaluation (Signed)
Physical Therapy Evaluation Patient Details Name: Katherine [name] MRN: [id number] DOB: 07/02/1933 Today's Date: 01/21/2014   History of Present Illness  Pt is a 80 year old female s/p L TKA  Clinical Impression  Pt is s/p L TKA resulting in the deficits listed below (see PT Problem List). Pt will benefit from skilled PT to increase their independence and safety with mobility to allow discharge to the venue listed below.  Pt assisted OOB and able to perform only short distance ambulation due to fatigue and pain.  Pt plans to d/c to SNF.      Follow Up Recommendations SNF    Equipment Recommendations  None recommended by PT    Recommendations for Other Services       Precautions / Restrictions Precautions Precautions: Knee;Fall Required Braces or Orthoses: Knee Immobilizer - Left Knee Immobilizer - Left: Discontinue once straight leg raise with < 10 degree lag Restrictions Weight Bearing Restrictions: No Other Position/Activity Restrictions: WBAT      Mobility  Bed Mobility Overal bed mobility: Needs Assistance Bed Mobility: Supine to Sit     Supine to sit: Min assist;HOB elevated     General bed mobility comments: verbal cues for technique, assist for L LE  Transfers Overall transfer level: Needs assistance Equipment used: Rolling walker (2 wheeled) Transfers: Sit to/from Stand Sit to Stand: Min assist;From elevated surface;+2 safety/equipment         General transfer comment: verbal cues for UE and LE positioning  Ambulation/Gait Ambulation/Gait assistance: Min assist;+2 safety/equipment Ambulation Distance (Feet): 8 Feet Assistive device: Rolling walker (2 wheeled) Gait Pattern/deviations: Step-to pattern;Antalgic;Trunk flexed;Decreased stance time - left Gait velocity: decreased   General Gait Details: verbal cues for sequence, RW distance, step length, WBing through RW to assist with pain control  Stairs            Wheelchair Mobility     Modified Rankin (Stroke Patients Only)       Balance                                             Pertinent Vitals/Pain Pain Assessment: 0-10 Pain Score: 5  Pain Location: L knee Pain Descriptors / Indicators: Aching;Sore Pain Intervention(s): Limited activity within patient's tolerance;Premedicated before session;Repositioned;Ice applied    Home Living Family/patient expects to be discharged to:: Skilled nursing facility Living Arrangements: Alone                    Prior Function Level of Independence: Independent with assistive device(s)         Comments: uses SPC     Hand Dominance        Extremity/Trunk Assessment               Lower Extremity Assessment: LLE deficits/detail   LLE Deficits / Details: unable to perform SLR, AAROM knee flexion 45* limited by pain     Communication   Communication: No difficulties  Cognition Arousal/Alertness: Awake/alert Behavior During Therapy: WFL for tasks assessed/performed Overall Cognitive Status: Within Functional Limits for tasks assessed                      General Comments      Exercises Total Joint Exercises Ankle Circles/Pumps: AROM;Both;10 reps Quad Sets: AROM;Both;15 reps Towel Squeeze: AROM;Both;15 reps Short Arc QuadBarbaraann Boys: AAROM;Left;10 reps Heel Slides: AAROM;Left;10 reps Hip  ABduction/ADduction: AAROM;Left;10 reps Straight Leg Raises: AAROM;Left;10 reps      Assessment/Plan    PT Assessment Patient needs continued PT services  PT Diagnosis Difficulty walking;Acute pain   PT Problem List Decreased strength;Decreased range of motion;Decreased mobility;Pain;Obesity;Decreased knowledge of use of DME;Decreased knowledge of precautions  PT Treatment Interventions Functional mobility training;Gait training;DME instruction;Therapeutic activities;Patient/family education;Therapeutic exercise   PT Goals (Current goals can be found in the Care Plan section) Acute  Rehab PT Goals PT Goal Formulation: With patient Time For Goal Achievement: 01/26/14 Potential to Achieve Goals: Good    Frequency 7X/week   Barriers to discharge        Co-evaluation               End of Session Equipment Utilized During Treatment: Left knee immobilizer Activity Tolerance: Patient limited by fatigue;Patient limited by pain Patient left: with call bell/phone within reach;in chair           Time: 0940-1003 PT Time Calculation (min): 23 min   Charges:   PT Evaluation $Initial PT Evaluation Tier I: 1 Procedure PT Treatments $Gait Training: 8-22 mins $Therapeutic Exercise: 8-22 mins   PT G Codes:          Felita Bump,KATHrine E 01/21/2014, 1:41 PM Zenovia Jarred, PT, DPT 01/21/2014 Pager: [phone]

## 2014-01-21 NOTE — Progress Notes (Signed)
Physical Therapy Treatment Note   01/21/14 1500  PT Visit Information  Last PT Received On 01/21/14  Assistance Needed +2  History of Present Illness Pt is a [age] year old female s/p L TKA  PT Time Calculation  PT Start Time 1430  PT Stop Time 1442  PT Time Calculation (min) 12 min  Subjective Data  Subjective Pt ambulated short distance again this afternoon then assisted back to bed.  Precautions  Precautions Knee;Fall  Required Braces or Orthoses Knee Immobilizer - Left  Knee Immobilizer - Left Discontinue once straight leg raise with < 10 degree lag  Restrictions  Other Position/Activity Restrictions WBAT  Pain Assessment  Pain Assessment 0-10  Pain Score 5  Pain Location L knee  Pain Descriptors / Indicators Aching;Sore  Pain Intervention(s) Limited activity within patient's tolerance;Repositioned;Ice applied  Cognition  Arousal/Alertness Awake/alert  Behavior During Therapy WFL for tasks assessed/performed  Overall Cognitive Status Within Functional Limits for tasks assessed  Bed Mobility  Overal bed mobility Needs Assistance  Bed Mobility Sit to Supine  Supine to sit Mod assist  General bed mobility comments assist for LEs onto bed  Transfers  Overall transfer level Needs assistance  Equipment used Rolling walker (2 wheeled)  Transfers Sit to/from BJ'sStand;Stand Pivot Transfers  Sit to Stand Mod assist;+2 physical assistance  Stand pivot transfers Min guard;+2 safety/equipment  General transfer comment verbal cues for technique, rocking technique used to stand from lower recliner surface  Ambulation/Gait  Ambulation/Gait assistance Min assist;+2 safety/equipment  Ambulation Distance (Feet) 8 Feet  Assistive device Rolling walker (2 wheeled)  Gait Pattern/deviations Step-to pattern;Antalgic;Trunk flexed;Decreased stance time - left  Gait velocity decreased  General Gait Details verbal cues for sequence, RW distance, step length, WBing through RW to assist with pain  control  PT - End of Session  Equipment Utilized During Treatment Left knee immobilizer  Activity Tolerance Patient limited by pain  Patient left in bed;with call bell/phone within reach  PT - Assessment/Plan  PT Plan Current plan remains appropriate  PT Frequency 7X/week  Follow Up Recommendations SNF  PT equipment None recommended by PT  PT Goal Progression  Progress towards PT goals Progressing toward goals  PT General Charges  $$ ACUTE PT VISIT 1 Procedure  PT Treatments  $Gait Training 8-22 mins   Zenovia JarredKati Austina Constantin, PT, DPT 01/21/2014 Pager: [phone]

## 2014-01-21 NOTE — Care Management Note (Signed)
    Page 1 of 1   01/21/2014     2:14:55 PM CARE MANAGEMENT NOTE 01/21/2014  Patient:  Myrtice LauthWESTON,Lizette P   Account Number:  [id number]  Date Initiated:  01/21/2014  Documentation initiated by:  Lanier ClamMAHABIR,Jyron Turman  Subjective/Objective Assessment:   80 Y/O F ADMITTED W/OA L KNEE.     Action/Plan:   FROM HOME.   Anticipated DC Date:  01/24/2014   Anticipated DC Plan:  SKILLED NURSING FACILITY      DC Planning Services  CM consult      Choice offered to / List presented to:             Status of service:  In process, will continue to follow Medicare Important Message given?   (If response is "NO", the following Medicare IM given date fields will be blank) Date Medicare IM given:   Medicare IM given by:   Date Additional Medicare IM given:   Additional Medicare IM given by:    Discharge Disposition:    Per UR Regulation:  Reviewed for med. necessity/level of care/duration of stay  If discussed at Long Length of Stay Meetings, dates discussed:    Comments:  01/21/14 Tanasha Menees RN,BSN NCM [phone] PT-SNF.CSW FOLLOWING FOR SNF.[facility] WILL FOLLOW @ SNF FOR HH IF NEEDED.

## 2014-01-21 NOTE — Progress Notes (Signed)
Clinical Social Work Department CLINICAL SOCIAL WORK PLACEMENT NOTE 01/21/2014  Patient:  Katherine [name],Katherine [name]  Account Number:  [id number] Admit date:  01/20/2014  Clinical Social Worker:  Cori RazorJAMIE Ashton Sabine, LCSW  Date/time:  01/21/2014 01:20 PM  Clinical Social Work is seeking post-discharge placement for this patient at the following level of care:   SKILLED NURSING   (*CSW will update this form in Epic as items are completed)     Patient/family provided with [facility] of Clinical Social Work's list of facilities offering this level of care within the geographic area requested by the patient (or if unable, by the patient's family).  01/21/2014  Patient/family informed of their freedom to choose among providers that offer the needed level of care, that participate in Medicare, Medicaid or managed care program needed by the patient, have an available bed and are willing to accept the patient.    Patient/family informed of [facility]' ownership interest in [facility], as well as of the fact that they are under no obligation to receive care at this facility.  PASARR submitted to EDS on 01/21/2014 PASARR number received on 01/21/2014  FL2 transmitted to all facilities in geographic area requested by pt/family on  01/21/2014 FL2 transmitted to all facilities within larger geographic area on   Patient informed that his/her managed care company has contracts with or will negotiate with  certain facilities, including the following:     Patient/family informed of bed offers received:  01/21/2014 [facility] Phys[facility] recommends and patient chooses bed at    [facility]   Patient to be transferred to facility by  Patient and family notified of transfer on  Name of family member notified:    The following physician request were entered in Epic:   Additional Comments:  Cori RazorJamie Saphyra Hutt LCSW [phone]
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
## 2014-01-21 NOTE — Progress Notes (Signed)
Clinical Social Work Department BRIEF PSYCHOSOCIAL ASSESSMENT 01/21/2014  Patient:  Katherine [name], Katherine [name]     Account Number:  [id number]     Admit date:  01/20/2014  Clinical Social Worker:  Robbi Garter  Date/Time:  01/21/2014 01:08 PM  Referred by:  Physician  Date Referred:  01/21/2014 Referred for  SNF Placement   Other Referral:   Interview type:   Other interview type:    PSYCHOSOCIAL DATA Living Status:  ALONE Admitted from facility:   Level of care:   Primary support name:  Doreen Lumpkin Primary support relationship to patient:  CHILD, ADULT Degree of support available:   unclear    CURRENT CONCERNS Current Concerns  Post-Acute Placement   Other Concerns:    SOCIAL WORK ASSESSMENT / PLAN Pt is a 80 yr old female living at home prior to hospitalization. CSW met with pt to assist with d/c planning. This is a planned admission. Pt has made prior arrangements to have ST Rehab at [facility] following hospital d/c. CSW has contacted SNF and d/c plans have been confirmed. CSW will continue to follow to assist with d/c planning to SNF.   Assessment/plan status:  Psychosocial Support/Ongoing Assessment of Needs Other assessment/ plan:   Information/referral to community resources:   Insurance Coverage for SNF and ambulance transport reviewed.    PATIENT'S/FAMILY'S RESPONSE TO PLAN OF CARE: Pt's mood is bright. She had a good night following surgery. Pt is looking forward to having rehab at [facility]. She is motivated to begin therapy.   Werner Lean LCSW [phone]

## 2014-01-21 NOTE — Discharge Instructions (Addendum)
° °Dr. Frank Aluisio °Total Joint Specialist °[facility] °[address] °[place], [place] [address] °[phone] ° °TOTAL KNEE REPLACEMENT POSTOPERATIVE DIRECTIONS ° ° ° °Knee Rehabilitation, Guidelines Following Surgery  °Results after knee surgery are often greatly improved when you follow the exercise, range of motion and muscle strengthening exercises prescribed by your doctor. Safety measures are also important to protect the knee from further injury. Any time any of these exercises cause you to have increased pain or swelling in your knee joint, decrease the amount until you are comfortable again and slowly increase them. If you have problems or questions, call your caregiver or physical therapist for advice.  ° °HOME CARE INSTRUCTIONS  °Remove items at home which could result in a fall. This includes throw rugs or furniture in walking pathways.  °Continue medications as instructed at time of discharge. °You may have some home medications which will be placed on hold until you complete the course of blood thinner medication.  °You may start showering once you are discharged home but do not submerge the incision under water. Just pat the incision dry and apply a dry gauze dressing on daily. °Walk with walker as instructed.  °You may resume a sexual relationship in one month or when given the OK by  your doctor.  °· Use walker as long as suggested by your caregivers. °· Avoid periods of inactivity such as sitting longer than an hour when not asleep. This helps prevent blood clots.  °You may put full weight on your legs and walk as much as is comfortable.  °You may return to work once you are cleared by your doctor.  °Do not drive a car for 6 weeks or until released by you surgeon.  °· Do not drive while taking narcotics.  °Wear the elastic stockings for three weeks following surgery during the day but you may remove then at night. °Make sure you keep all of your appointments after your  operation with all of your doctors and caregivers. You should call the office at the above phone number and make an appointment for approximately two weeks after the date of your surgery. °Change the dressing daily and reapply a dry dressing each time. °Please pick up a stool softener and laxative for home use as long as you are requiring pain medications. °· Continue to use ice on the knee for pain and swelling from surgery. You may notice swelling that will progress down to the foot and ankle.  This is normal after surgery.  Elevate the leg when you are not up walking on it.   °It is important for you to complete the blood thinner medication as prescribed by your doctor. °· Continue to use the breathing machine which will help keep your temperature down.  It is common for your temperature to cycle up and down following surgery, especially at night when you are not up moving around and exerting yourself.  The breathing machine keeps your lungs expanded and your temperature down. ° °RANGE OF MOTION AND STRENGTHENING EXERCISES  °Rehabilitation of the knee is important following a knee injury or an operation. After just a few days of immobilization, the muscles of the thigh which control the knee become weakened and shrink (atrophy). Knee exercises are designed to build up the tone and strength of the thigh muscles and to improve knee motion. Often times heat used for twenty to thirty minutes before working out will loosen up your tissues and help with improving the   range of motion but do not use heat for the first two weeks following surgery. These exercises can be done on a training (exercise) mat, on the floor, on a table or on a bed. Use what ever works the best and is most comfortable for you Knee exercises include:  Leg Lifts - While your knee is still immobilized in a splint or cast, you can do straight leg raises. Lift the leg to 60 degrees, hold for 3 sec, and slowly lower the leg. Repeat 10-20 times 2-3  times daily. Perform this exercise against resistance later as your knee gets better.  Quad and Hamstring Sets - Tighten up the muscle on the front of the thigh (Quad) and hold for 5-10 sec. Repeat this 10-20 times hourly. Hamstring sets are done by pushing the foot backward against an object and holding for 5-10 sec. Repeat as with quad sets.  A rehabilitation program following serious knee injuries can speed recovery and prevent re-injury in the future due to weakened muscles. Contact your doctor or a physical therapist for more information on knee rehabilitation.   SKILLED REHAB INSTRUCTIONS: If the patient is transferred to a skilled rehab facility following release from the hospital, a list of the current medications will be sent to the facility for the patient to continue.  When discharged from the skilled rehab facility, please have the facility set up the patient's Home Health Physical Therapy prior to being released. Also, the skilled facility will be responsible for providing the patient with their medications at time of release from the facility to include their pain medication, the muscle relaxants, and their blood thinner medication. If the patient is still at the rehab facility at time of the two week follow up appointment, the skilled rehab facility will also need to assist the patient in arranging follow up appointment in our office and any transportation needs.  MAKE SURE YOU:  Understand these instructions.  Will watch your condition.  Will get help right away if you are not doing well or get worse.    Pick up stool softner and laxative for home. Do not submerge incision under water. May shower. Continue to use ice for pain and swelling from surgery.  Information on my medicine - XARELTO (Rivaroxaban)  This medication education was reviewed with me or my healthcare representative as part of my discharge preparation.  The pharmacist that spoke with me during my hospital stay  was:  Rollene Fare, RPH  Why was Xarelto prescribed for you? Xarelto was prescribed for you to reduce the risk of blood clots forming after orthopedic surgery. The medical term for these abnormal blood clots is venous thromboembolism (VTE).  What do you need to know about xarelto ? Take your Xarelto ONCE DAILY at the same time every day. You may take it either with or without food.  If you have difficulty swallowing the tablet whole, you may crush it and mix in applesauce just prior to taking your dose.  Take Xarelto exactly as prescribed by your doctor and DO NOT stop taking Xarelto without talking to the doctor who prescribed the medication.  Stopping without other VTE prevention medication to take the place of Xarelto may increase your risk of developing a clot.  After discharge, you should have regular check-up appointments with your healthcare provider that is prescribing your Xarelto.    What do you do if you miss a dose? If you miss a dose, take it as soon as you remember on  the same day then continue your regularly scheduled once daily regimen the next day. Do not take two doses of Xarelto on the same day.   Important Safety Information A possible side effect of Xarelto is bleeding. You should call your healthcare provider right away if you experience any of the following:   Bleeding from an injury or your nose that does not stop.   Unusual colored urine (red or dark brown) or unusual colored stools (red or black).   Unusual bruising for unknown reasons.   A serious fall or if you hit your head (even if there is no bleeding).  Some medicines may interact with Xarelto and might increase your risk of bleeding while on Xarelto. To help avoid this, consult your healthcare provider or pharmacist prior to using any new prescription or non-prescription medications, including herbals, vitamins, non-steroidal anti-inflammatory drugs (NSAIDs) and supplements.  This website  has more information on Xarelto: [email or web address].

## 2014-01-22 DIAGNOSIS — M1710 Unilateral primary osteoarthritis, unspecified knee: Secondary | ICD-10-CM

## 2014-01-22 LAB — BASIC METABOLIC PANEL
ANION GAP: 12 (ref 5–15)
Anion gap: 13 (ref 5–15)
BUN: 51 mg/dL — AB (ref 6–23)
BUN: 59 mg/dL — ABNORMAL HIGH (ref 6–23)
CALCIUM: 8.4 mg/dL (ref 8.4–10.5)
CHLORIDE: 102 meq/L (ref 96–112)
CHLORIDE: 101 meq/L (ref 96–112)
CO2: 23 mEq/L (ref 19–32)
CO2: 23 meq/L (ref 19–32)
CREATININE: 2.99 mg/dL — AB (ref 0.50–1.10)
Calcium: 8.7 mg/dL (ref 8.4–10.5)
Creatinine, Ser: 2.75 mg/dL — ABNORMAL HIGH (ref 0.50–1.10)
GFR calc Af Amer: 18 mL/min — ABNORMAL LOW (ref >90)
GFR calc non Af Amer: 14 mL/min — ABNORMAL LOW (ref >90)
GFR, EST AFRICAN AMERICAN: 16 mL/min — AB (ref >90)
GFR, EST NON AFRICAN AMERICAN: 15 mL/min — AB (ref >90)
Glucose, Bld: 303 mg/dL — ABNORMAL HIGH (ref 70–99)
Glucose, Bld: 195 mg/dL — ABNORMAL HIGH (ref 70–99)
Potassium: 4.1 mEq/L (ref 3.7–5.3)
Potassium: 3.9 mEq/L (ref 3.7–5.3)
SODIUM: 136 meq/L — AB (ref 137–147)
Sodium: 138 mEq/L (ref 137–147)

## 2014-01-22 LAB — GLUCOSE, CAPILLARY
GLUCOSE-CAPILLARY: 293 mg/dL — AB (ref 70–99)
GLUCOSE-CAPILLARY: 229 mg/dL — AB (ref 70–99)
GLUCOSE-CAPILLARY: 178 mg/dL — AB (ref 70–99)
GLUCOSE-CAPILLARY: 273 mg/dL — AB (ref 70–99)

## 2014-01-22 LAB — CBC
HEMATOCRIT: 31.9 % — AB (ref 36.0–46.0)
HEMOGLOBIN: 10.6 g/dL — AB (ref 12.0–15.0)
MCH: 26.8 pg (ref 26.0–34.0)
MCHC: 33.2 g/dL (ref 30.0–36.0)
MCV: 80.8 fL (ref 78.0–100.0)
Platelets: 191 10*3/uL (ref 150–400)
RBC: 3.95 MIL/uL (ref 3.87–5.11)
RDW: 15.3 % (ref 11.5–15.5)
WBC: 13 10*3/uL — ABNORMAL HIGH (ref 4.0–10.5)

## 2014-01-22 MED ORDER — SODIUM CHLORIDE 0.9 % IV SOLN
INTRAVENOUS | Status: DC
  Administered 2014-01-23: 15:00:00 via INTRAVENOUS

## 2014-01-22 NOTE — Progress Notes (Signed)
   Subjective: 2 Days Post-Op Procedure(s) (LRB): LEFT TOTAL KNEE ARTHROPLASTY (Left)  C/o mild pain Plan for [facility] on Monday Overall feels much improved with activity and pain Patient reports pain as mild.  Objective:   VITALS:   Filed Vitals:   01/22/14 0557  BP: 153/68  Pulse: 64  Temp: 98.2 F (36.8 C)  Resp: 16    Left knee incision healing well Dressing change per nursing nv intact distally No rashes or drainage  LABS  Recent Labs  01/21/14 0420 01/22/14 0456  HGB 10.7* 10.6*  HCT 31.9* 31.9*  WBC 10.8* 13.0*  PLT 187 191     Recent Labs  01/21/14 0420 01/22/14 0456  NA 139 138  K 3.7 4.1  BUN 32* 51*  CREATININE 1.72* 2.75*  GLUCOSE 167* 303*     Assessment/Plan: 2 Days Post-Op Procedure(s) (LRB): LEFT TOTAL KNEE ARTHROPLASTY (Left)  Continue PT/OT Plan for [facility] placement on Monday Pain control as needed   Alphonsa OverallBrad Saagar Tortorella, MPAS, PA-C  01/22/2014, 7:26 AM

## 2014-01-22 NOTE — Plan of Care (Signed)
Problem: Phase III Progression Outcomes Goal: Anticoagulant follow-up in place Outcome: Not Applicable Date Met:  26/37/85 xarelto

## 2014-01-22 NOTE — Progress Notes (Signed)
Pt has been scanned for almost 300 cc. She has opted for a foley. Taralyn Ferraiolo, [employer]
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
## 2014-01-22 NOTE — Progress Notes (Signed)
Physical Therapy Treatment Patient Details Name: Myrtice LauthClara P Lagos MRN: [id number] DOB: May 10, 1934 Today's Date: 01/22/2014    History of Present Illness Pt is a 80 year old female s/p L TKA    PT Comments    Pt mobilizing much better today.  Follow Up Recommendations  SNF     Equipment Recommendations  None recommended by PT    Recommendations for Other Services       Precautions / Restrictions Precautions Precautions: Knee;Fall Required Braces or Orthoses: Knee Immobilizer - Left Knee Immobilizer - Left: Discontinue once straight leg raise with < 10 degree lag    Mobility  Bed Mobility   Bed Mobility: Supine to Sit     Supine to sit: Min assist     General bed mobility comments: pt moving self better today,   Transfers   Equipment used: Rolling walker (2 wheeled) Transfers: Sit to/from Stand Sit to Stand: Min assist;From elevated surface         General transfer comment: verbal cues for technique,  Ambulation/Gait Ambulation/Gait assistance: Min assist Ambulation Distance (Feet): 20 Feet Assistive device: Rolling walker (2 wheeled) Gait Pattern/deviations: Step-through pattern;Step-to pattern;Decreased step length - left Gait velocity: decreased   General Gait Details: verbal cues for sequence, RW distance, step length,    Stairs            Wheelchair Mobility    Modified Rankin (Stroke Patients Only)       Balance                                    Cognition Arousal/Alertness: Awake/alert                          Exercises Total Joint Exercises Ankle Circles/Pumps: AROM;Both;10 reps Quad Sets: AROM;Both;15 reps Towel Squeeze: AROM;15 reps;Both Short Arc Quad: AAROM;Left;10 reps Heel Slides: AAROM;Left;10 reps Straight Leg Raises: AAROM;Left;10 reps    General Comments        Pertinent Vitals/Pain Pain Assessment: 0-10 Pain Score: 2  Pain Descriptors / Indicators: Discomfort Pain Intervention(s):  Premedicated before session    Home Living                      Prior Function            PT Goals (current goals can now be found in the care plan section) Progress towards PT goals: Progressing toward goals    Frequency  7X/week    PT Plan Current plan remains appropriate    Co-evaluation             End of Session Equipment Utilized During Treatment: Left knee immobilizer Activity Tolerance: Patient tolerated treatment well Patient left: in chair;with call bell/phone within reach;with family/visitor present     Time: [id number] PT Time Calculation (min): 23 min  Charges:  $Gait Training: 23-37 mins $Therapeutic Exercise: 8-22 mins                    G Codes:      Rada HayHill, Katilin Raynes [name] 01/22/2014, 11:33 AM

## 2014-01-22 NOTE — Progress Notes (Signed)
Physical Therapy Treatment Patient Details Name: Katherine [name] MRN: [id number] DOB: 1933/11/23 Today's Date: 01/22/2014    History of Present Illness Pt is a 80 year old female s/p L TKA    PT Comments    Now having urinary output issues. Continue mobilizing and exercises.  Follow Up Recommendations  SNF     Equipment Recommendations  None recommended by PT    Recommendations for Other Services       Precautions / Restrictions Precautions Precautions: Knee;Fall Required Braces or Orthoses: Knee Immobilizer - Left Knee Immobilizer - Left: Discontinue once straight leg raise with < 10 degree lag    Mobility  Bed Mobility                  Transfers                    Ambulation/Gait                 Stairs            Wheelchair Mobility    Modified Rankin (Stroke Patients Only)       Balance                                    Cognition Arousal/Alertness: Awake/alert                          Exercises Total Joint Exercises Ankle Circles/Pumps: AROM;Both;10 reps Quad Sets: AROM;Both;15 reps Towel Squeeze: AROM;15 reps;Both Short Arc Quad: AAROM;Left;10 reps Heel Slides: AAROM;Left;10 reps Straight Leg Raises: AAROM;Left;10 reps    General Comments        Pertinent Vitals/Pain      Home Living                      Prior Function            PT Goals (current goals can now be found in the care plan section) Progress towards PT goals: Progressing toward goals    Frequency  7X/week    PT Plan Current plan remains appropriate    Co-evaluation             End of Session   Activity Tolerance: Patient tolerated treatment well Patient left: in bed;with call bell/phone within reach (awaiting bladder scan, then get up to walk.)     Time: [id number] PT Time Calculation (min): 16 min  Charges:  $Therapeutic Exercise: 8-22 mins                    G Codes:      Rada HayHill, Amritha Yorke  [name] 01/22/2014, 10:00 AM

## 2014-01-23 ENCOUNTER — Inpatient Hospital Stay (HOSPITAL_COMMUNITY): Payer: Medicare Other

## 2014-01-23 LAB — BASIC METABOLIC PANEL
ANION GAP: 12 (ref 5–15)
Anion gap: 12 (ref 5–15)
BUN: 70 mg/dL — ABNORMAL HIGH (ref 6–23)
BUN: 68 mg/dL — AB (ref 6–23)
CHLORIDE: 104 meq/L (ref 96–112)
CO2: 23 mEq/L (ref 19–32)
CO2: 23 mEq/L (ref 19–32)
Calcium: 8.1 mg/dL — ABNORMAL LOW (ref 8.4–10.5)
Calcium: 8.4 mg/dL (ref 8.4–10.5)
Chloride: 104 mEq/L (ref 96–112)
Creatinine, Ser: 3.52 mg/dL — ABNORMAL HIGH (ref 0.50–1.10)
Creatinine, Ser: 3.33 mg/dL — ABNORMAL HIGH (ref 0.50–1.10)
GFR calc non Af Amer: 11 mL/min — ABNORMAL LOW (ref >90)
GFR calc non Af Amer: 12 mL/min — ABNORMAL LOW (ref >90)
GFR, EST AFRICAN AMERICAN: 13 mL/min — AB (ref >90)
GFR, EST AFRICAN AMERICAN: 14 mL/min — AB (ref >90)
GLUCOSE: 186 mg/dL — AB (ref 70–99)
Glucose, Bld: 268 mg/dL — ABNORMAL HIGH (ref 70–99)
Potassium: 4 mEq/L (ref 3.7–5.3)
Potassium: 3.7 mEq/L (ref 3.7–5.3)
SODIUM: 139 meq/L (ref 137–147)
Sodium: 139 mEq/L (ref 137–147)

## 2014-01-23 LAB — CBC
HCT: 28.5 % — ABNORMAL LOW (ref 36.0–46.0)
Hemoglobin: 9.6 g/dL — ABNORMAL LOW (ref 12.0–15.0)
MCH: 26.8 pg (ref 26.0–34.0)
MCHC: 33.7 g/dL (ref 30.0–36.0)
MCV: 79.6 fL (ref 78.0–100.0)
PLATELETS: 189 10*3/uL (ref 150–400)
RBC: 3.58 MIL/uL — AB (ref 3.87–5.11)
RDW: 15.2 % (ref 11.5–15.5)
WBC: 12.7 10*3/uL — AB (ref 4.0–10.5)

## 2014-01-23 LAB — GLUCOSE, CAPILLARY
GLUCOSE-CAPILLARY: 178 mg/dL — AB (ref 70–99)
GLUCOSE-CAPILLARY: 167 mg/dL — AB (ref 70–99)
Glucose-Capillary: 217 mg/dL — ABNORMAL HIGH (ref 70–99)
Glucose-Capillary: 183 mg/dL — ABNORMAL HIGH (ref 70–99)

## 2014-01-23 LAB — URINALYSIS, ROUTINE W REFLEX MICROSCOPIC
Bilirubin Urine: NEGATIVE
GLUCOSE, UA: NEGATIVE mg/dL
Ketones, ur: NEGATIVE mg/dL
Nitrite: NEGATIVE
PH: 5.5 (ref 5.0–8.0)
Protein, ur: >300 mg/dL — AB
Specific Gravity, Urine: 1.018 (ref 1.005–1.030)
Urobilinogen, UA: 0.2 mg/dL (ref 0.0–1.0)

## 2014-01-23 LAB — URINE MICROSCOPIC-ADD ON

## 2014-01-23 MED ORDER — CEFTRIAXONE SODIUM 1 G IJ SOLR
1.0000 g | INTRAMUSCULAR | Status: DC
  Administered 2014-01-23 – 2014-01-24 (×2): 1 g via INTRAVENOUS
  Filled 2014-01-23 (×3): qty 10

## 2014-01-23 NOTE — Progress Notes (Signed)
Physical Therapy Treatment Patient Details Name: Katherine [name] MRN: [id number] DOB: 01/27/1934 Today's Date: 01/23/2014    History of Present Illness Pt is a 80 year old female s/p L TKA    PT Comments    POD # 2 am session.  Applied KI and educated pt on use for amb.  Asssited OOB with increased time.  Amb limited distance due to increased c/o pain.    Follow Up Recommendations  SNF ([facility])     Equipment Recommendations       Recommendations for Other Services       Precautions / Restrictions Precautions Precautions: Knee;Fall Precaution Comments: instructed pt on KI use for amb Required Braces or Orthoses: Knee Immobilizer - Left Knee Immobilizer - Left: Discontinue once straight leg raise with < 10 degree lag Restrictions Weight Bearing Restrictions: No Other Position/Activity Restrictions: WBAT    Mobility  Bed Mobility Overal bed mobility: Needs Assistance Bed Mobility: Supine to Sit     Supine to sit: Min assist;Mod assist     General bed mobility comments: pt required extra time and asssit esp to scoot to EOB  Transfers Overall transfer level: Needs assistance Equipment used: Rolling walker (2 wheeled) Transfers: Sit to/from Stand Sit to Stand: Min assist;Mod assist         General transfer comment: verbal cues for technique, and increased time  Ambulation/Gait Ambulation/Gait assistance: Min assist Ambulation Distance (Feet): 6 Feet (decreased amb distance due to increased c/o pain) Assistive device: Rolling walker (2 wheeled) Gait Pattern/deviations: Step-to pattern;Decreased stance time - right Gait velocity: decreased   General Gait Details: verbal cues for sequence, RW distance, step length, plus increased time with difficulty weight shifting   Stairs            Wheelchair Mobility    Modified Rankin (Stroke Patients Only)       Balance                                    Cognition                             Exercises      General Comments        Pertinent Vitals/Pain      Home Living                      Prior Function            PT Goals (current goals can now be found in the care plan section) Progress towards PT goals: Progressing toward goals    Frequency  7X/week    PT Plan      Co-evaluation             End of Session Equipment Utilized During Treatment: Left knee immobilizer Activity Tolerance: Patient limited by fatigue;Patient limited by pain Patient left: in chair;with call bell/phone within reach;with family/visitor present     Time: 4098-1191 PT Time Calculation (min): 25 min  Charges:  $Gait Training: 8-22 mins $Therapeutic Activity: 8-22 mins                    G Codes:      Felecia Shelling  PTA [facility]  Acute  Rehab Pager      [phone]

## 2014-01-23 NOTE — Progress Notes (Signed)
Physical Therapy Treatment Patient Details Name: Katherine [name] MRN: [id number] DOB: [birth year] Today's Date: 01/23/2014    History of Present Illness Pt is a 80 year old female s/p L TKA    PT Comments    POD # 2 pm session.  Assisted pt OOB to amb in hallway then assisted back to bed for CPM.  Pt progressing slowly and plans to D/C to SNF.  Follow Up Recommendations  SNF [facility])     Equipment Recommendations       Recommendations for Other Services       Precautions / Restrictions Precautions Precautions: Knee;Fall Precaution Comments: instructed pt on KI use for amb Required Braces or Orthoses: Knee Immobilizer - Left Knee Immobilizer - Left: Discontinue once straight leg raise with < 10 degree lag Restrictions Weight Bearing Restrictions: No Other Position/Activity Restrictions: WBAT    Mobility  Bed Mobility Overal bed mobility: Needs Assistance Bed Mobility: Supine to Sit;Sit to Supine     Supine to sit: Mod assist     General bed mobility comments: pt required extra time and asssit esp to scoot to EOB.  Required greater assist to support b LE's onto bed.  Transfers Overall transfer level: Needs assistance Equipment used: Rolling walker (2 wheeled) Transfers: Sit to/from Stand Sit to Stand: Min assist;Mod assist         General transfer comment: verbal cues for technique, and increased time  Ambulation/Gait Ambulation/Gait assistance: Min assist Ambulation Distance (Feet): 12 Feet Assistive device: Rolling walker (2 wheeled) Gait Pattern/deviations: Step-to pattern Gait velocity: decreased   General Gait Details: verbal cues for sequence, RW distance, step length, plus increased time with difficulty weight shifting.  C/O B UE weakness using RW.   Stairs            Wheelchair Mobility    Modified Rankin (Stroke Patients Only)       Balance                                    Cognition                             Exercises      General Comments        Pertinent Vitals/Pain      Home Living                      Prior Function            PT Goals (current goals can now be found in the care plan section) Progress towards PT goals: Progressing toward goals    Frequency  7X/week    PT Plan      Co-evaluation             End of Session Equipment Utilized During Treatment: Left knee immobilizer Activity Tolerance: Patient limited by fatigue;Patient limited by pain Patient left: in bed;with call bell/phone within reach     Time: 1400-1425 PT Time Calculation (min): 25 min  Charges:  $Gait Training: 8-22 mins $Therapeutic Activity: 8-22 mins                    G Codes:      Felecia Shelling  PTA [facility]  Acute  Rehab Pager      [phone]

## 2014-01-23 NOTE — Progress Notes (Addendum)
Patient ID: Katherine [name], female   DOB: 01-12-1934, 80 y.o.   MRN: [id number]  [facility] PROGRESS NOTE  Katherine [name]:[id number] DOB: [birth year] DOA: 01/20/2014 PCP: [name], MD  Brief narrative: Pt is 80 yo female who presented to [facility] for left knee pain and underwent left knee arthroplasty. [facility] consulted to worsening Cr. Pt has known history of HTN, HLD, DM, has one kidney, CKD stage II - III with baseline Cr 1.5. She currently reports feeling well, mild pain in the left knee, no chest pain or shortness of breath, no abd concerns. Reports no urine output over the past 24 hours, minimal UOP this AM. Denies other urinary concerns.   Assessment and Plan:   Impression/Recommendations  Principal Problem:  Acute on chronic renal failure, stage II - III  - Cr up over the past 24 hours - bladder scan with > 250 cc PVR  - stop Lasix and Losartan until renal function stabilizes  - renal US requested this AM - may need nephrology consult  Low grade fever Tmax 99 F - with leukocytosis - will ask for UA and urine culture  - CBC in AM Addendum --> UA with trace leukocytes which where not present prior to procedure so will place on empiric Rocephin for now  Left knee OA  - status post left knee arthroplasty  - Plan for [facility] on Monday  - management per primary team  HTN  - reasonable inpatient control  - currently on Norvasc, Clonidine, Coreg - will continue same medical regimen  HLD  - continue statin  DM, uncontrolled with complications of renal disease stage II - III  - currently on Amaryl, Lantus QHS  - also on SSI while inpatient  - CBG's in 300's this AM  Anemia of chronic disease  - Hg drop over the past 24 hours - no signs of active bleeding - CBC in AM   I will followup again tomorrow. Please contact me if I can be of assistance in the meanwhile. Thank you for this consultation.   Code Status: Full Family Communication: Pt at bedside Disposition  Plan: Home when medically stable   IV Access:   Peripheral IV Procedures and diagnostic studies:    Knee arthroplasty   Medical Consultants:   [facility] Other Consultants:   Physical therapy  Anti-Infectives:   None  Debbora Presto, MD  [facility] Pager [phone]  If 7PM-7AM, please contact night-coverage [email or web address] Password [facility] 01/23/2014, 8:48 AM   LOS: 3 days   HPI/Subjective: No events overnight.   Objective: Filed Vitals:   01/22/14 1415 01/22/14 1600 01/22/14 2258 01/23/14 0607  BP: 112/62  132/51 122/77  Pulse: 64  66 67  Temp: 99 F (37.2 C)  98.9 F (37.2 C) 98.7 F (37.1 C)  TempSrc: Oral  Oral Oral  Resp: Height:      Weight:      SpO2: 92% 92% 94% 93%    Intake/Output Summary (Last 24 hours) at 01/23/14 0848 Last data filed at 01/23/14 0827  Gross per 24 hour  Intake    960 ml  Output    600 ml  Net    360 ml    Exam:   General:  Pt is alert, follows commands appropriately, not in acute distress  Cardiovascular: Regular rate and rhythm,  no rubs, no gallops  Respiratory: Clear to auscultation bilaterally, no wheezing, no crackles, no rhonchi  Abdomen: Soft, non tender, non  distended, bowel sounds present, no guarding   Data Reviewed: Basic Metabolic Panel:  Recent Labs Lab 01/21/14 0420 01/22/14 0456 01/22/14 1629 01/23/14 0500  NA 139 138 136* 139  K 3.7 4.1 3.9 4.0  CL 105 102 101 104  CO2 GLUCOSE 167* 303* 195* 268*  BUN 32* 51* 59* 70*  CREATININE 1.72* 2.75* 2.99* 3.52*  CALCIUM 8.7 8.7 8.4 8.1*   CBC:  Recent Labs Lab 01/21/14 0420 01/22/14 0456 01/23/14 0500  WBC 10.8* 13.0* 12.7*  HGB 10.7* 10.6* 9.6*  HCT 31.9* 31.9* 28.5*  MCV 80.4 80.8 79.6  PLT 187 191 189   CBG:  Recent Labs Lab 01/22/14 0711 01/22/14 1154 01/22/14 1740 01/22/14 2235 01/23/14 0812  GLUCAP 293* 229* 178* 273* 217*    Recent Results (from the past 240 hour(s))  SURGICAL PCR SCREEN     Status: None    Collection Time    01/13/14 10:21 AM      Result Value Ref Range Status   MRSA, PCR NEGATIVE  NEGATIVE Final   Staphylococcus aureus NEGATIVE  NEGATIVE Final   Comment:            The Xpert SA Assay (FDA     approved for NASAL specimens     in patients over 49 years of age),     is one component of     a comprehensive surveillance     program.  Test performance has     been validated by [employer] for patients greater     than or equal to 51 year old.     It is not intended     to diagnose infection nor to     guide or monitor treatment.     Scheduled Meds: . amLODipine  10 mg Oral q morning - 10a  . carvedilol  25 mg Oral BID WC  . cloNIDine  0.2 mg Oral q morning - 10a  . docusate sodium  100 mg Oral BID  . glimepiride  4 mg Oral BID  . insulin aspart  0-15 Units Subcutaneous TID WC  . insulin glargine  30 Units Subcutaneous QPM  . latanoprost  1 drop Both Eyes QHS  . linagliptin  5 mg Oral Daily   Continuous Infusions: . sodium chloride

## 2014-01-23 NOTE — Progress Notes (Signed)
   Subjective: 3 Days Post-Op Procedure(s) (LRB): LEFT TOTAL KNEE ARTHROPLASTY (Left)  Pt sitting up comfortably in no acute distress Mild pain in the left knee BUN and Cr have continued to increase  Appreciate medical consult yesterday Will plan to stop 2 medications and get renal u/s  Patient reports pain as mild.  Objective:   VITALS:   Filed Vitals:   01/23/14 0607  BP: 122/77  Pulse: 67  Temp: 98.7 F (37.1 C)  Resp: 16    Left knee incision healing well nv intact distally No rashes or edema  LABS  Recent Labs  01/21/14 0420 01/22/14 0456 01/23/14 0500  HGB 10.7* 10.6* 9.6*  HCT 31.9* 31.9* 28.5*  WBC 10.8* 13.0* 12.7*  PLT 187 191 189     Recent Labs  01/22/14 0456 01/22/14 1629 01/23/14 0500  NA 138 136* 139  K 4.1 3.9 4.0  BUN 51* 59* 70*  CREATININE 2.75* 2.99* 3.52*  GLUCOSE 303* 195* 268*     Assessment/Plan: 3 Days Post-Op Procedure(s) (LRB): LEFT TOTAL KNEE ARTHROPLASTY (Left) Will continue to work up the elevated Cr and BUN with renal u/s D/c may be delayed until renal function improves PT/OT as able Pain control  Incentive spirometry     Brad Dixon, MPAS, PA-C  01/23/2014, 7:55 AM

## 2014-01-23 NOTE — Progress Notes (Signed)
Medicare Important Message given?  YES (If response is "NO", the following Medicare IM given date fields will be blank) Date Medicare IM given:  01/23/2014 Medicare IM given by:  Javed Cotto 

## 2014-01-24 LAB — URINE CULTURE
Colony Count: NO GROWTH
Culture: NO GROWTH

## 2014-01-24 LAB — BASIC METABOLIC PANEL
ANION GAP: 12 (ref 5–15)
BUN: 71 mg/dL — ABNORMAL HIGH (ref 6–23)
CHLORIDE: 105 meq/L (ref 96–112)
CO2: 21 meq/L (ref 19–32)
CREATININE: 2.79 mg/dL — AB (ref 0.50–1.10)
Calcium: 8.3 mg/dL — ABNORMAL LOW (ref 8.4–10.5)
GFR calc non Af Amer: 15 mL/min — ABNORMAL LOW (ref >90)
GFR, EST AFRICAN AMERICAN: 18 mL/min — AB (ref >90)
Glucose, Bld: 131 mg/dL — ABNORMAL HIGH (ref 70–99)
POTASSIUM: 4.1 meq/L (ref 3.7–5.3)
Sodium: 138 mEq/L (ref 137–147)

## 2014-01-24 LAB — GLUCOSE, CAPILLARY
GLUCOSE-CAPILLARY: 103 mg/dL — AB (ref 70–99)
GLUCOSE-CAPILLARY: 114 mg/dL — AB (ref 70–99)
Glucose-Capillary: 139 mg/dL — ABNORMAL HIGH (ref 70–99)
Glucose-Capillary: 142 mg/dL — ABNORMAL HIGH (ref 70–99)

## 2014-01-24 LAB — CBC
HEMATOCRIT: 27.9 % — AB (ref 36.0–46.0)
Hemoglobin: 9.3 g/dL — ABNORMAL LOW (ref 12.0–15.0)
MCH: 26.6 pg (ref 26.0–34.0)
MCHC: 33.3 g/dL (ref 30.0–36.0)
MCV: 79.7 fL (ref 78.0–100.0)
Platelets: 195 10*3/uL (ref 150–400)
RBC: 3.5 MIL/uL — AB (ref 3.87–5.11)
RDW: 15.5 % (ref 11.5–15.5)
WBC: 9.3 10*3/uL (ref 4.0–10.5)

## 2014-01-24 MED ORDER — ENOXAPARIN SODIUM 30 MG/0.3ML ~~LOC~~ SOLN
30.0000 mg | SUBCUTANEOUS | Status: DC
  Administered 2014-01-24 – 2014-01-25 (×2): 30 mg via SUBCUTANEOUS
  Filled 2014-01-24 (×2): qty 0.3

## 2014-01-24 MED ORDER — CIPROFLOXACIN HCL 500 MG PO TABS: 500.0000 mg | ORAL_TABLET | Freq: Two times a day (BID) | ORAL | Status: DC
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
## 2014-01-24 NOTE — Progress Notes (Addendum)
Patient ID: Katherine [name], female   DOB: 01/24/1934, 80 y.o.   MRN: [id number]  [facility] PROGRESS NOTE  Katherine [name] WUJ:[id number] DOB: 04-29-1934 DOA: 01/20/2014 PCP: [name], MD  Brief narrative:  Pt is 80 yo female who presented to [facility] for left knee pain and underwent left knee arthroplasty. TR[facility] consulted to worsening Cr. Pt has known history of HTN, HLD, DM, has one kidney, CKD stage II - III with baseline Cr 1.5. She currently reports feeling well, mild pain in the left knee, no chest pain or shortness of breath, no abd concerns. Reports no urine output over the past 24 hours, minimal UOP this AM. Denies other urinary concerns.   Assessment and Plan:   Impression/Recommendations  Principal Problem:  Acute on chronic renal failure, stage II - III  - Cr trending down  - pt needs close follow up with PCP to have BMP repeated in one week - will complete med rec where I will specify to stop Losartan - HCTZ until renal function stablizes and will forward this note to pt's PCP  - may resume Lasix as well upon discharge  - renal US with no hydro evident  Low grade fever Tmax 99.3 F  - with leukocytosis as high as 13, WBC now WNL - started Rocephin 8/23, urine culture pending  - will change to oral Cipro to complete therapy for 4 more days Left knee OA  - status post left knee arthroplasty  - Plan for [facility] today, stable for d/c from internal med stand point  HTN  - reasonable inpatient control  - currently on Norvasc, Clonidine, Coreg  - it is all right to resume Lasix as it is very low dose and only once daily  - will continue same medical regimen  HLD  - continue statin  DM, uncontrolled with complications of renal disease stage II - III  - currently on Amaryl, Lantus QHS  - continue same medical regimen  Anemia of chronic disease  - Hg stable over the past 24 hours  - no signs of active bleeding   Code Status: Full  Family Communication: Pt at bedside   Disposition Plan: SNF today  IV Access:   Peripheral IV Procedures and diagnostic studies:   Knee arthroplasty   Medical Consultants:   [facility]  Other Consultants:   Physical therapy   Anti-Infectives:   Rocephin 8/23 --> 8/24 Cipro 8/24 --> 4 more days post discharge   Debbora Presto, MD  [facility] Pager [phone]  If 7PM-7AM, please contact night-coverage [email or web address] Password [facility] 01/24/2014, 8:19 AM   LOS: 4 days   HPI/Subjective: No events overnight.   Objective: Filed Vitals:   01/23/14 0607 01/23/14 1507 01/23/14 2132 01/24/14 0553  BP: 122/77 119/45 117/54 114/59  Pulse: 67 60 63 69  Temp: 98.7 F (37.1 C) 99.2 F (37.3 C) 99.3 F (37.4 C) 99.1 F (37.3 C)  TempSrc: Oral Oral Oral Oral  Resp: Height:      Weight:      SpO2: 93% 95% 92% 94%    Intake/Output Summary (Last 24 hours) at 01/24/14 0819 Last data filed at 01/24/14 0553  Gross per 24 hour  Intake 1206.66 ml  Output    800 ml  Net 406.66 ml    Exam:   General:  Pt is alert, follows commands appropriately, not in acute distress  Cardiovascular: Regular rate and rhythm, S1/S2, no murmurs, no rubs, no gallops  Respiratory: Clear to auscultation bilaterally, no wheezing, no crackles, no rhonchi  Abdomen: Soft, non tender, non distended, bowel sounds present, no guarding  Data Reviewed: Basic Metabolic Panel:  Recent Labs Lab 01/22/14 0456 01/22/14 1629 01/23/14 0500 01/23/14 1116 01/24/14 0443  NA 138 136* 139 139 138  K 4.1 3.9 4.0 3.7 4.1  CL 102 101 104 104 105  CO2 GLUCOSE 303* 195* 268* 186* 131*  BUN 51* 59* 70* 68* 71*  CREATININE 2.75* 2.99* 3.52* 3.33* 2.79*  CALCIUM 8.7 8.4 8.1* 8.4 8.3*   Liver Function Tests: No results found for this basename: AST, ALT, ALKPHOS, BILITOT, PROT, ALBUMIN,  in the last 168 hours No results found for this basename: LIPASE, AMYLASE,  in the last 168 hours No results found for this basename: AMMONIA,   in the last 168 hours CBC:  Recent Labs Lab 01/21/14 0420 01/22/14 0456 01/23/14 0500 01/24/14 0443  WBC 10.8* 13.0* 12.7* 9.3  HGB 10.7* 10.6* 9.6* 9.3*  HCT 31.9* 31.9* 28.5* 27.9*  MCV 80.4 80.8 79.6 79.7  PLT 187 191 189 195   Cardiac Enzymes: No results found for this basename: CKTOTAL, CKMB, CKMBINDEX, TROPONINI,  in the last 168 hours BNP: No components found with this basename: POCBNP,  CBG:  Recent Labs Lab 01/22/14 2235 01/23/14 0812 01/23/14 1131 01/23/14 1734 01/23/14 2113  GLUCAP 273* 217* 183* 178* 167*    No results found for this or any previous visit (from the past 240 hour(s)).   Scheduled Meds: . amLODipine  10 mg Oral q morning - 10a  . carvedilol  25 mg Oral BID WC  . cefTRIAXone (ROCEPHIN)  IV  1 g Intravenous Q24H  . cloNIDine  0.2 mg Oral q morning - 10a  . docusate sodium  100 mg Oral BID  . enoxaparin (LOVENOX) injection  30 mg Subcutaneous Q24H  . insulin aspart  0-15 Units Subcutaneous TID WC  . insulin glargine  30 Units Subcutaneous QPM  . latanoprost  1 drop Both Eyes QHS  . linagliptin  5 mg Oral Daily   Continuous Infusions: . sodium chloride 50 mL/hr at 01/23/14 1541

## 2014-01-24 NOTE — Progress Notes (Signed)
Physical Therapy Treatment Patient Details Name: Katherine [name] MRN: [id number] DOB: 26-Feb-1934 Today's Date: 01/24/2014    History of Present Illness Pt is a 80 year old female s/p L TKA    PT Comments    POD # 4 pt slept most of the am.  PM Tx assisted pt OOb to amb 2 very short distances as pt c/o max weakness/fatigue and 8/10 knee pain.  Performed some TKR TE's then applied ICE.  Pt progressing slowly and plans to D/C to SNF for ST Rehab.    Follow Up Recommendations        Equipment Recommendations       Recommendations for Other Services       Precautions / Restrictions Precautions Precautions: Knee;Fall Precaution Comments: instructed pt on KI use for amb Required Braces or Orthoses: Knee Immobilizer - Left Knee Immobilizer - Left: Discontinue once straight leg raise with < 10 degree lag Restrictions Weight Bearing Restrictions: No Other Position/Activity Restrictions: WBAT    Mobility  Bed Mobility Overal bed mobility: Needs Assistance Bed Mobility: Supine to Sit;Sit to Supine     Supine to sit: Mod assist     General bed mobility comments: pt required extra time and asssit esp to scoot to EOB.  Required greater assist to support b LE's onto bed.  Transfers Overall transfer level: Needs assistance Equipment used: Rolling walker (2 wheeled) Transfers: Sit to/from Stand Sit to Stand: Mod assist         General transfer comment: verbal cues for technique, and increased time  Ambulation/Gait Ambulation/Gait assistance: Min assist Ambulation Distance (Feet): 6 Feet Assistive device: Rolling walker (2 wheeled) Gait Pattern/deviations: Step-to pattern Gait velocity: decreased   General Gait Details: verbal cues for sequence, RW distance, step length, plus increased time with difficulty weight shifting.  C/O B UE weakness using RW.   Stairs            Wheelchair Mobility    Modified Rankin (Stroke Patients Only)       Balance                                    Cognition                            Exercises   Total Knee Replacement TE's 10 reps B LE ankle pumps 10 reps towel squeezes 10 reps knee presses 10 reps heel slides  10 reps SAQ's 10 reps SLR's 10 reps ABD Followed by ICE     General Comments        Pertinent Vitals/Pain      Home Living                      Prior Function            PT Goals (current goals can now be found in the care plan section)      Frequency       PT Plan      Co-evaluation             End of Session           Time: 1400-1440 PT Time Calculation (min): 40 min  Charges:  $Gait Training: 8-22 mins $Therapeutic Exercise: 8-22 mins $Therapeutic Activity: 8-22 mins  G Codes:      Rica Koyanagi  PTA [facility]  Acute  Rehab Pager      [phone]

## 2014-01-25 DIAGNOSIS — N189 Chronic kidney disease, unspecified: Secondary | ICD-10-CM

## 2014-01-25 DIAGNOSIS — N179 Acute kidney failure, unspecified: Secondary | ICD-10-CM | POA: Diagnosis not present

## 2014-01-25 LAB — BASIC METABOLIC PANEL
Anion gap: 11 (ref 5–15)
BUN: 65 mg/dL — AB (ref 6–23)
CALCIUM: 8.5 mg/dL (ref 8.4–10.5)
CO2: 22 mEq/L (ref 19–32)
CREATININE: 2.18 mg/dL — AB (ref 0.50–1.10)
Chloride: 108 mEq/L (ref 96–112)
GFR, EST AFRICAN AMERICAN: 24 mL/min — AB (ref >90)
GFR, EST NON AFRICAN AMERICAN: 20 mL/min — AB (ref >90)
GLUCOSE: 123 mg/dL — AB (ref 70–99)
Potassium: 4 mEq/L (ref 3.7–5.3)
Sodium: 141 mEq/L (ref 137–147)

## 2014-01-25 LAB — URINALYSIS, ROUTINE W REFLEX MICROSCOPIC
Bilirubin Urine: NEGATIVE
GLUCOSE, UA: NEGATIVE mg/dL
Ketones, ur: NEGATIVE mg/dL
Nitrite: NEGATIVE
PROTEIN: 100 mg/dL — AB
Specific Gravity, Urine: 1.016 (ref 1.005–1.030)
Urobilinogen, UA: 0.2 mg/dL (ref 0.0–1.0)
pH: 5.5 (ref 5.0–8.0)

## 2014-01-25 LAB — URINE MICROSCOPIC-ADD ON

## 2014-01-25 LAB — GLUCOSE, CAPILLARY
GLUCOSE-CAPILLARY: 176 mg/dL — AB (ref 70–99)
Glucose-Capillary: 90 mg/dL (ref 70–99)

## 2014-01-25 MED ORDER — OXYCODONE HCL 5 MG PO TABS: 5.0000 mg | ORAL_TABLET | ORAL | Status: DC | PRN

## 2014-01-25 MED ORDER — BISACODYL 10 MG RE SUPP
10.0000 mg | Freq: Once | RECTAL | Status: AC
  Administered 2014-01-25: 10 mg via RECTAL
  Filled 2014-01-25: qty 1

## 2014-01-25 MED ORDER — METOCLOPRAMIDE HCL 5 MG PO TABS: 5.0000 mg | ORAL_TABLET | Freq: Three times a day (TID) | ORAL | Status: DC | PRN

## 2014-01-25 MED ORDER — METHOCARBAMOL 500 MG PO TABS: 500.0000 mg | ORAL_TABLET | Freq: Four times a day (QID) | ORAL | Status: DC | PRN

## 2014-01-25 MED ORDER — ACETAMINOPHEN 325 MG PO TABS: 650.0000 mg | ORAL_TABLET | Freq: Four times a day (QID) | ORAL | Status: DC | PRN

## 2014-01-25 MED ORDER — RIVAROXABAN 10 MG PO TABS: 10.0000 mg | ORAL_TABLET | Freq: Every day | ORAL | Status: DC

## 2014-01-25 MED ORDER — ONDANSETRON HCL 4 MG PO TABS: 4.0000 mg | ORAL_TABLET | Freq: Four times a day (QID) | ORAL | Status: DC | PRN

## 2014-01-25 MED ORDER — BISACODYL 10 MG RE SUPP: 10.0000 mg | Freq: Every day | RECTAL | Status: DC | PRN

## 2014-01-25 MED ORDER — TRAMADOL HCL 50 MG PO TABS
50.0000 mg | ORAL_TABLET | Freq: Four times a day (QID) | ORAL | Status: DC | PRN
Start: 2014-01-25 — End: 2015-08-21

## 2014-01-25 NOTE — Progress Notes (Signed)
Physical Therapy Treatment Patient Details Name: Katherine [name] MRN: [id number] DOB: 1933-06-05 Today's Date: 01/25/2014    History of Present Illness Pt is a 80 year old female s/p L TKA    PT Comments    POD # 5 am session.  Assisted OOB to [facility].  Assisted with hygiene then amb limited distance.  Pt progressing slowly and plans to D/C to SNF.    Follow Up Recommendations  SNF ([facility])     Equipment Recommendations       Recommendations for Other Services       Precautions / Restrictions Precautions Precautions: Knee;Fall Precaution Comments: D/C KI  POD # 5 Restrictions Weight Bearing Restrictions: No Other Position/Activity Restrictions: WBAT    Mobility  Bed Mobility Overal bed mobility: Needs Assistance Bed Mobility: Supine to Sit     Supine to sit: Min assist     General bed mobility comments: increased time   Transfers Overall transfer level: Needs assistance Equipment used: Rolling walker (2 wheeled) Transfers: Sit to/from Stand Sit to Stand: Min assist         General transfer comment: verbal cues for technique, and increased time  Ambulation/Gait Ambulation/Gait assistance: Min assist Ambulation Distance (Feet): 6 Feet Assistive device: Rolling walker (2 wheeled) Gait Pattern/deviations: Step-to pattern Gait velocity: decreased   General Gait Details: verbal cues for sequence, RW distance, step length, plus increased time with difficulty weight shifting.  C/O B UE weakness using RW.   Stairs            Wheelchair Mobility    Modified Rankin (Stroke Patients Only)       Balance                                    Cognition                            Exercises      General Comments        Pertinent Vitals/Pain      Home Living                      Prior Function            PT Goals (current goals can now be found in the care plan section) Progress towards PT goals:  Progressing toward goals    Frequency  7X/week    PT Plan      Co-evaluation             End of Session Equipment Utilized During Treatment: Gait belt Activity Tolerance: Patient limited by fatigue;Patient limited by pain Patient left: in chair;with call bell/phone within reach     Time: 0937-1005 PT Time Calculation (min): 28 min  Charges:  $Gait Training: 8-22 mins $Therapeutic Activity: 8-22 mins                    G Codes:      Felecia Shelling  PTA [facility]  Acute  Rehab Pager      [phone]

## 2014-01-25 NOTE — Progress Notes (Signed)
Physical Therapy Treatment Patient Details Name: Katherine [name] MRN: [id number] DOB: 1933-09-02 Today's Date: 01/25/2014    History of Present Illness Pt is a 80 year old female s/p L TKA    PT Comments    Returned to perform TKR TE's  Follow Up Recommendations  SNF ([facility])     Equipment Recommendations       Recommendations for Other Services     Exercises Total Knee Replacement TE's 10 reps B LE ankle pumps 10 reps towel squeezes 10 reps knee presses 10 reps heel slides  10 reps SAQ's 10 reps SLR's 10 reps ABD Followed by ICE      Precautions / Restrictions Precautions Precautions: Knee;Fall Precaution Comments: D/C KI  POD # 5 Restrictions Weight Bearing Restrictions: No Other Position/Activity Restrictions: WBAT      PT Goals (current goals can now be found in the care plan section) Progress towards PT goals: Progressing toward goals    Frequency  7X/week    PT Plan      Co-evaluation             End of Session Equipment Utilized During Treatment: Gait belt Activity Tolerance: Patient limited by fatigue;Patient limited by pain Patient left: in chair;with call bell/phone within reach     Time: 1046-1105 PT Time Calculation (min): 19 min  Charges:   $Therapeutic Exercise: 8-22 mins                     G Codes:      Felecia Shelling  PTA [facility]  Acute  Rehab Pager      [phone]
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
## 2014-01-25 NOTE — Progress Notes (Addendum)
Pt seen and examined at bedside. TRH consulted for worsening renal failure.  - Cr trending down  - pt needs close follow up with PCP to have BMP repeated in one week  - completed med rec where I stopped Losartan - HCTZ until renal function stablizes  - note forwarded to PCP - may resume Lasix upon discharge  - pt stable for d/c  Please note that urine culture with no growth so will stop Ciprofloxacin and will remove from med rec list.  Will sign off, please call me with any questions.   Debbora Presto, MD  [facility] Pager [phone] Cell [phone]  If 7PM-7AM, please contact night-coverage [email or web address] Password [email or web address]
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
## 2014-01-25 NOTE — Progress Notes (Signed)
Clinical Social Work Department CLINICAL SOCIAL WORK PLACEMENT NOTE 01/25/2014  Patient:  Katherine [name], Katherine [name]  Account Number:  [id number] Admit date:  01/20/2014  Clinical Social Worker:  Cori Razor, LCSW  Date/time:  01/21/2014 01:20 PM  Clinical Social Work is seeking post-discharge placement for this patient at the following level of care:   SKILLED NURSING   (*CSW will update this form in Epic as items are completed)     Patient/family provided with [facility] Department of Clinical Social Work's list of facilities offering this level of care within the geographic area requested by the patient (or if unable, by the patient's family).  01/21/2014  Patient/family informed of their freedom to choose among providers that offer the needed level of care, that participate in Medicare, Medicaid or managed care program needed by the patient, have an available bed and are willing to accept the patient.    Patient/family informed of [facility]' ownership interest in [facility], as well as of the fact that they are under no obligation to receive care at this facility.  PASARR submitted to EDS on 01/21/2014 PASARR number received on 01/21/2014  FL2 transmitted to all facilities in geographic area requested by pt/family on  01/21/2014 FL2 transmitted to all facilities within larger geographic area on   Patient informed that his/her managed care company has contracts with or will negotiate with  certain facilities, including the following:     Patient/family informed of bed offers received:  01/21/2014 Patient chooses bed at [facility] Physician recommends and patient chooses bed at    Patient to be transferred to [facility] on  01/25/2014 Patient to be transferred to facility by  Patient and family notified of transfer on 01/25/2014 Name of family member notified:  Pt declined csw assistance.  The following physician request were entered in Epic:   Additional  Comments: Pt is in agreement with d/c to sNF today. P-[facility] transport required. NSG reviewed d/c d/c summary, scripts, avs. Scripts are not required at [facility].  Cori Razor LCSW [phone]

## 2014-01-25 NOTE — Progress Notes (Signed)
Patient d/c'd to [facility].  Report called to [facility] in Nursing.  Patient transported to SNF via ambulance.  Dorothyann Peng RN

## 2014-01-26 ENCOUNTER — Non-Acute Institutional Stay (SKILLED_NURSING_FACILITY): Payer: Medicare Other | Admitting: Adult Health

## 2014-01-26 DIAGNOSIS — E1165 Type 2 diabetes mellitus with hyperglycemia: Secondary | ICD-10-CM

## 2014-01-26 DIAGNOSIS — M1712 Unilateral primary osteoarthritis, left knee: Secondary | ICD-10-CM

## 2014-01-26 DIAGNOSIS — IMO0001 Reserved for inherently not codable concepts without codable children: Secondary | ICD-10-CM

## 2014-01-26 DIAGNOSIS — I151 Hypertension secondary to other renal disorders: Secondary | ICD-10-CM

## 2014-01-26 DIAGNOSIS — N182 Chronic kidney disease, stage 2 (mild): Secondary | ICD-10-CM

## 2014-01-26 DIAGNOSIS — E785 Hyperlipidemia, unspecified: Secondary | ICD-10-CM

## 2014-01-26 DIAGNOSIS — M1710 Unilateral primary osteoarthritis, unspecified knee: Secondary | ICD-10-CM

## 2014-01-26 DIAGNOSIS — I150 Renovascular hypertension: Secondary | ICD-10-CM

## 2014-01-26 DIAGNOSIS — N2889 Other specified disorders of kidney and ureter: Secondary | ICD-10-CM

## 2014-01-26 DIAGNOSIS — K5900 Constipation, unspecified: Secondary | ICD-10-CM

## 2014-01-26 LAB — URINE CULTURE
Colony Count: NO GROWTH
Culture: NO GROWTH
SPECIAL REQUESTS: NORMAL

## 2014-01-27 ENCOUNTER — Non-Acute Institutional Stay (SKILLED_NURSING_FACILITY): Payer: Medicare Other | Admitting: Internal Medicine

## 2014-01-27 DIAGNOSIS — I151 Hypertension secondary to other renal disorders: Secondary | ICD-10-CM

## 2014-01-27 DIAGNOSIS — IMO0001 Reserved for inherently not codable concepts without codable children: Secondary | ICD-10-CM

## 2014-01-27 DIAGNOSIS — E1165 Type 2 diabetes mellitus with hyperglycemia: Secondary | ICD-10-CM

## 2014-01-27 DIAGNOSIS — M1710 Unilateral primary osteoarthritis, unspecified knee: Secondary | ICD-10-CM

## 2014-01-27 DIAGNOSIS — M1712 Unilateral primary osteoarthritis, left knee: Secondary | ICD-10-CM

## 2014-01-27 DIAGNOSIS — I150 Renovascular hypertension: Secondary | ICD-10-CM

## 2014-01-27 DIAGNOSIS — N182 Chronic kidney disease, stage 2 (mild): Secondary | ICD-10-CM

## 2014-01-27 DIAGNOSIS — N2889 Other specified disorders of kidney and ureter: Secondary | ICD-10-CM

## 2014-01-28 ENCOUNTER — Encounter: Payer: Self-pay | Admitting: Adult Health

## 2014-01-28 NOTE — Progress Notes (Signed)
Patient ID: Katherine [name], female   DOB: 25-Nov-1933, 80 y.o.   MRN: [id number]               PROGRESS NOTE  DATE: 01/26/2014  FACILITY: Nursing Home Location: [facility]  LEVEL OF CARE: SNF (31)  Acute Visit  CHIEF COMPLAINT:  Follow-up Hospitalization  HISTORY OF PRESENT ILLNESS: This is a 80 year old female who has been admitted to [facility] on 01/25/14 from [facility] with Osteoarthritis S/P left total knee arthroplasty. She has been admitted for a short-term rehabilitation.  REASSESSMENT OF ONGOING PROBLEM(S):  HTN: Pt 's HTN remains stable.  Denies CP, sob, DOE, headaches, dizziness or visual disturbances.  No complications from the medications currently being used.  Last BP : 132/58  DM:pt's DM remains stable.  Pt denies polyuria, polydipsia, polyphagia, changes in vision or hypoglycemic episodes.  No complications noted from the medication presently being used.   8/15 hemoglobin A1c is: 8.6  CHRONIC KIDNEY DISEASE: The patient's chronic kidney disease remains stable.  Patient denies increasing lower extremity swelling or confusion. 8/15 BUN  65 and creatinine 2.18   PAST MEDICAL HISTORY : Reviewed.  No changes/see problem list  CURRENT MEDICATIONS: Reviewed per MAR/see medication list  REVIEW OF SYSTEMS:  GENERAL: no change in appetite, no fatigue, no weight changes, no fever, chills or weakness RESPIRATORY: no cough, SOB, DOE, wheezing, hemoptysis CARDIAC: no chest pain, or palpitations, +edema GI: no abdominal pain, diarrhea, constipation, heart burn, nausea or vomiting  PHYSICAL EXAMINATION  GENERAL: no acute distress, morbidly obese EYES: conjunctivae normal, sclerae normal, normal eye lids NECK: supple, trachea midline, no neck masses, no thyroid tenderness, no thyromegaly LYMPHATICS: no LAN in the neck, no supraclavicular LAN RESPIRATORY: breathing is even & unlabored, BS CTAB CARDIAC: RRR, no murmur,no extra heart sounds, BLE  edema 2+ GI: abdomen soft, normal BS, no masses, no tenderness, no hepatomegaly, no splenomegaly EXTREMITIES:  Able to move all 4 extremities; limited ROM on LLE due to surgery PSYCHIATRIC: the patient is alert & oriented to person, affect & behavior appropriate  LABS/RADIOLOGY: Labs reviewed: Basic Metabolic Panel:  Recent Labs  13/08/65 1116 01/24/14 0443 01/25/14 0420  NA 139 138 141  K 3.7 4.1 4.0  CL 104 105 108  CO2 GLUCOSE 186* 131* 123*  BUN 68* 71* 65*  CREATININE 3.33* 2.79* 2.18*  CALCIUM 8.4 8.3* 8.5   Liver Function Tests:  Recent Labs  01/13/14 1150  AST 21  ALT 17  ALKPHOS 177*  BILITOT 0.5  PROT 7.7  ALBUMIN 3.1*   CBC:  Recent Labs  01/22/14 0456 01/23/14 0500 01/24/14 0443  WBC 13.0* 12.7* 9.3  HGB 10.6* 9.6* 9.3*  HCT 31.9* 28.5* 27.9*  MCV 80.8 79.6 79.7  PLT 191 189 195   CBG:  Recent Labs  01/24/14 2139 01/25/14 0748 01/25/14 1201  GLUCAP 114* 90 176*    EXAM: CHEST  2 VIEW   COMPARISON:  PA and lateral chest x-ray of February 03, 2012.   FINDINGS: The lungs are adequately inflated. There is no focal infiltrate. The heart is top-normal in size but stable. The pulmonary vascularity is normal. There is no pleural effusion. There is mild degenerative change of the right shoulder and of multiple thoracic disc levels.   IMPRESSION: There is no acute cardiopulmonary abnormality. EXAM: RENAL/URINARY TRACT ULTRASOUND COMPLETE   COMPARISON:  CT abdomen and pelvis 02/03/2009   FINDINGS: Right Kidney:   Length: 11.5  cm. Limited visualization. Parenchymal thickness appears normal. No hydronephrosis.   Left Kidney:   Left kidney is not visualized sonographically. Previous CT scan demonstrates diffuse calcification of an atrophic left kidney.   Bladder:   Decompressed with Foley catheter.  Not visualized.   IMPRESSION: Limited visualization. No evidence of hydronephrosis on the right kidney. Left kidney not  visualized but was shown to be atrophic and calcified on previous CT.   ASSESSMENT/PLAN:  Osteoarthritis status post left total knee arthroplasty - for short-term rehabilitation  Chronic kidney disease, stage 2-3 - check BMP Hypertension - continue Norvasc, carvedilol and clonidine Diabetes mellitus, type II - continue Glimiperide, Januvia and Lantus Hyperlipidemia - continue Crestor Constipation - continue Colace  CPT CODE: 86578  Ella Bodo - NP [facility] [phone]
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
## 2014-02-02 ENCOUNTER — Encounter: Payer: Self-pay | Admitting: Adult Health

## 2014-02-02 DIAGNOSIS — K5900 Constipation, unspecified: Secondary | ICD-10-CM

## 2014-02-02 DIAGNOSIS — I151 Hypertension secondary to other renal disorders: Secondary | ICD-10-CM

## 2014-02-02 DIAGNOSIS — N2889 Other specified disorders of kidney and ureter: Secondary | ICD-10-CM

## 2014-02-02 DIAGNOSIS — E785 Hyperlipidemia, unspecified: Secondary | ICD-10-CM

## 2014-02-02 DIAGNOSIS — E1165 Type 2 diabetes mellitus with hyperglycemia: Secondary | ICD-10-CM

## 2014-02-02 DIAGNOSIS — M1712 Unilateral primary osteoarthritis, left knee: Secondary | ICD-10-CM

## 2014-02-02 DIAGNOSIS — IMO0001 Reserved for inherently not codable concepts without codable children: Secondary | ICD-10-CM

## 2014-02-02 DIAGNOSIS — N182 Chronic kidney disease, stage 2 (mild): Secondary | ICD-10-CM

## 2014-02-11 ENCOUNTER — Emergency Department (HOSPITAL_COMMUNITY): Payer: Medicare Other

## 2014-02-11 ENCOUNTER — Inpatient Hospital Stay (HOSPITAL_COMMUNITY)
Admission: EM | Admit: 2014-02-11 | Discharge: 2014-02-14 | DRG: 291 | Disposition: A | Discharge status: Home Health | Payer: Medicare Other | Attending: Internal Medicine | Admitting: Internal Medicine

## 2014-02-11 ENCOUNTER — Inpatient Hospital Stay (HOSPITAL_COMMUNITY): Payer: Medicare Other

## 2014-02-11 ENCOUNTER — Encounter (HOSPITAL_COMMUNITY): Payer: Self-pay | Admitting: Emergency Medicine

## 2014-02-11 DIAGNOSIS — Z7901 Long term (current) use of anticoagulants: Secondary | ICD-10-CM

## 2014-02-11 DIAGNOSIS — N179 Acute kidney failure, unspecified: Secondary | ICD-10-CM | POA: Diagnosis present

## 2014-02-11 DIAGNOSIS — R110 Nausea: Secondary | ICD-10-CM

## 2014-02-11 DIAGNOSIS — N189 Chronic kidney disease, unspecified: Secondary | ICD-10-CM

## 2014-02-11 DIAGNOSIS — Z794 Long term (current) use of insulin: Secondary | ICD-10-CM

## 2014-02-11 DIAGNOSIS — E1165 Type 2 diabetes mellitus with hyperglycemia: Secondary | ICD-10-CM

## 2014-02-11 DIAGNOSIS — IMO0001 Reserved for inherently not codable concepts without codable children: Secondary | ICD-10-CM | POA: Diagnosis present

## 2014-02-11 DIAGNOSIS — M179 Osteoarthritis of knee, unspecified: Secondary | ICD-10-CM | POA: Diagnosis present

## 2014-02-11 DIAGNOSIS — Z87891 Personal history of nicotine dependence: Secondary | ICD-10-CM | POA: Diagnosis not present

## 2014-02-11 DIAGNOSIS — N039 Chronic nephritic syndrome with unspecified morphologic changes: Secondary | ICD-10-CM | POA: Diagnosis present

## 2014-02-11 DIAGNOSIS — M79609 Pain in unspecified limb: Secondary | ICD-10-CM

## 2014-02-11 DIAGNOSIS — J9600 Acute respiratory failure, unspecified whether with hypoxia or hypercapnia: Secondary | ICD-10-CM | POA: Diagnosis present

## 2014-02-11 DIAGNOSIS — E1129 Type 2 diabetes mellitus with other diabetic kidney complication: Secondary | ICD-10-CM | POA: Diagnosis present

## 2014-02-11 DIAGNOSIS — A088 Other specified intestinal infections: Secondary | ICD-10-CM | POA: Diagnosis present

## 2014-02-11 DIAGNOSIS — I5031 Acute diastolic (congestive) heart failure: Principal | ICD-10-CM | POA: Diagnosis present

## 2014-02-11 DIAGNOSIS — D631 Anemia in chronic kidney disease: Secondary | ICD-10-CM | POA: Diagnosis present

## 2014-02-11 DIAGNOSIS — J9601 Acute respiratory failure with hypoxia: Secondary | ICD-10-CM | POA: Diagnosis present

## 2014-02-11 DIAGNOSIS — M7989 Other specified soft tissue disorders: Secondary | ICD-10-CM

## 2014-02-11 DIAGNOSIS — Z6841 Body Mass Index (BMI) 40.0 and over, adult: Secondary | ICD-10-CM | POA: Diagnosis not present

## 2014-02-11 DIAGNOSIS — R0902 Hypoxemia: Secondary | ICD-10-CM | POA: Diagnosis present

## 2014-02-11 DIAGNOSIS — I129 Hypertensive chronic kidney disease with stage 1 through stage 4 chronic kidney disease, or unspecified chronic kidney disease: Secondary | ICD-10-CM | POA: Diagnosis present

## 2014-02-11 DIAGNOSIS — E7800 Pure hypercholesterolemia, unspecified: Secondary | ICD-10-CM | POA: Diagnosis present

## 2014-02-11 DIAGNOSIS — Z96659 Presence of unspecified artificial knee joint: Secondary | ICD-10-CM

## 2014-02-11 DIAGNOSIS — IMO0002 Reserved for concepts with insufficient information to code with codable children: Secondary | ICD-10-CM | POA: Diagnosis present

## 2014-02-11 DIAGNOSIS — Z79899 Other long term (current) drug therapy: Secondary | ICD-10-CM

## 2014-02-11 DIAGNOSIS — N182 Chronic kidney disease, stage 2 (mild): Secondary | ICD-10-CM | POA: Diagnosis present

## 2014-02-11 DIAGNOSIS — E162 Hypoglycemia, unspecified: Secondary | ICD-10-CM

## 2014-02-11 DIAGNOSIS — I509 Heart failure, unspecified: Secondary | ICD-10-CM

## 2014-02-11 DIAGNOSIS — M1710 Unilateral primary osteoarthritis, unspecified knee: Secondary | ICD-10-CM | POA: Diagnosis present

## 2014-02-11 DIAGNOSIS — I10 Essential (primary) hypertension: Secondary | ICD-10-CM

## 2014-02-11 LAB — CBC WITH DIFFERENTIAL/PLATELET
BASOS PCT: 0 % (ref 0–1)
Basophils Absolute: 0 10*3/uL (ref 0.0–0.1)
Eosinophils Absolute: 0.1 10*3/uL (ref 0.0–0.7)
Eosinophils Relative: 1 % (ref 0–5)
HCT: 30.5 % — ABNORMAL LOW (ref 36.0–46.0)
HEMOGLOBIN: 9.9 g/dL — AB (ref 12.0–15.0)
LYMPHS ABS: 0.8 10*3/uL (ref 0.7–4.0)
Lymphocytes Relative: 10 % — ABNORMAL LOW (ref 12–46)
MCH: 26.5 pg (ref 26.0–34.0)
MCHC: 32.5 g/dL (ref 30.0–36.0)
MCV: 81.8 fL (ref 78.0–100.0)
MONOS PCT: 10 % (ref 3–12)
Monocytes Absolute: 0.8 10*3/uL (ref 0.1–1.0)
NEUTROS PCT: 79 % — AB (ref 43–77)
Neutro Abs: 5.9 10*3/uL (ref 1.7–7.7)
Platelets: 226 10*3/uL (ref 150–400)
RBC: 3.73 MIL/uL — AB (ref 3.87–5.11)
RDW: 15.4 % (ref 11.5–15.5)
WBC: 7.6 10*3/uL (ref 4.0–10.5)

## 2014-02-11 LAB — URINALYSIS, ROUTINE W REFLEX MICROSCOPIC
Bilirubin Urine: NEGATIVE
GLUCOSE, UA: 100 mg/dL — AB
Ketones, ur: NEGATIVE mg/dL
Leukocytes, UA: NEGATIVE
Nitrite: NEGATIVE
Protein, ur: >300 mg/dL — AB
SPECIFIC GRAVITY, URINE: 1.017 (ref 1.005–1.030)
UROBILINOGEN UA: 1 mg/dL (ref 0.0–1.0)
pH: 6 (ref 5.0–8.0)

## 2014-02-11 LAB — COMPREHENSIVE METABOLIC PANEL
ALBUMIN: 2.4 g/dL — AB (ref 3.5–5.2)
ALK PHOS: 139 U/L — AB (ref 39–117)
ALT: 7 U/L (ref 0–35)
AST: 19 U/L (ref 0–37)
Anion gap: 11 (ref 5–15)
BILIRUBIN TOTAL: 0.6 mg/dL (ref 0.3–1.2)
BUN: 12 mg/dL (ref 6–23)
CO2: 24 mEq/L (ref 19–32)
Calcium: 8.9 mg/dL (ref 8.4–10.5)
Chloride: 106 mEq/L (ref 96–112)
Creatinine, Ser: 1.35 mg/dL — ABNORMAL HIGH (ref 0.50–1.10)
GFR calc Af Amer: 42 mL/min — ABNORMAL LOW (ref >90)
GFR calc non Af Amer: 37 mL/min — ABNORMAL LOW (ref >90)
GLUCOSE: 175 mg/dL — AB (ref 70–99)
POTASSIUM: 3.9 meq/L (ref 3.7–5.3)
SODIUM: 141 meq/L (ref 137–147)
TOTAL PROTEIN: 6.4 g/dL (ref 6.0–8.3)

## 2014-02-11 LAB — PROTIME-INR
INR: 1.1 (ref 0.00–1.49)
Prothrombin Time: 14.2 seconds (ref 11.6–15.2)

## 2014-02-11 LAB — URINE MICROSCOPIC-ADD ON

## 2014-02-11 LAB — PRO B NATRIURETIC PEPTIDE: Pro B Natriuretic peptide (BNP): 1646 pg/mL — ABNORMAL HIGH (ref 0–450)

## 2014-02-11 LAB — APTT: APTT: 23 s — AB (ref 24–37)

## 2014-02-11 LAB — LIPASE, BLOOD: Lipase: 47 U/L (ref 11–59)

## 2014-02-11 LAB — GLUCOSE, CAPILLARY
GLUCOSE-CAPILLARY: 145 mg/dL — AB (ref 70–99)
Glucose-Capillary: 173 mg/dL — ABNORMAL HIGH (ref 70–99)
Glucose-Capillary: 170 mg/dL — ABNORMAL HIGH (ref 70–99)

## 2014-02-11 LAB — HEPARIN LEVEL (UNFRACTIONATED): Heparin Unfractionated: <0.1 IU/mL — ABNORMAL LOW (ref 0.30–0.70)

## 2014-02-11 MED ORDER — ACETAMINOPHEN 650 MG RE SUPP: 650.0000 mg | Freq: Four times a day (QID) | RECTAL | Status: DC | PRN

## 2014-02-11 MED ORDER — HYDRALAZINE HCL 20 MG/ML IJ SOLN: 10.0000 mg | Freq: Four times a day (QID) | INTRAMUSCULAR | Status: DC | PRN

## 2014-02-11 MED ORDER — ONDANSETRON HCL 4 MG PO TABS
4.0000 mg | ORAL_TABLET | Freq: Four times a day (QID) | ORAL | Status: DC | PRN
  Administered 2014-02-14 (×2): 4 mg via ORAL
  Filled 2014-02-11 (×2): qty 1

## 2014-02-11 MED ORDER — HEPARIN (PORCINE) IN NACL 100-0.45 UNIT/ML-% IJ SOLN
1550.0000 [IU]/h | INTRAMUSCULAR | Status: DC
Start: 2014-02-11 — End: 2014-02-11
  Administered 2014-02-11: 1550 [IU]/h via INTRAVENOUS
  Filled 2014-02-11: qty 250

## 2014-02-11 MED ORDER — CARVEDILOL 25 MG PO TABS
25.0000 mg | ORAL_TABLET | Freq: Two times a day (BID) | ORAL | Status: DC
  Administered 2014-02-11 – 2014-02-14 (×6): 25 mg via ORAL
  Filled 2014-02-11 (×9): qty 1

## 2014-02-11 MED ORDER — CLONIDINE HCL 0.2 MG PO TABS
0.2000 mg | ORAL_TABLET | Freq: Every morning | ORAL | Status: DC
  Administered 2014-02-11 – 2014-02-14 (×4): 0.2 mg via ORAL
  Filled 2014-02-11 (×4): qty 1

## 2014-02-11 MED ORDER — HEPARIN BOLUS VIA INFUSION
3000.0000 [IU] | Freq: Once | INTRAVENOUS | Status: AC
  Administered 2014-02-11: 3000 [IU] via INTRAVENOUS
  Filled 2014-02-11: qty 3000

## 2014-02-11 MED ORDER — AMLODIPINE BESYLATE 10 MG PO TABS
10.0000 mg | ORAL_TABLET | Freq: Every morning | ORAL | Status: DC
  Administered 2014-02-11: 10 mg via ORAL
  Filled 2014-02-11: qty 1

## 2014-02-11 MED ORDER — TECHNETIUM TO 99M ALBUMIN AGGREGATED
5.5000 | Freq: Once | INTRAVENOUS | Status: AC | PRN
  Administered 2014-02-11: 6 via INTRAVENOUS

## 2014-02-11 MED ORDER — INSULIN ASPART 100 UNIT/ML ~~LOC~~ SOLN: 0.0000 [IU] | Freq: Every day | SUBCUTANEOUS | Status: DC

## 2014-02-11 MED ORDER — ONDANSETRON HCL 4 MG/2ML IJ SOLN
4.0000 mg | Freq: Once | INTRAMUSCULAR | Status: AC
  Administered 2014-02-11: 4 mg via INTRAVENOUS
  Filled 2014-02-11: qty 2

## 2014-02-11 MED ORDER — FUROSEMIDE 10 MG/ML IJ SOLN
40.0000 mg | Freq: Two times a day (BID) | INTRAMUSCULAR | Status: DC
  Administered 2014-02-11 – 2014-02-12 (×2): 40 mg via INTRAVENOUS
  Filled 2014-02-11 (×4): qty 4

## 2014-02-11 MED ORDER — METHOCARBAMOL 1000 MG/10ML IJ SOLN
500.0000 mg | Freq: Four times a day (QID) | INTRAVENOUS | Status: DC | PRN
  Filled 2014-02-11: qty 5

## 2014-02-11 MED ORDER — ONDANSETRON HCL 4 MG/2ML IJ SOLN
4.0000 mg | INTRAMUSCULAR | Status: DC | PRN
  Filled 2014-02-11: qty 2

## 2014-02-11 MED ORDER — MORPHINE SULFATE 2 MG/ML IJ SOLN: 2.0000 mg | Freq: Four times a day (QID) | INTRAMUSCULAR | Status: DC | PRN

## 2014-02-11 MED ORDER — LATANOPROST 0.005 % OP SOLN
1.0000 [drp] | Freq: Every day | OPHTHALMIC | Status: DC
  Administered 2014-02-11 – 2014-02-13 (×3): 1 [drp] via OPHTHALMIC
  Filled 2014-02-11: qty 2.5

## 2014-02-11 MED ORDER — DEXTROSE 5 % IV SOLN
1.0000 g | Freq: Once | INTRAVENOUS | Status: AC
  Administered 2014-02-11: 1 g via INTRAVENOUS
  Filled 2014-02-11: qty 10

## 2014-02-11 MED ORDER — ACETAMINOPHEN 325 MG PO TABS: 650.0000 mg | ORAL_TABLET | Freq: Four times a day (QID) | ORAL | Status: DC | PRN

## 2014-02-11 MED ORDER — TRAMADOL HCL 50 MG PO TABS: 50.0000 mg | ORAL_TABLET | Freq: Four times a day (QID) | ORAL | Status: DC | PRN

## 2014-02-11 MED ORDER — INSULIN ASPART 100 UNIT/ML ~~LOC~~ SOLN
0.0000 [IU] | Freq: Three times a day (TID) | SUBCUTANEOUS | Status: DC
  Administered 2014-02-12: 3 [IU] via SUBCUTANEOUS
  Administered 2014-02-13: 2 [IU] via SUBCUTANEOUS
  Administered 2014-02-14: 3 [IU] via SUBCUTANEOUS

## 2014-02-11 MED ORDER — INSULIN GLARGINE 100 UNIT/ML ~~LOC~~ SOLN
30.0000 [IU] | Freq: Every evening | SUBCUTANEOUS | Status: DC
  Administered 2014-02-11 – 2014-02-12 (×2): 30 [IU] via SUBCUTANEOUS
  Filled 2014-02-11 (×2): qty 0.3

## 2014-02-11 NOTE — Progress Notes (Signed)
Preliminary doppler results negative.  Will stop heparin drip per Dr. Valora Piccolo verbal order.  Have notified Vernona Rieger, pharmacist.

## 2014-02-11 NOTE — Progress Notes (Signed)
VASCULAR LAB PRELIMINARY  PRELIMINARY  PRELIMINARY  PRELIMINARY  Bilateral lower extremity venous duplex completed.    Preliminary report:  Bilateral:  No  Obvious evidence of DVT, superficial thrombosis, or Baker's Cyst. Technically limited due to body habitus   Nekia Maxham, RVS 02/11/2014, 3:19 PM

## 2014-02-11 NOTE — Progress Notes (Addendum)
Ms Katherine [name] daughter, Ms Katherine [name], saw chaplain's badge and after brief introductory discussions, asked the chaplain to visit Ms Katherine [name].   Ms Katherine [name] is an active [place] who is hanging on to her faith as her health is deteriorating. She was delighted with the visit and request further spiritual care in the future.  Please page the chaplain at [phone] at any time when Ms Katherine [name] requests or required spiritual care,  Benjie Karvonen. Wendall Isabell, DMin, MDiv, MA Chaplain
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
## 2014-02-11 NOTE — ED Notes (Signed)
Called to give report to [facility] 1610 stated unable to get report at this time.

## 2014-02-11 NOTE — ED Notes (Signed)
Bed: ZO10 Expected date: 02/11/14 Expected time: 6:28 AM Means of arrival: Ambulance Comments: Multiple complaints

## 2014-02-11 NOTE — ED Notes (Signed)
Per EMS, pt had left knee sx on 01/20/14 and states that she has been having n/v ever since.  Pt states that she has not taken any of her home meds for the last week including her blood pressure meds or her insulin d/t her n/v.  Pt received 4 mg of Zofran enroute. CBG = 141.

## 2014-02-11 NOTE — ED Notes (Signed)
MD at bedside, unable to transport patient.

## 2014-02-12 DIAGNOSIS — I379 Nonrheumatic pulmonary valve disorder, unspecified: Secondary | ICD-10-CM

## 2014-02-12 DIAGNOSIS — N179 Acute kidney failure, unspecified: Secondary | ICD-10-CM

## 2014-02-12 DIAGNOSIS — I10 Essential (primary) hypertension: Secondary | ICD-10-CM

## 2014-02-12 DIAGNOSIS — N189 Chronic kidney disease, unspecified: Secondary | ICD-10-CM

## 2014-02-12 LAB — CBC
HCT: 30.6 % — ABNORMAL LOW (ref 36.0–46.0)
Hemoglobin: 9.5 g/dL — ABNORMAL LOW (ref 12.0–15.0)
MCH: 25.8 pg — AB (ref 26.0–34.0)
MCHC: 31 g/dL (ref 30.0–36.0)
MCV: 83.2 fL (ref 78.0–100.0)
PLATELETS: 232 10*3/uL (ref 150–400)
RBC: 3.68 MIL/uL — ABNORMAL LOW (ref 3.87–5.11)
RDW: 15.6 % — AB (ref 11.5–15.5)
WBC: 6.7 10*3/uL (ref 4.0–10.5)

## 2014-02-12 LAB — BASIC METABOLIC PANEL
Anion gap: 10 (ref 5–15)
BUN: 13 mg/dL (ref 6–23)
CALCIUM: 8.4 mg/dL (ref 8.4–10.5)
CO2: 25 mEq/L (ref 19–32)
CREATININE: 1.84 mg/dL — AB (ref 0.50–1.10)
Chloride: 108 mEq/L (ref 96–112)
GFR, EST AFRICAN AMERICAN: 29 mL/min — AB (ref >90)
GFR, EST NON AFRICAN AMERICAN: 25 mL/min — AB (ref >90)
GLUCOSE: 50 mg/dL — AB (ref 70–99)
Potassium: 3.3 mEq/L — ABNORMAL LOW (ref 3.7–5.3)
Sodium: 143 mEq/L (ref 137–147)

## 2014-02-12 LAB — GLUCOSE, CAPILLARY
GLUCOSE-CAPILLARY: 144 mg/dL — AB (ref 70–99)
Glucose-Capillary: 40 mg/dL — CL (ref 70–99)
Glucose-Capillary: 53 mg/dL — ABNORMAL LOW (ref 70–99)
Glucose-Capillary: 119 mg/dL — ABNORMAL HIGH (ref 70–99)
Glucose-Capillary: 160 mg/dL — ABNORMAL HIGH (ref 70–99)
Glucose-Capillary: 108 mg/dL — ABNORMAL HIGH (ref 70–99)
Glucose-Capillary: 168 mg/dL — ABNORMAL HIGH (ref 70–99)

## 2014-02-12 LAB — TSH: TSH: 1.03 u[IU]/mL (ref 0.350–4.500)

## 2014-02-12 MED ORDER — LORAZEPAM 0.5 MG PO TABS
0.5000 mg | ORAL_TABLET | Freq: Once | ORAL | Status: DC
  Filled 2014-02-12: qty 1

## 2014-02-12 MED ORDER — FUROSEMIDE 40 MG PO TABS
40.0000 mg | ORAL_TABLET | Freq: Two times a day (BID) | ORAL | Status: DC
  Administered 2014-02-12 – 2014-02-14 (×4): 40 mg via ORAL
  Filled 2014-02-12 (×7): qty 1

## 2014-02-12 MED ORDER — RIVAROXABAN 10 MG PO TABS
10.0000 mg | ORAL_TABLET | Freq: Every day | ORAL | Status: DC
  Administered 2014-02-12 – 2014-02-14 (×3): 10 mg via ORAL
  Filled 2014-02-12 (×4): qty 1

## 2014-02-12 MED ORDER — OXYCODONE HCL 5 MG PO TABS
5.0000 mg | ORAL_TABLET | Freq: Four times a day (QID) | ORAL | Status: DC | PRN
  Administered 2014-02-13 – 2014-02-14 (×2): 5 mg via ORAL
  Filled 2014-02-12 (×2): qty 1

## 2014-02-12 NOTE — Progress Notes (Signed)
CARE MANAGEMENT NOTE 02/12/2014  Patient:  Katherine [name], Katherine [name]   Account Number:  [id number]  Date Initiated:  02/12/2014  Documentation initiated by:  [facility]  Subjective/Objective Assessment:   mild pulmonary edema     Action/Plan:   Anticipated DC Date:     Anticipated DC Plan:  HOME W HOME HEALTH SERVICES      DC Planning Services  CM consult      [facility] Choice  Resumption Of Svcs/PTA Provider   Choice offered to / List presented to:  C-1 Patient           Status of service:  In process, will continue to follow Medicare Important Message given?   (If response is "NO", the following Medicare IM given date fields will be blank) Date Medicare IM given:   Medicare IM given by:   Date Additional Medicare IM given:   Additional Medicare IM given by:    Discharge Disposition:    Per UR Regulation:    If discussed at Long Length of Stay Meetings, dates discussed:    Comments:  02/12/2014 1130 Pt had left knee replacement 3 weeks ago and was dc from rehab. Will follow up with [facility] for status on [facility]. Isidoro Donning RN CCM Case Mgmt phone [phone]

## 2014-02-12 NOTE — Progress Notes (Signed)
Ms Mccay is positive in her approach to her health condition and is determined to do what is necessary to get better. She reports that her faith is strong and it is providing comfort to her. Prayer provided at her request.  Benjie Karvonen. Bular [place]ok, DMin Chaplain

## 2014-02-12 NOTE — Progress Notes (Signed)
[name] has been performed.  Katherine [name] 02/12/2014, 1:36 PM

## 2014-02-12 NOTE — Progress Notes (Signed)
[facility] PROGRESS NOTE  Katherine [name] ZOX:[id number] DOB: [birth year] DOA: 02/11/2014 PCP: [name], MD  Assessment/Plan: Acute respiratory failure with hypoxia  Possibly secondary to acute CHF . Low probability for PE on VQ scan . Doppler LE negative.  -stable telemetry  - lasix 40 mg bid. Monitor strict daily weight and  I/O . Switch to po lasix -Check 2D echo and TSH    Active Problems:  Ongoing nausea   Possibly viral gastroenteritis . Improved and tolerating diet  prn antiemetics.   DM (diabetes mellitus)  Continue  home lantus and place on SSI. Last A1C of 8.6   HTN (hypertension)  continue home BP meds.   Recent left knee surgery  Resumed xarelto . Continue pain meds . Reports poor mobility. PT eval.   Acute-on-chronic renal failure  worsened this am due to diuretics. Monitor closely  abdominal wall mass  reports to be chronic and has not changed in size. Non tender  . Previous CT from 2010 shows ventral abdominal wall fluid collection.    Anemia  Possibly secondary to chr disease   Diet: diabetic  DVT prophylaxis: xarelto    Code Status: full Family Communication:none at bedside Disposition Plan: home possibly in 24-48hrs   Consultants:  none  Procedures:  VQ scan  2D echo  Antibiotics:  none  HPI/Subjective: Nausea better and tolerating diet. Dyspnea improved as well  Objective: Filed Vitals:   02/12/14 0619  BP: 132/51  Pulse: 65  Temp: 97.6 F (36.4 C)  Resp: 18    Intake/Output Summary (Last 24 hours) at 02/12/14 1147 Last data filed at 02/12/14 0900  Gross per 24 hour  Intake 479.68 ml  Output    250 ml  Net 229.68 ml   Filed Weights   02/11/14 0706 02/11/14 1547  Weight: 131.543 kg (290 lb) 131.543 kg (290 lb)    Exam:   General:  Elderly morbidly obese female in NAD  Cardiovascular: NS1&S2, no murmurs  Respiratory:  improved left basilar crackles, no rhonchi or wheeze  Abdomen: soft , firm  palpable abd wall mass, non tender, BS+  Ext warm, 1+ pitting edema b/l    Data Reviewed: Basic Metabolic Panel:  Recent Labs Lab 02/11/14 0800 02/12/14 0516  NA 141 143  K 3.9 3.3*  CL 106 108  CO2 24 25  GLUCOSE 175* 50*  BUN 12 13  CREATININE 1.35* 1.84*  CALCIUM 8.9 8.4   Liver Function Tests:  Recent Labs Lab 02/11/14 0800  AST 19  ALT 7  ALKPHOS 139*  BILITOT 0.6  PROT 6.4  ALBUMIN 2.4*    Recent Labs Lab 02/11/14 0800  LIPASE 47   No results found for this basename: AMMONIA,  in the last 168 hours CBC:  Recent Labs Lab 02/11/14 0800 02/12/14 0516  WBC 7.6 6.7  NEUTROABS 5.9  --   HGB 9.9* 9.5*  HCT 30.5* 30.6*  MCV 81.8 83.2  PLT 226 232   Cardiac Enzymes: No results found for this basename: CKTOTAL, CKMB, CKMBINDEX, TROPONINI,  in the last 168 hours BNP (last 3 results)  Recent Labs  02/11/14 0800  PROBNP 1646.0*   CBG:  Recent Labs Lab 02/11/14 1219 02/11/14 2206 02/12/14 0754 02/12/14 0819 02/12/14 0918  GLUCAP 170* 145* 40* 53* 119*    No results found for this or any previous visit (from the past 240 hour(s)).   Studies: Dg Chest 2 View  02/11/2014   CLINICAL DATA:  Hypoxia and shortness  of breath.  EXAM: CHEST  2 VIEW  COMPARISON:  01/13/2014  FINDINGS: The heart is enlarged. Small bilateral pleural effusions are present. There is prominence of interstitial markings, best seen on the lateral view. Findings are consistent with interstitial pulmonary edema. There are no focal consolidations.  IMPRESSION: Cardiomegaly, mild pulmonary edema.   Electronically Signed   By: Rosalie Gums M.D.   On: 02/11/2014 10:49   Nm Pulmonary Perfusion  02/11/2014   CLINICAL DATA:  Hypoxia. Status post knee surgery on 01/20/2014. Nausea and vomiting.  EXAM: NUCLEAR MEDICINE VENTILATION - PERFUSION LUNG SCAN  TECHNIQUE: Wash-in, equilibrium, and wash-out phase ventilation images were obtained using Xe-133 gas. Perfusion images were obtained in  multiple projections after intravenous injection of Tc-51m MAA.  RADIOPHARMACEUTICALS:  5.5 mCi Tc-27m MAA.  COMPARISON:  No priors.  FINDINGS: Per report from the technologist, the patient was unable to lay flat and breathe during the examination. She was unable to complete the perfusion portion of the scan, and unable to perform the ventilation portion of the examination. As such, only a limited perfusion scan was performed, including anterior, LAO, posterior an RPO views. With these limitations in mind, there are no perfusion defects noted.  IMPRESSION: 1. Exceedingly limited perfusion only scan demonstrating no perfusion defects. This is a low probability study.   Electronically Signed   By: Trudie Reed M.D.   On: 02/11/2014 14:09    Scheduled Meds: . carvedilol  25 mg Oral BID WC  . cloNIDine  0.2 mg Oral q morning - 10a  . furosemide  40 mg Oral BID  . insulin aspart  0-15 Units Subcutaneous TID WC  . insulin aspart  0-5 Units Subcutaneous QHS  . insulin glargine  30 Units Subcutaneous QPM  . latanoprost  1 drop Both Eyes QHS  . rivaroxaban  10 mg Oral Q breakfast   Continuous Infusions:     Time spent: 25 minutes    Katherine [name]  [facility] Pager [phone]. If 7PM-7AM, please contact night-coverage at [email or web address], password [facility] 02/12/2014, 11:47 AM  LOS: 1 day

## 2014-02-12 NOTE — Progress Notes (Signed)
Pharmacist Heart Failure Core Measure Documentation  Assessment: Katherine [name] has an EF documented as 60-65% on 02/05/12 by Laqueta Carina. New ECHO has been ordered.  Rationale: Heart failure patients with left ventricular systolic dysfunction (LVSD) and an EF < 40% should be prescribed an angiotensin converting enzyme inhibitor (ACEI) or angiotensin receptor blocker (ARB) at discharge unless a contraindication is documented in the medical record.  This patient is not currently on an ACEI or ARB for HF.  This note is being placed in the record in order to provide documentation that a contraindication to the use of these agents is present for this encounter.  ACE Inhibitor or Angiotensin Receptor Blocker is contraindicated (specify all that apply)    ACEI allergy AND ARB allergy   Angioedema   Moderate or severe aortic stenosis   Hyperkalemia   Hypotension   Renal artery stenosis   Worsening renal function, preexisting renal disease or dysfunction   Katherine [name] 02/12/2014 2:57 PM

## 2014-02-13 DIAGNOSIS — E162 Hypoglycemia, unspecified: Secondary | ICD-10-CM

## 2014-02-13 LAB — BASIC METABOLIC PANEL
ANION GAP: 11 (ref 5–15)
BUN: 14 mg/dL (ref 6–23)
CHLORIDE: 107 meq/L (ref 96–112)
CO2: 26 meq/L (ref 19–32)
CREATININE: 1.77 mg/dL — AB (ref 0.50–1.10)
Calcium: 8.1 mg/dL — ABNORMAL LOW (ref 8.4–10.5)
GFR calc Af Amer: 31 mL/min — ABNORMAL LOW (ref >90)
GFR calc non Af Amer: 26 mL/min — ABNORMAL LOW (ref >90)
Glucose, Bld: 130 mg/dL — ABNORMAL HIGH (ref 70–99)
Potassium: 3.5 mEq/L — ABNORMAL LOW (ref 3.7–5.3)
SODIUM: 144 meq/L (ref 137–147)

## 2014-02-13 LAB — GLUCOSE, CAPILLARY
Glucose-Capillary: 47 mg/dL — ABNORMAL LOW (ref 70–99)
Glucose-Capillary: 98 mg/dL (ref 70–99)
Glucose-Capillary: 121 mg/dL — ABNORMAL HIGH (ref 70–99)
Glucose-Capillary: 125 mg/dL — ABNORMAL HIGH (ref 70–99)
Glucose-Capillary: 104 mg/dL — ABNORMAL HIGH (ref 70–99)

## 2014-02-13 MED ORDER — INSULIN GLARGINE 100 UNIT/ML ~~LOC~~ SOLN
25.0000 [IU] | Freq: Every evening | SUBCUTANEOUS | Status: DC
  Administered 2014-02-13: 25 [IU] via SUBCUTANEOUS
  Filled 2014-02-13 (×2): qty 0.25

## 2014-02-13 NOTE — Progress Notes (Signed)
Writer alerted by MD to have pt strict I&O.  Be advised that pt IS incontinent at times and "leaks". I&O will be recorded.

## 2014-02-13 NOTE — Progress Notes (Signed)
[facility] PROGRESS NOTE  Katherine [name] ZOX:[id number] DOB: [birth year] DOA: 02/11/2014 PCP: [name], MD  Assessment/Plan: Acute respiratory failure with hypoxia  Possibly secondary to acute CHF . Low probability for PE on VQ scan . Doppler LE negative.  -stable telemetry  - lasix 40 mg po bid. Needs  strict daily weight and  I/O .  -Check 2D echo with diastolic dysfunction and TSH    Active Problems:  Ongoing nausea   Possibly viral gastroenteritis . Improved and tolerating diet. Reports poor appetite. Will obtain nutrition consult  prn antiemetics.   DM (diabetes mellitus)  Has low fsg this am. lantus dose adjusted.  Last A1C of 8.6   HTN (hypertension)  continue home BP meds.   Recent left knee surgery  Resumed xarelto . Continue pain meds . Reports poor mobility. PT eval. Pt wish to go home with services  Acute-on-chronic renal failure  Slowly improving  abdominal wall mass  reports to be chronic and has not changed in size. Non tender  . Previous CT from 2010 shows ventral abdominal wall fluid collection.    Anemia  Possibly secondary to chr disease   Diet: diabetic  DVT prophylaxis: xarelto    Code Status: full Family Communication:none at bedside Disposition Plan: home possibly in am   Consultants:  none  Procedures:  VQ scan  2D echo  Antibiotics:  none  HPI/Subjective: Nausea better and tolerating diet. Dyspnea improved as well  Objective: Filed Vitals:   02/13/14 0659  BP: 135/55  Pulse: 75  Temp: 99.1 F (37.3 C)  Resp: 17    Intake/Output Summary (Last 24 hours) at 02/13/14 1419 Last data filed at 02/13/14 1011  Gross per 24 hour  Intake    240 ml  Output    500 ml  Net   -260 ml   Filed Weights   02/11/14 0706 02/11/14 1547  Weight: 131.543 kg (290 lb) 131.543 kg (290 lb)    Exam:   General:  NAD  Cardiovascular: NS1&S2, no murmurs  Respiratory:  Fine  left basilar crackles, no rhonchi or  wheeze  Abdomen: soft , firm palpable abd wall mass, non tender, BS+  Ext warm, 1+ pitting edema b/l    Data Reviewed: Basic Metabolic Panel:  Recent Labs Lab 02/11/14 0800 02/12/14 0516 02/13/14 0530  NA 141 143 144  K 3.9 3.3* 3.5*  CL 106 108 107  CO2 GLUCOSE 175* 50* 130*  BUN CREATININE 1.35* 1.84* 1.77*  CALCIUM 8.9 8.4 8.1*   Liver Function Tests:  Recent Labs Lab 02/11/14 0800  AST 19  ALT 7  ALKPHOS 139*  BILITOT 0.6  PROT 6.4  ALBUMIN 2.4*    Recent Labs Lab 02/11/14 0800  LIPASE 47   No results found for this basename: AMMONIA,  in the last 168 hours CBC:  Recent Labs Lab 02/11/14 0800 02/12/14 0516  WBC 7.6 6.7  NEUTROABS 5.9  --   HGB 9.9* 9.5*  HCT 30.5* 30.6*  MCV 81.8 83.2  PLT 226 232   Cardiac Enzymes: No results found for this basename: CKTOTAL, CKMB, CKMBINDEX, TROPONINI,  in the last 168 hours BNP (last 3 results)  Recent Labs  02/11/14 0800  PROBNP 1646.0*   CBG:  Recent Labs Lab 02/12/14 2203 02/13/14 0333 02/13/14 0427 02/13/14 0708 02/13/14 1217  GLUCAP 144* 47* 98 121* 125*    No results found for this or any previous visit (from  the past 240 hour(s)).   Studies: No results found.  Scheduled Meds: . carvedilol  25 mg Oral BID WC  . cloNIDine  0.2 mg Oral q morning - 10a  . furosemide  40 mg Oral BID  . insulin aspart  0-15 Units Subcutaneous TID WC  . insulin aspart  0-5 Units Subcutaneous QHS  . insulin glargine  25 Units Subcutaneous QPM  . latanoprost  1 drop Both Eyes QHS  . LORazepam  0.5 mg Oral Once  . rivaroxaban  10 mg Oral Q breakfast   Continuous Infusions:     Time spent: 25 minutes    Katherine [name]  [facility] Pager [phone]. If 7PM-7AM, please contact night-coverage at [email or web address], password [facility] 02/13/2014, 2:19 PM  LOS: 2 days

## 2014-02-13 NOTE — Progress Notes (Signed)
CARE MANAGEMENT NOTE 02/13/2014  Patient:  Katherine [name], Katherine [name]   Account Number:  [id number]  Date Initiated:  02/12/2014  Documentation initiated by:  [facility]  Subjective/Objective Assessment:   mild pulmonary edema     Action/Plan:   Anticipated DC Date:     Anticipated DC Plan:  HOME W HOME HEALTH SERVICES      DC Planning Services  CM consult      [facility] Choice  Resumption Of Svcs/PTA Provider   Choice offered to / List presented to:  C-1 Patient           Status of service:  In process, will continue to follow Medicare Important Message given?   (If response is "NO", the following Medicare IM given date fields will be blank) Date Medicare IM given:   Medicare IM given by:   Date Additional Medicare IM given:   Additional Medicare IM given by:    Discharge Disposition:    Per UR Regulation:    If discussed at Long Length of Stay Meetings, dates discussed:    Comments:  02/13/2014 1200 Pt active with [facility] for [facility]. Isidoro Donning RN CCM Case Mgmt phone [phone]  02/12/2014 1130 Pt had left knee replacement 3 weeks ago and was dc from rehab. Will follow up with [facility] for status on [facility]. Isidoro Donning RN CCM Case Mgmt phone [phone]

## 2014-02-13 NOTE — Plan of Care (Signed)
Problem: Phase I Progression Outcomes Goal: OOB as tolerated unless otherwise ordered Outcome: Progressing Has PT order. RN/NT will dangle on the side of bed and get her oob today for the first time

## 2014-02-13 NOTE — Evaluation (Signed)
Physical Therapy Evaluation Patient Details Name: Katherine [name] MRN: [id number] DOB: 1933/10/06 Today's Date: 02/13/2014   History of Present Illness  Pt is a 80 year old female adm with hypoxia, recent TKA on 01/20/14. PMHx:  HTN, DM, CKD--one kidney  Clinical Impression  Pt will benefit from PT to address deficits below; Pt supervision to min/guard for basic mobility/amb today; pt with c/o fatigue after amb but sats 95% on RA, HR 75; should be able to D/C with HHPT and dtr has lined up caregiver 4x/wk as well per pt;    Follow Up Recommendations Home health PT;Supervision - Intermittent    Equipment Recommendations  None recommended by PT    Recommendations for Other Services       Precautions / Restrictions Precautions Precautions: Knee;Fall Required Braces or Orthoses: Knee Immobilizer - Left Knee Immobilizer - Left: Discontinue once straight leg raise with < 10 degree lag Restrictions Other Position/Activity Restrictions: WBAT      Mobility  Bed Mobility                  Transfers Overall transfer level: Needs assistance Equipment used: Rolling walker (2 wheeled) Transfers: Sit to/from Stand Sit to Stand: Min guard;Supervision Stand pivot transfers: Min guard       General transfer comment: verbal cues for hand placement, requires  increased time  Ambulation/Gait Ambulation/Gait assistance: Min guard;Supervision Ambulation Distance (Feet): 40 Feet (x 2) Assistive device: Rolling walker (2 wheeled) Gait Pattern/deviations: Step-to pattern Gait velocity: decreased Gait velocity interpretation: Below normal speed for age/gender General Gait Details: pt with c/o fatigue, occasional cues for RW safety  Stairs            Wheelchair Mobility    Modified Rankin (Stroke Patients Only)       Balance Overall balance assessment: Needs assistance           Standing balance-Leahy Scale: Fair                                Pertinent Vitals/Pain Pain Assessment: No/denies pain    Home Living Family/patient expects to be discharged to:: Private residence Living Arrangements: Alone   Type of Home: House Home Access: Ramped entrance     Home Layout: One level Home Equipment: Cane - single point;Walker - 2 wheels Additional Comments: [employer] to come in 4days per wk    Prior Function Level of Independence: Independent with assistive device(s)         Comments: amb with walker     Hand Dominance        Extremity/Trunk Assessment   Upper Extremity Assessment: Generalized weakness           Lower Extremity Assessment: Generalized weakness   LLE Deficits / Details: able to flex ~95*actively     Communication   Communication: No difficulties  Cognition Arousal/Alertness: Awake/alert Behavior During Therapy: WFL for tasks assessed/performed Overall Cognitive Status: Within Functional Limits for tasks assessed                      General Comments      Exercises        Assessment/Plan    PT Assessment Patient needs continued PT services  PT Diagnosis Difficulty walking   PT Problem List Decreased strength;Decreased range of motion;Decreased mobility;Obesity;Decreased knowledge of precautions;Decreased balance  PT Treatment Interventions     PT Goals (Current goals can be found  in the Care Plan section) Acute Rehab PT Goals PT Goal Formulation: With patient Time For Goal Achievement: 02/20/14 Potential to Achieve Goals: Good    Frequency Min 3X/week   Barriers to discharge        Co-evaluation               End of Session Equipment Utilized During Treatment: Gait belt Activity Tolerance: Patient limited by fatigue Patient left: in chair;with call bell/phone within reach Nurse Communication: Mobility status         Time: 1610-9604 PT Time Calculation (min): 27 min   Charges:   PT Evaluation $Initial PT Evaluation Tier I: 1  Procedure PT Treatments $Gait Training: 23-37 mins   PT G CodesDrucilla Chalet 03-02-2014, 4:34 PM

## 2014-02-13 NOTE — Plan of Care (Signed)
Problem: Phase I Progression Outcomes Goal: Initial discharge plan identified Outcome: Completed/Met Date Met:  02/13/14 Per pt her daughter has contacted [employer] and they will be coming out 4 x a week. The other time she has a friend that will be there. No interested in Short term rehab

## 2014-02-13 NOTE — ED Notes (Signed)
Opened chart to try and locate a bag with medications in it. Unable to locate.

## 2014-02-13 NOTE — Progress Notes (Signed)
Hypoglycemic Event  CBG: 47  Treatment: 15 GM carbohydrate snack  Symptoms: Sweaty  Follow-up CBG: Time:0420 CBG Result:98  Possible Reasons for Event: Unknown  Comments/MD notified:M. Burnadette Peter, NP     Katherine [name]  Remember to initiate Hypoglycemia Order Set & complete

## 2014-02-14 DIAGNOSIS — I5031 Acute diastolic (congestive) heart failure: Principal | ICD-10-CM

## 2014-02-14 LAB — BASIC METABOLIC PANEL
Anion gap: 12 (ref 5–15)
BUN: 14 mg/dL (ref 6–23)
CHLORIDE: 105 meq/L (ref 96–112)
CO2: 28 meq/L (ref 19–32)
CREATININE: 1.62 mg/dL — AB (ref 0.50–1.10)
Calcium: 8.6 mg/dL (ref 8.4–10.5)
GFR calc Af Amer: 34 mL/min — ABNORMAL LOW (ref >90)
GFR calc non Af Amer: 29 mL/min — ABNORMAL LOW (ref >90)
Glucose, Bld: 91 mg/dL (ref 70–99)
Potassium: 3.3 mEq/L — ABNORMAL LOW (ref 3.7–5.3)
Sodium: 145 mEq/L (ref 137–147)

## 2014-02-14 LAB — CBC
HEMATOCRIT: 31 % — AB (ref 36.0–46.0)
HEMOGLOBIN: 9.8 g/dL — AB (ref 12.0–15.0)
MCH: 25.9 pg — AB (ref 26.0–34.0)
MCHC: 31.6 g/dL (ref 30.0–36.0)
MCV: 81.8 fL (ref 78.0–100.0)
Platelets: 227 10*3/uL (ref 150–400)
RBC: 3.79 MIL/uL — ABNORMAL LOW (ref 3.87–5.11)
RDW: 15.4 % (ref 11.5–15.5)
WBC: 6.7 10*3/uL (ref 4.0–10.5)

## 2014-02-14 LAB — GLUCOSE, CAPILLARY
GLUCOSE-CAPILLARY: 149 mg/dL — AB (ref 70–99)
GLUCOSE-CAPILLARY: 153 mg/dL — AB (ref 70–99)
Glucose-Capillary: 69 mg/dL — ABNORMAL LOW (ref 70–99)
Glucose-Capillary: 94 mg/dL (ref 70–99)

## 2014-02-14 MED ORDER — POTASSIUM CHLORIDE CRYS ER 20 MEQ PO TBCR
40.0000 meq | EXTENDED_RELEASE_TABLET | Freq: Once | ORAL | Status: AC
  Administered 2014-02-14: 40 meq via ORAL
  Filled 2014-02-14: qty 2

## 2014-02-14 MED ORDER — INSULIN GLARGINE 100 UNIT/ML ~~LOC~~ SOLN: 25.0000 [IU] | Freq: Every evening | SUBCUTANEOUS | Status: DC

## 2014-02-14 MED ORDER — ASPIRIN EC 81 MG PO TBEC
81.0000 mg | DELAYED_RELEASE_TABLET | Freq: Every day | ORAL | Status: DC
Start: 2014-02-14 — End: 2015-12-09

## 2014-02-14 MED ORDER — MORPHINE SULFATE 2 MG/ML IJ SOLN: 2.0000 mg | Freq: Four times a day (QID) | INTRAMUSCULAR | Status: DC | PRN

## 2014-02-14 MED ORDER — POTASSIUM CHLORIDE ER 20 MEQ PO TBCR: 8.0000 meq | EXTENDED_RELEASE_TABLET | Freq: Every day | ORAL | Status: DC

## 2014-02-14 MED ORDER — FUROSEMIDE 40 MG PO TABS: 40.0000 mg | ORAL_TABLET | Freq: Every day | ORAL | Status: DC

## 2014-02-14 NOTE — Progress Notes (Signed)
Pt selected [facility] for HHPT. Referral given to in house rep with [place].

## 2014-02-14 NOTE — Discharge Summary (Addendum)
Physician Discharge Summary  Katherine [name]:[id number] DOB: Oct 28, 1933 DOA: 02/11/2014  PCP: [name], MD  Admit date: 02/11/2014 Discharge date: 02/14/2014  Time spent: 35 minutes  Recommendations for Outpatient Follow-up:  1. D/c home with [facility] and PT 2. Follow up with PCP in 1 week. Please check k and renal function. xarelto discontinued ( prescribed for DVT prophylaxis post op ) and resumed home dose baby aspirin  Discharge Diagnoses:  Principal Problem:   Acute respiratory failure with hypoxia  Active Problems:   Acute diastolic CHF (congestive heart failure)   DM (diabetes mellitus)   HTN (hypertension)   Morbid obesity   CKD (chronic kidney disease) stage 2, GFR 60-89 ml/min   OA (osteoarthritis) of knee   Acute-on-chronic renal failure   Hypoxia   Nausea alone     Discharge Condition: fair  Diet recommendation: cardiac/ heart healthy  Filed Weights   02/11/14 0706 02/11/14 1547  Weight: 131.543 kg (290 lb) 131.543 kg (290 lb)    History of present illness:  80 y/o morbidly obese female with history of hypertension, chronic kidney disease stage II (has single kidney) type 2 diabetes mellitus, uncontrolled with last A1c of 8.6, osteoarthritis of the knee status post total left knee arthroplasty few weeks back and discharged to rehab on xarelto for DVT prophylaxis. Patient to return home recently but for the past 5 days she has been having ongoing nausea with difficult to eat or drink anything and including take her medications. She reports that she has not taken most of her medications including xarelto. She denies any vomiting or abdominal pain, denies any fever or chills. She reports some shortness of breath but denies any chest pain or palpitations. Denies any diarrhea or urinary symptoms.   Course in the ED  patient was found to be hypoxic in the 80s on room air and placed on 2 L via nasal cannula. She was also found to have bibasilar crackles and an 80  edema with chest x-ray showing findings of mild pulmonary edema.  Blood work done showed hemoglobin of 9.9 and creatinine 1.35. Pro BNP was elevated to 7000.  Given concern for PE she was placed on IV heparin drip and hospitalist admission requested.      Hospital Course:  Acute respiratory failure with hypoxia  Possibly secondary to acute CHF . Low probability for PE on VQ scan . Doppler LE negative.  -stable on  telemetry  - placed on lasix 40 mg po bid and diuresing well. Symptoms much improved.  -Check 2D echo with diastolic dysfunction . Normal TSH -Will discharge on oral lasix 40 mg daily. Added potassium supplements. -follow with PCP in 1 week. counseled on salt restriction and monitoring her diet. Also encouraged in increased activity.   Active Problems:   nausea  Possibly viral gastroenteritis . Resolved and  tolerating diet.   DM (diabetes mellitus)  . Last A1C of 8.6 . Low fs while in the hospital. Will reduce lantus dose to 25 units daily. follow as outpt  HTN (hypertension)  continue home BP meds.   Recent left knee surgery  Resumed xarelto while int eh hospital . Appears to have been prescribed until 9/12 after recent sx. Will stop on discharge and switch  her back to home dose baby aspirin. . Continue pain meds . Reports poor mobility. PT evaluated pt and recommended HHPT. Follow up with ortho as outpt   Acute-on-chronic renal failure  Slowly improving . Follow lab as outpt  abdominal wall mass  reports to be chronic and has not changed in size. Non tender . Previous CT from 2010 shows ventral abdominal wall fluid collection.   Anemia  Possibly secondary to chr disease     Code Status: full  Family Communication:spoke with daughter on 9/14   Disposition Plan: home  Consultants:  None   Procedures:  VQ scan  2D echo   Antibiotics:  none   Discharge Exam: Filed Vitals:   02/14/14 1030  BP: 153/52  Pulse: 66  Temp: 98.3 F (36.8 C)  Resp:  18   General: NAD  HEENT: moist mucosa Cardiovascular: NS1&S2, no murmurs  Respiratory: clear b/l, , no rhonchi or wheeze  Abdomen: soft , firm palpable abd wall mass, non tender,  BS+ Ext warm, trace  pitting edema b/l   Discharge Instructions You were cared for by a hospitalist during your hospital stay. If you have any questions about your discharge medications or the care you received while you were in the hospital after you are discharged, you can call the unit and asked to speak with the hospitalist on call if the hospitalist that took care of you is not available. Once you are discharged, your primary care physician will handle any further medical issues. Please note that NO REFILLS for any discharge medications will be authorized once you are discharged, as it is imperative that you return to your primary care physician (or establish a relationship with a primary care physician if you do not have one) for your aftercare needs so that they can reassess your need for medications and monitor your lab values.   Current Discharge Medication List  Stop taking this  medications rivaroxaban (XARELTO) 10 MG TABS tablet        START taking these medications   Details  furosemide (LASIX) 40 MG tablet Take 1 tablet (40 mg total) by mouth daily. Qty: 30 tablet, Refills: 0    potassium chloride 20 MEQ TBCR Take 8 mEq by mouth daily. Qty: 30 tablet, Refills: 0    Aspirin 81 mg po 1 tablet daily               Qty 30 tablet, 0 refill    CONTINUE these medications which have CHANGED   Details  insulin glargine (LANTUS) 100 UNIT/ML injection Inject 0.25 mLs (25 Units total) into the skin every evening. Qty: 10 mL, Refills: 11      CONTINUE these medications which have NOT CHANGED   Details  acetaminophen (TYLENOL) 325 MG tablet Take 2 tablets (650 mg total) by mouth every 6 (six) hours as needed for mild pain (or Fever >/= 101). Qty: 40 tablet, Refills: 0    amLODipine (NORVASC) 10 MG  tablet Take 10 mg by mouth every morning.     carvedilol (COREG) 25 MG tablet Take 25 mg by mouth 2 (two) times daily with a meal.    cloNIDine (CATAPRES) 0.2 MG tablet Take 0.2 mg by mouth every morning.     latanoprost (XALATAN) 0.005 % ophthalmic solution Place 1 drop into both eyes at bedtime.    methocarbamol (ROBAXIN) 500 MG tablet Take 1 tablet (500 mg total) by mouth every 6 (six) hours as needed for muscle spasms. Qty: 80 tablet, Refills: 1    metoCLOPramide (REGLAN) 5 MG tablet Take 1-2 tablets (5-10 mg total) by mouth every 8 (eight) hours as needed for nausea (if ondansetron (ZOFRAN) ineffective.). Qty: 40 tablet, Refills: 0    ondansetron (ZOFRAN) 4  MG tablet Take 1 tablet (4 mg total) by mouth every 6 (six) hours as needed for nausea. Qty: 40 tablet, Refills: 0    oxyCODONE (OXY IR/ROXICODONE) 5 MG immediate release tablet Take 1-2 tablets (5-10 mg total) by mouth every 3 (three) hours as needed for moderate pain, severe pain or breakthrough pain. Qty: 80 tablet, Refills: 0         rosuvastatin (CRESTOR) 10 MG tablet Take 10 mg by mouth daily.    sitaGLIPtin (JANUVIA) 100 MG tablet Take 100 mg by mouth daily.    traMADol (ULTRAM) 50 MG tablet Take 1-2 tablets (50-100 mg total) by mouth every 6 (six) hours as needed (mild pain). Qty: 60 tablet, Refills: 1       Allergies  Allergen Reactions  . Peanut-Containing Drug Products Anaphylaxis    Patient allergic to all nuts.    Follow-up Information   Follow up with GREEN, EDWIN JAY, MD. Schedule an appointment as soon as possible for a visit in 1 week.   Specialty:  Internal Medicine   Contact information:   [address] [place] [address] [phone]        The results of significant diagnostics from this hospitalization (including imaging, microbiology, ancillary and laboratory) are listed below for reference.    Significant Diagnostic Studies: Dg Chest 2 View  02/11/2014   CLINICAL  DATA:  Hypoxia and shortness of breath.  EXAM: CHEST  2 VIEW  COMPARISON:  01/13/2014  FINDINGS: The heart is enlarged. Small bilateral pleural effusions are present. There is prominence of interstitial markings, best seen on the lateral view. Findings are consistent with interstitial pulmonary edema. There are no focal consolidations.  IMPRESSION: Cardiomegaly, mild pulmonary edema.   Electronically Signed   By: Rosalie Gums M.D.   On: 02/11/2014 10:49   Nm Pulmonary Perfusion  02/11/2014   CLINICAL DATA:  Hypoxia. Status post knee surgery on 01/20/2014. Nausea and vomiting.  EXAM: NUCLEAR MEDICINE VENTILATION - PERFUSION LUNG SCAN  TECHNIQUE: Wash-in, equilibrium, and wash-out phase ventilation images were obtained using Xe-133 gas. Perfusion images were obtained in multiple projections after intravenous injection of Tc-39m MAA.  RADIOPHARMACEUTICALS:  5.5 mCi Tc-40m MAA.  COMPARISON:  No priors.  FINDINGS: Per report from the technologist, the patient was unable to lay flat and breathe during the examination. She was unable to complete the perfusion portion of the scan, and unable to perform the ventilation portion of the examination. As such, only a limited perfusion scan was performed, including anterior, LAO, posterior an RPO views. With these limitations in mind, there are no perfusion defects noted.  IMPRESSION: 1. Exceedingly limited perfusion only scan demonstrating no perfusion defects. This is a low probability study.   Electronically Signed   By: Trudie Reed M.D.   On: 02/11/2014 14:09   US Renal  01/24/2014   CLINICAL DATA:  Increased BUN and creatinine. History of diabetes, hypertension, obesity, CKD 2  EXAM: RENAL/URINARY TRACT ULTRASOUND COMPLETE  COMPARISON:  CT abdomen and pelvis 02/03/2009  FINDINGS: Right Kidney:  Length: 11.5 cm. Limited visualization. Parenchymal thickness appears normal. No hydronephrosis.  Left Kidney:  Left kidney is not visualized sonographically. Previous CT  scan demonstrates diffuse calcification of an atrophic left kidney.  Bladder:  Decompressed with Foley catheter.  Not visualized.  IMPRESSION: Limited visualization. No evidence of hydronephrosis on the right kidney. Left kidney not visualized but was shown to be atrophic and calcified on previous CT.   Electronically Signed  By: Burman Nieves M.D.   On: 01/24/2014 00:02    Microbiology: No results found for this or any previous visit (from the past 240 hour(s)).   Labs: Basic Metabolic Panel:  Recent Labs Lab 02/11/14 0800 02/12/14 0516 02/13/14 0530 02/14/14 0438  NA 141 143 144 145  K 3.9 3.3* 3.5* 3.3*  CL 106 108 107 105  CO2 GLUCOSE 175* 50* 130* 91  BUN CREATININE 1.35* 1.84* 1.77* 1.62*  CALCIUM 8.9 8.4 8.1* 8.6   Liver Function Tests:  Recent Labs Lab 02/11/14 0800  AST 19  ALT 7  ALKPHOS 139*  BILITOT 0.6  PROT 6.4  ALBUMIN 2.4*    Recent Labs Lab 02/11/14 0800  LIPASE 47   No results found for this basename: AMMONIA,  in the last 168 hours CBC:  Recent Labs Lab 02/11/14 0800 02/12/14 0516 02/14/14 0438  WBC 7.6 6.7 6.7  NEUTROABS 5.9  --   --   HGB 9.9* 9.5* 9.8*  HCT 30.5* 30.6* 31.0*  MCV 81.8 83.2 81.8  PLT 226 232 227   Cardiac Enzymes: No results found for this basename: CKTOTAL, CKMB, CKMBINDEX, TROPONINI,  in the last 168 hours BNP: BNP (last 3 results)  Recent Labs  02/11/14 0800  PROBNP 1646.0*   CBG:  Recent Labs Lab 02/13/14 1650 02/13/14 2111 02/14/14 0737 02/14/14 0821 02/14/14 1152  GLUCAP 104* 149* 69* 94 153*       Signed:  Raphael Fitzpatrick  [facility] 02/14/2014, 1:11 PM

## 2014-02-14 NOTE — Discharge Instructions (Signed)

## 2014-02-14 NOTE — Progress Notes (Signed)
Physical Therapy Treatment Patient Details Name: Katherine [name] MRN: [id number] DOB: 1933/09/01 Today's Date: 02/14/2014    History of Present Illness Pt is a 80 year old female adm with hypoxia, recent TKA on 01/20/14. PMHx:  HTN, DM, CKD--one kidney    PT Comments    Pt OOB in recliner.  Assisted with amb and TKR TE's following handout.  Pt currently at supervision level. **daughter called pt on phone during session concerned about D/C today and the fact that pt lives alone with night time being her greatest concern.     Follow Up Recommendations  Home health PT;Supervision - Intermittent     Equipment Recommendations  None recommended by PT    Recommendations for Other Services       Precautions / Restrictions Precautions Precautions: Knee;Fall Restrictions Weight Bearing Restrictions: No Other Position/Activity Restrictions: WBAT    Mobility  Bed Mobility               General bed mobility comments: Pt OOB in recliner  Transfers Overall transfer level: Needs assistance Equipment used: Rolling walker (2 wheeled) Transfers: Sit to/from Stand Sit to Stand: Supervision         General transfer comment: increased time  Ambulation/Gait Ambulation/Gait assistance: Supervision Ambulation Distance (Feet): 85 Feet Assistive device: Rolling walker (2 wheeled)   Gait velocity: decreased   General Gait Details: functional distance and good safety cognition.     Stairs            Wheelchair Mobility    Modified Rankin (Stroke Patients Only)       Balance                                    Cognition                            Exercises   Total Knee Replacement TE's 10 reps B LE ankle pumps 10 reps towel squeezes 10 reps knee presses 10 reps heel slides  10 reps SAQ's 10 reps SLR's 10 reps ABD Followed by ICE     General Comments        Pertinent Vitals/Pain  no c/o pian Did c/o "poor appetite"      Home Living                      Prior Function            PT Goals (current goals can now be found in the care plan section) Progress towards PT goals: Progressing toward goals    Frequency  Min 3X/week    PT Plan      Co-evaluation             End of Session Equipment Utilized During Treatment: Gait belt Activity Tolerance: Patient tolerated treatment well Patient left: in chair;with call bell/phone within reach     Time: 1112-1140 PT Time Calculation (min): 28 min  Charges:  $Gait Training: 8-22 mins $Therapeutic Exercise: 8-22 mins                    G Codes:      Katherine [name]  PTA [facility]  Acute  Rehab Pager      [phone]

## 2014-02-14 NOTE — Progress Notes (Signed)
Hypoglycemic Event  CBG:69  Treatment: breakfast  Symptoms: asymptomatic  Follow-up CBG: Time:0820 CBG Result:94  Possible Reasons for Event: pt had not eaten breakfast this am  Comments/MD notified:MD made aware during rounds    BRANNAN, MELISSA G

## 2014-07-03 NOTE — Progress Notes (Signed)
This encounter was created in error - please disregard.

## 2014-12-08 NOTE — Patient Outreach (Signed)
[facility]) Care Management  12/08/2014  Katherine LauthClara P [name] [birth year] [id number]   Referral from High Risk List, Colleen CanLinda Manning, RN assigned to outreach.  Corrie MckusickLisa O. Sharlee BlewMoore, AABA [facility] CM Assistant Phone: [phone] Fax: [phone]

## 2015-01-11 ENCOUNTER — Other Ambulatory Visit: Payer: Self-pay | Admitting: Internal Medicine

## 2015-01-11 DIAGNOSIS — N289 Disorder of kidney and ureter, unspecified: Secondary | ICD-10-CM

## 2015-01-13 ENCOUNTER — Ambulatory Visit
Admission: RE | Admit: 2015-01-13 | Discharge: 2015-01-13 | Disposition: A | Discharge status: Home / Self Care | Payer: Medicare Other | Source: Ambulatory Visit | Attending: Internal Medicine | Admitting: Internal Medicine

## 2015-01-13 ENCOUNTER — Other Ambulatory Visit: Payer: Medicare Other

## 2015-01-13 DIAGNOSIS — N289 Disorder of kidney and ureter, unspecified: Secondary | ICD-10-CM

## 2015-02-20 ENCOUNTER — Other Ambulatory Visit: Payer: Self-pay | Admitting: Internal Medicine

## 2015-05-05 ENCOUNTER — Other Ambulatory Visit: Payer: Self-pay | Admitting: *Deleted

## 2015-05-05 NOTE — Patient Outreach (Signed)
[facility]N) Care Management  05/05/2015  Katherine LauthClara P [name] 1933-08-29 [id number]  Referral from High Risk List: Telephone call to patient; advised of reason for call & [facility]. HIPPA verification received from patient. Patient only able to speak briefly.  Requested return call.  Plan: will follow up. Patient agrees with set appointment to complete screening assessment. Colleen CanLinda Layn Kye, RN BSN CCM Care Management Coordinator [facility]  [phone]

## 2015-05-08 ENCOUNTER — Ambulatory Visit: Payer: Self-pay | Admitting: *Deleted

## 2015-05-09 ENCOUNTER — Other Ambulatory Visit: Payer: Self-pay | Admitting: *Deleted

## 2015-05-09 NOTE — Patient Outreach (Signed)
[facility]) Care Management  05/09/2015  Katherine LauthClara P [name] 05-Jan-1934 [id number]  Telephone call to patient; left messages on home and cell phone voice mail requesting call back.  Plan: will follow up. Colleen CanLinda Andres Vest, RN BSN CCM Care Management Coordinator [facility]  [phone]

## 2015-05-10 ENCOUNTER — Other Ambulatory Visit: Payer: Self-pay | Admitting: *Deleted

## 2015-05-10 NOTE — Patient Outreach (Signed)
[facility]) Care Management  05/10/2015  Katherine LauthClara P [name] Jan 16, 1934 [id number]  Telephone call to patient; left messages on home & cell phone requesting call back.  Plan: Will follow up.  Colleen CanLinda Myrick Mcnairy, RN BSN CCM Care Management Coordinator [facility] Care Management  [phone]

## 2015-05-11 ENCOUNTER — Encounter: Payer: Self-pay | Admitting: *Deleted

## 2015-05-11 ENCOUNTER — Other Ambulatory Visit: Payer: Self-pay | Admitting: *Deleted

## 2015-05-11 NOTE — Patient Outreach (Signed)
[facility]N) Care Management  05/11/2015  Myrtice LauthClara P Pamer 08/07/1933 [id number]  Unsuccessful recall attempts after 1st call when patient requested call back.  Plan: will send outreach letter and follow up in 10 business days.  Colleen CanLinda Claudina Oliphant, RN BSN CCM Care Management Coordinator [facility] Care [facility]  [phone]

## 2015-05-30 ENCOUNTER — Encounter: Payer: Self-pay | Admitting: *Deleted

## 2015-05-30 ENCOUNTER — Other Ambulatory Visit: Payer: Self-pay | Admitting: *Deleted

## 2015-05-30 NOTE — Patient Outreach (Signed)
[facility]) Care Management  05/30/2015  Katherine [name] [birth year] [id number]   No response to several call attempts and outreach letter.  Plan: Sent MD closure letter. Sent to care management assistant for case closure.  Colleen CanLinda Gionni Vaca, RN BSN CCM Care Management Coordinator [facility] Management  [phone]

## 2015-06-22 ENCOUNTER — Other Ambulatory Visit: Payer: Self-pay

## 2015-06-22 DIAGNOSIS — Z1231 Encounter for screening mammogram for malignant neoplasm of breast: Secondary | ICD-10-CM

## 2015-07-25 ENCOUNTER — Ambulatory Visit: Payer: Medicare Other

## 2015-08-21 ENCOUNTER — Encounter (HOSPITAL_COMMUNITY): Payer: Self-pay | Admitting: Oncology

## 2015-08-21 ENCOUNTER — Emergency Department (HOSPITAL_COMMUNITY): Payer: Medicare Other

## 2015-08-21 ENCOUNTER — Emergency Department (HOSPITAL_COMMUNITY)
Admission: EM | Admit: 2015-08-21 | Discharge: 2015-08-21 | Disposition: A | Discharge status: Home / Self Care | Payer: Medicare Other | Attending: Emergency Medicine | Admitting: Emergency Medicine

## 2015-08-21 DIAGNOSIS — M170 Bilateral primary osteoarthritis of knee: Secondary | ICD-10-CM | POA: Diagnosis not present

## 2015-08-21 DIAGNOSIS — Z79899 Other long term (current) drug therapy: Secondary | ICD-10-CM | POA: Diagnosis not present

## 2015-08-21 DIAGNOSIS — Z7984 Long term (current) use of oral hypoglycemic drugs: Secondary | ICD-10-CM | POA: Insufficient documentation

## 2015-08-21 DIAGNOSIS — R4182 Altered mental status, unspecified: Secondary | ICD-10-CM | POA: Insufficient documentation

## 2015-08-21 DIAGNOSIS — Z87448 Personal history of other diseases of urinary system: Secondary | ICD-10-CM | POA: Diagnosis not present

## 2015-08-21 DIAGNOSIS — I10 Essential (primary) hypertension: Secondary | ICD-10-CM | POA: Diagnosis not present

## 2015-08-21 DIAGNOSIS — Z794 Long term (current) use of insulin: Secondary | ICD-10-CM | POA: Insufficient documentation

## 2015-08-21 DIAGNOSIS — R680 Hypothermia, not associated with low environmental temperature: Secondary | ICD-10-CM | POA: Diagnosis not present

## 2015-08-21 DIAGNOSIS — T68XXXA Hypothermia, initial encounter: Secondary | ICD-10-CM

## 2015-08-21 DIAGNOSIS — Z862 Personal history of diseases of the blood and blood-forming organs and certain disorders involving the immune mechanism: Secondary | ICD-10-CM | POA: Diagnosis not present

## 2015-08-21 DIAGNOSIS — Z7982 Long term (current) use of aspirin: Secondary | ICD-10-CM | POA: Diagnosis not present

## 2015-08-21 DIAGNOSIS — R410 Disorientation, unspecified: Secondary | ICD-10-CM | POA: Diagnosis present

## 2015-08-21 DIAGNOSIS — Z8669 Personal history of other diseases of the nervous system and sense organs: Secondary | ICD-10-CM | POA: Insufficient documentation

## 2015-08-21 DIAGNOSIS — E7800 Pure hypercholesterolemia, unspecified: Secondary | ICD-10-CM | POA: Insufficient documentation

## 2015-08-21 DIAGNOSIS — E119 Type 2 diabetes mellitus without complications: Secondary | ICD-10-CM | POA: Diagnosis not present

## 2015-08-21 DIAGNOSIS — Z87891 Personal history of nicotine dependence: Secondary | ICD-10-CM | POA: Insufficient documentation

## 2015-08-21 LAB — CBC WITH DIFFERENTIAL/PLATELET
Basophils Absolute: 0 10*3/uL (ref 0.0–0.1)
Basophils Relative: 0 %
EOS ABS: 0 10*3/uL (ref 0.0–0.7)
Eosinophils Relative: 0 %
HCT: 34.5 % — ABNORMAL LOW (ref 36.0–46.0)
HEMOGLOBIN: 11.4 g/dL — AB (ref 12.0–15.0)
LYMPHS ABS: 1 10*3/uL (ref 0.7–4.0)
LYMPHS PCT: 10 %
MCH: 26.1 pg (ref 26.0–34.0)
MCHC: 33 g/dL (ref 30.0–36.0)
MCV: 79.1 fL (ref 78.0–100.0)
MONOS PCT: 3 %
Monocytes Absolute: 0.4 10*3/uL (ref 0.1–1.0)
NEUTROS PCT: 87 %
Neutro Abs: 9.6 10*3/uL — ABNORMAL HIGH (ref 1.7–7.7)
Platelets: 224 10*3/uL (ref 150–400)
RBC: 4.36 MIL/uL (ref 3.87–5.11)
RDW: 15.1 % (ref 11.5–15.5)
WBC: 11 10*3/uL — ABNORMAL HIGH (ref 4.0–10.5)

## 2015-08-21 LAB — I-STAT CG4 LACTIC ACID, ED
Lactic Acid, Venous: 0.89 mmol/L (ref 0.5–2.0)
Lactic Acid, Venous: 0.51 mmol/L (ref 0.5–2.0)

## 2015-08-21 LAB — URINALYSIS, ROUTINE W REFLEX MICROSCOPIC
BILIRUBIN URINE: NEGATIVE
Glucose, UA: NEGATIVE mg/dL
HGB URINE DIPSTICK: NEGATIVE
Ketones, ur: NEGATIVE mg/dL
Leukocytes, UA: NEGATIVE
NITRITE: NEGATIVE
PH: 6.5 (ref 5.0–8.0)
Protein, ur: 100 mg/dL — AB
SPECIFIC GRAVITY, URINE: 1.014 (ref 1.005–1.030)

## 2015-08-21 LAB — URINE MICROSCOPIC-ADD ON: RBC / HPF: NONE SEEN RBC/hpf (ref 0–5)

## 2015-08-21 LAB — COMPREHENSIVE METABOLIC PANEL
ALK PHOS: 103 U/L (ref 38–126)
ALT: 19 U/L (ref 14–54)
ANION GAP: 11 (ref 5–15)
AST: 26 U/L (ref 15–41)
Albumin: 3.6 g/dL (ref 3.5–5.0)
BILIRUBIN TOTAL: 0.9 mg/dL (ref 0.3–1.2)
BUN: 87 mg/dL — ABNORMAL HIGH (ref 6–20)
CALCIUM: 9.5 mg/dL (ref 8.9–10.3)
CO2: 26 mmol/L (ref 22–32)
CREATININE: 3.03 mg/dL — AB (ref 0.44–1.00)
Chloride: 108 mmol/L (ref 101–111)
GFR, EST AFRICAN AMERICAN: 16 mL/min — AB (ref >60)
GFR, EST NON AFRICAN AMERICAN: 14 mL/min — AB (ref >60)
Glucose, Bld: 83 mg/dL (ref 65–99)
Potassium: 3.8 mmol/L (ref 3.5–5.1)
SODIUM: 145 mmol/L (ref 135–145)
TOTAL PROTEIN: 7.7 g/dL (ref 6.5–8.1)

## 2015-08-21 LAB — PROTIME-INR
INR: 1.03 (ref 0.00–1.49)
PROTHROMBIN TIME: 13.7 s (ref 11.6–15.2)

## 2015-08-21 LAB — LIPASE, BLOOD: LIPASE: 69 U/L — AB (ref 11–51)

## 2015-08-21 MED ORDER — SODIUM CHLORIDE 0.9 % IV BOLUS (SEPSIS): 1000.0000 mL | INTRAVENOUS | Status: DC

## 2015-08-21 MED ORDER — SODIUM CHLORIDE 0.9 % IV BOLUS (SEPSIS)
1000.0000 mL | Freq: Once | INTRAVENOUS | Status: AC
  Administered 2015-08-21: 1000 mL via INTRAVENOUS

## 2015-08-21 NOTE — Discharge Instructions (Signed)
Hypothermia Hypothermia is a drop in body temperature to 76F (35C) or lower. Hypothermia is life threatening because the body's organs cannot work properly when the body's temperature is low. CAUSES This condition is usually caused by exposure to cold temperatures. It can develop if a person:  Goes into cold weather or stays in a cool room without being dressed warmly enough.  Gets wet, such as after being caught in the rain or falling into cool water.  Has a disease or condition that alters the body's temperature, such as hypothyroidism or Parkinson disease.  Takes a medicine that affects the body's ability to control its temperature. RISK FACTORS This condition is more likely to develop in:  People who are homeless.  People who have difficulty moving around (impaired mobility).  Babies.  Elderly adults.  People who have a medical condition that affects the body's ability to control its temperature.  People who take medicine that affects the body's ability to control its temperature.  People who drink alcohol excessively. SYMPTOMS Symptoms of this condition include:  Drowsiness.  Confusion.  Slowed thinking, speech, and response time.  Slow, shallow breathing.  Slow, weak pulse.  Uncoordinated movements.  Numbness.  Shivering. Shivering happens early in hypothermia and may stop late in hypothermia. DIAGNOSIS  This condition is diagnosed by taking the person's temperature. TREATMENT The first stage of treatment is applying first aid. Depending on the situation, this may involve:  Moving the person to a warmer location.  Protecting the person from the wind.  Laying the person on a blanket or other surface so that he or she is not in contact with the cold ground.  Taking off any wet clothing and wrapping the person in something dry, such as a blanket or coat.  Lying down close to the person and making skin-to-skin contact to warm the person up.  Giving the  person a warm, caffeine-free drink.  Covering the person with a blanket. The person will be handled gently. Vigorous handling can cause the heart to go into an abnormal and possibly fatal heart rhythm. Treatment should not involve applying heat directly to the skin, such as with hot water, a heating lamp, or a heat pack. These things can injure the skin and cause a heart attack. The second stage of treatment is done at medical facility. It may involve:  Giving the person warm fluids through an IV tube.  Giving the person warm, humidified oxygen through nasal tubing (nasal cannula), an oxygen mask, or a breathing tube.  Rewarming the blood by removing it, passing it through a hemodialysis machine, and returning it to the body at gradually increasing temperatures.  Giving a warm solution through a tiny tube that goes into the stomach, bladder, or intestine (cavity lavage).  Giving medicine to stabilize heart rhythm. HOME CARE INSTRUCTIONS   If you have an increased risk of developing hypothermia, keep your home warm.  Stay alert for dangerous weather situations and plan accordingly.  Get out of cold water right away, and replace wet clothing with dry clothing as soon as possible.  Do not ignore your body's signals that you are getting cold.  If you know you will be in a cold environment:  Wear layers of clothing.  Do not drink alcohol. Alcohol causes your blood vessels to get larger (dilate) which makes you lose body heat. SEEK IMMEDIATE MEDICAL CARE IF:  You develop hypothermia again.   This information is not intended to replace advice given to you by your  health care provider. Make sure you discuss any questions you have with your health care provider.   Document Released: 11/07/2009 Document Revised: 02/08/2015 Document Reviewed: 08/15/2014 Elsevier Interactive Patient Education [employer]2016 Elsevier Inc.

## 2015-08-21 NOTE — ED Notes (Signed)
Discharge instructions and follow up care reviewed with patient. Patient verbalized understanding. 

## 2015-08-21 NOTE — ED Notes (Signed)
MD at bedside. 

## 2015-08-21 NOTE — ED Notes (Signed)
Per EMS pt woke up this am feeling weak and mildly confused so she pushed her life alert.  Upon arrival pt was A&O x 4.  Pt is incontinent of urine unknown if this is new, will address w/ pt and family.

## 2015-08-21 NOTE — ED Notes (Signed)
Upon assessment, patient was placed on bear hugger due to patient's temperature 93.7 rectally. Patient's oral temperature now is 97.4.  Per Dr. Patria Maneampos, patient can be taken off of bear hugger.
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
## 2015-08-22 LAB — URINE CULTURE: Culture: NO GROWTH

## 2015-08-26 LAB — CULTURE, BLOOD (ROUTINE X 2)
Culture: NO GROWTH
Culture: NO GROWTH

## 2015-09-25 ENCOUNTER — Inpatient Hospital Stay (HOSPITAL_COMMUNITY)
Admission: EM | Admit: 2015-09-25 | Discharge: 2015-10-14 | DRG: 252 | Disposition: A | Discharge status: Skilled Nursing Facility | Payer: Medicare Other | Attending: Internal Medicine | Admitting: Internal Medicine

## 2015-09-25 ENCOUNTER — Inpatient Hospital Stay (HOSPITAL_COMMUNITY): Payer: Medicare Other

## 2015-09-25 ENCOUNTER — Encounter (HOSPITAL_COMMUNITY): Payer: Self-pay | Admitting: *Deleted

## 2015-09-25 ENCOUNTER — Emergency Department (HOSPITAL_COMMUNITY): Payer: Medicare Other

## 2015-09-25 DIAGNOSIS — E1122 Type 2 diabetes mellitus with diabetic chronic kidney disease: Secondary | ICD-10-CM | POA: Diagnosis not present

## 2015-09-25 DIAGNOSIS — E7800 Pure hypercholesterolemia, unspecified: Secondary | ICD-10-CM | POA: Diagnosis present

## 2015-09-25 DIAGNOSIS — R609 Edema, unspecified: Secondary | ICD-10-CM

## 2015-09-25 DIAGNOSIS — R188 Other ascites: Secondary | ICD-10-CM | POA: Diagnosis present

## 2015-09-25 DIAGNOSIS — I509 Heart failure, unspecified: Secondary | ICD-10-CM

## 2015-09-25 DIAGNOSIS — J810 Acute pulmonary edema: Secondary | ICD-10-CM

## 2015-09-25 DIAGNOSIS — I951 Orthostatic hypotension: Secondary | ICD-10-CM | POA: Diagnosis not present

## 2015-09-25 DIAGNOSIS — Z96653 Presence of artificial knee joint, bilateral: Secondary | ICD-10-CM | POA: Diagnosis present

## 2015-09-25 DIAGNOSIS — Z992 Dependence on renal dialysis: Secondary | ICD-10-CM

## 2015-09-25 DIAGNOSIS — I10 Essential (primary) hypertension: Secondary | ICD-10-CM | POA: Diagnosis not present

## 2015-09-25 DIAGNOSIS — Z79899 Other long term (current) drug therapy: Secondary | ICD-10-CM

## 2015-09-25 DIAGNOSIS — Z794 Long term (current) use of insulin: Secondary | ICD-10-CM | POA: Diagnosis not present

## 2015-09-25 DIAGNOSIS — R110 Nausea: Secondary | ICD-10-CM | POA: Diagnosis not present

## 2015-09-25 DIAGNOSIS — E1165 Type 2 diabetes mellitus with hyperglycemia: Secondary | ICD-10-CM

## 2015-09-25 DIAGNOSIS — E1129 Type 2 diabetes mellitus with other diabetic kidney complication: Secondary | ICD-10-CM

## 2015-09-25 DIAGNOSIS — I151 Hypertension secondary to other renal disorders: Secondary | ICD-10-CM | POA: Diagnosis not present

## 2015-09-25 DIAGNOSIS — N179 Acute kidney failure, unspecified: Secondary | ICD-10-CM | POA: Diagnosis present

## 2015-09-25 DIAGNOSIS — M1990 Unspecified osteoarthritis, unspecified site: Secondary | ICD-10-CM | POA: Diagnosis present

## 2015-09-25 DIAGNOSIS — L0291 Cutaneous abscess, unspecified: Secondary | ICD-10-CM

## 2015-09-25 DIAGNOSIS — Z6841 Body Mass Index (BMI) 40.0 and over, adult: Secondary | ICD-10-CM

## 2015-09-25 DIAGNOSIS — I5031 Acute diastolic (congestive) heart failure: Secondary | ICD-10-CM | POA: Diagnosis present

## 2015-09-25 DIAGNOSIS — Z7982 Long term (current) use of aspirin: Secondary | ICD-10-CM

## 2015-09-25 DIAGNOSIS — J811 Chronic pulmonary edema: Secondary | ICD-10-CM | POA: Insufficient documentation

## 2015-09-25 DIAGNOSIS — Z515 Encounter for palliative care: Secondary | ICD-10-CM | POA: Insufficient documentation

## 2015-09-25 DIAGNOSIS — D631 Anemia in chronic kidney disease: Secondary | ICD-10-CM | POA: Diagnosis present

## 2015-09-25 DIAGNOSIS — E785 Hyperlipidemia, unspecified: Secondary | ICD-10-CM | POA: Diagnosis not present

## 2015-09-25 DIAGNOSIS — E118 Type 2 diabetes mellitus with unspecified complications: Secondary | ICD-10-CM

## 2015-09-25 DIAGNOSIS — N2889 Other specified disorders of kidney and ureter: Secondary | ICD-10-CM

## 2015-09-25 DIAGNOSIS — Z87891 Personal history of nicotine dependence: Secondary | ICD-10-CM | POA: Diagnosis not present

## 2015-09-25 DIAGNOSIS — D573 Sickle-cell trait: Secondary | ICD-10-CM | POA: Diagnosis present

## 2015-09-25 DIAGNOSIS — N390 Urinary tract infection, site not specified: Secondary | ICD-10-CM | POA: Diagnosis present

## 2015-09-25 DIAGNOSIS — I132 Hypertensive heart and chronic kidney disease with heart failure and with stage 5 chronic kidney disease, or end stage renal disease: Secondary | ICD-10-CM | POA: Diagnosis present

## 2015-09-25 DIAGNOSIS — E8770 Fluid overload, unspecified: Secondary | ICD-10-CM

## 2015-09-25 DIAGNOSIS — N182 Chronic kidney disease, stage 2 (mild): Secondary | ICD-10-CM | POA: Diagnosis present

## 2015-09-25 DIAGNOSIS — N183 Chronic kidney disease, stage 3 (moderate): Secondary | ICD-10-CM | POA: Diagnosis not present

## 2015-09-25 DIAGNOSIS — J9601 Acute respiratory failure with hypoxia: Secondary | ICD-10-CM | POA: Diagnosis present

## 2015-09-25 DIAGNOSIS — Z7984 Long term (current) use of oral hypoglycemic drugs: Secondary | ICD-10-CM | POA: Diagnosis not present

## 2015-09-25 DIAGNOSIS — N184 Chronic kidney disease, stage 4 (severe): Secondary | ICD-10-CM | POA: Diagnosis not present

## 2015-09-25 DIAGNOSIS — I5033 Acute on chronic diastolic (congestive) heart failure: Secondary | ICD-10-CM | POA: Diagnosis present

## 2015-09-25 DIAGNOSIS — N186 End stage renal disease: Secondary | ICD-10-CM | POA: Diagnosis present

## 2015-09-25 DIAGNOSIS — Z7189 Other specified counseling: Secondary | ICD-10-CM | POA: Insufficient documentation

## 2015-09-25 DIAGNOSIS — R0602 Shortness of breath: Secondary | ICD-10-CM | POA: Diagnosis present

## 2015-09-25 DIAGNOSIS — N185 Chronic kidney disease, stage 5: Secondary | ICD-10-CM | POA: Diagnosis not present

## 2015-09-25 DIAGNOSIS — Z419 Encounter for procedure for purposes other than remedying health state, unspecified: Secondary | ICD-10-CM

## 2015-09-25 DIAGNOSIS — Z9889 Other specified postprocedural states: Secondary | ICD-10-CM

## 2015-09-25 DIAGNOSIS — F329 Major depressive disorder, single episode, unspecified: Secondary | ICD-10-CM | POA: Diagnosis present

## 2015-09-25 DIAGNOSIS — Z09 Encounter for follow-up examination after completed treatment for conditions other than malignant neoplasm: Secondary | ICD-10-CM

## 2015-09-25 DIAGNOSIS — IMO0002 Reserved for concepts with insufficient information to code with codable children: Secondary | ICD-10-CM

## 2015-09-25 LAB — BRAIN NATRIURETIC PEPTIDE: B NATRIURETIC PEPTIDE 5: 381.4 pg/mL — AB (ref 0.0–100.0)

## 2015-09-25 LAB — COMPREHENSIVE METABOLIC PANEL
ALBUMIN: 3.3 g/dL — AB (ref 3.5–5.0)
ALT: 17 U/L (ref 14–54)
AST: 21 U/L (ref 15–41)
Alkaline Phosphatase: 121 U/L (ref 38–126)
Anion gap: 10 (ref 5–15)
BUN: 37 mg/dL — ABNORMAL HIGH (ref 6–20)
CHLORIDE: 110 mmol/L (ref 101–111)
CO2: 23 mmol/L (ref 22–32)
CREATININE: 1.95 mg/dL — AB (ref 0.44–1.00)
Calcium: 8.9 mg/dL (ref 8.9–10.3)
GFR calc non Af Amer: 23 mL/min — ABNORMAL LOW (ref >60)
GFR, EST AFRICAN AMERICAN: 27 mL/min — AB (ref >60)
GLUCOSE: 216 mg/dL — AB (ref 65–99)
Potassium: 3.7 mmol/L (ref 3.5–5.1)
SODIUM: 143 mmol/L (ref 135–145)
Total Bilirubin: 0.7 mg/dL (ref 0.3–1.2)
Total Protein: 7.3 g/dL (ref 6.5–8.1)

## 2015-09-25 LAB — CBC WITH DIFFERENTIAL/PLATELET
BASOS ABS: 0 10*3/uL (ref 0.0–0.1)
Basophils Relative: 0 %
Eosinophils Absolute: 0.1 10*3/uL (ref 0.0–0.7)
Eosinophils Relative: 1 %
HEMATOCRIT: 31.1 % — AB (ref 36.0–46.0)
Hemoglobin: 10.2 g/dL — ABNORMAL LOW (ref 12.0–15.0)
LYMPHS ABS: 0.8 10*3/uL (ref 0.7–4.0)
LYMPHS PCT: 8 %
MCH: 25.2 pg — ABNORMAL LOW (ref 26.0–34.0)
MCHC: 32.8 g/dL (ref 30.0–36.0)
MCV: 76.8 fL — AB (ref 78.0–100.0)
MONO ABS: 1 10*3/uL (ref 0.1–1.0)
Monocytes Relative: 9 %
NEUTROS ABS: 9.3 10*3/uL — AB (ref 1.7–7.7)
Neutrophils Relative %: 82 %
Platelets: 212 10*3/uL (ref 150–400)
RBC: 4.05 MIL/uL (ref 3.87–5.11)
RDW: 17.5 % — ABNORMAL HIGH (ref 11.5–15.5)
WBC: 11.2 10*3/uL — AB (ref 4.0–10.5)

## 2015-09-25 LAB — GLUCOSE, CAPILLARY
GLUCOSE-CAPILLARY: 169 mg/dL — AB (ref 65–99)
GLUCOSE-CAPILLARY: 187 mg/dL — AB (ref 65–99)

## 2015-09-25 LAB — ECHOCARDIOGRAM COMPLETE
HEIGHTINCHES: 64 in
Weight: 4606.73 oz

## 2015-09-25 LAB — TROPONIN I: Troponin I: <0.03 ng/mL (ref <0.031)

## 2015-09-25 MED ORDER — ONDANSETRON HCL 4 MG/2ML IJ SOLN
4.0000 mg | Freq: Four times a day (QID) | INTRAMUSCULAR | Status: DC | PRN
  Administered 2015-09-30 – 2015-10-08 (×19): 4 mg via INTRAVENOUS
  Filled 2015-09-25 (×18): qty 2

## 2015-09-25 MED ORDER — LOSARTAN POTASSIUM-HCTZ 100-25 MG PO TABS: 1.0000 | ORAL_TABLET | Freq: Every day | ORAL | Status: DC

## 2015-09-25 MED ORDER — INSULIN ASPART 100 UNIT/ML ~~LOC~~ SOLN
0.0000 [IU] | SUBCUTANEOUS | Status: DC
  Administered 2015-09-25 (×2): 2 [IU] via SUBCUTANEOUS
  Administered 2015-09-26 (×2): 3 [IU] via SUBCUTANEOUS
  Administered 2015-09-26 – 2015-09-27 (×4): 2 [IU] via SUBCUTANEOUS

## 2015-09-25 MED ORDER — ASPIRIN EC 81 MG PO TBEC
81.0000 mg | DELAYED_RELEASE_TABLET | Freq: Every day | ORAL | Status: DC
  Administered 2015-09-25 – 2015-10-14 (×19): 81 mg via ORAL
  Filled 2015-09-25 (×20): qty 1

## 2015-09-25 MED ORDER — FUROSEMIDE 10 MG/ML IJ SOLN
80.0000 mg | Freq: Two times a day (BID) | INTRAMUSCULAR | Status: DC
  Administered 2015-09-25 – 2015-09-26 (×3): 80 mg via INTRAVENOUS
  Filled 2015-09-25 (×5): qty 8

## 2015-09-25 MED ORDER — SODIUM CHLORIDE 0.9 % IV SOLN: 250.0000 mL | INTRAVENOUS | Status: DC | PRN

## 2015-09-25 MED ORDER — ROSUVASTATIN CALCIUM 10 MG PO TABS
10.0000 mg | ORAL_TABLET | Freq: Every day | ORAL | Status: DC
  Administered 2015-09-25 – 2015-10-13 (×19): 10 mg via ORAL
  Filled 2015-09-25 (×21): qty 1

## 2015-09-25 MED ORDER — SODIUM CHLORIDE 0.9% FLUSH: 3.0000 mL | INTRAVENOUS | Status: DC | PRN

## 2015-09-25 MED ORDER — SODIUM CHLORIDE 0.9% FLUSH
3.0000 mL | Freq: Two times a day (BID) | INTRAVENOUS | Status: DC
  Administered 2015-09-25 – 2015-09-29 (×7): 3 mL via INTRAVENOUS

## 2015-09-25 MED ORDER — FUROSEMIDE 10 MG/ML IJ SOLN
80.0000 mg | Freq: Once | INTRAMUSCULAR | Status: AC
  Administered 2015-09-25: 80 mg via INTRAVENOUS
  Filled 2015-09-25: qty 8

## 2015-09-25 MED ORDER — CLONIDINE HCL 0.2 MG PO TABS
0.2000 mg | ORAL_TABLET | Freq: Every morning | ORAL | Status: DC
  Administered 2015-09-26 – 2015-09-29 (×4): 0.2 mg via ORAL
  Filled 2015-09-25 (×4): qty 1

## 2015-09-25 MED ORDER — INSULIN GLARGINE 100 UNIT/ML ~~LOC~~ SOLN
25.0000 [IU] | Freq: Every evening | SUBCUTANEOUS | Status: DC
  Administered 2015-09-25 – 2015-09-26 (×2): 25 [IU] via SUBCUTANEOUS
  Filled 2015-09-25 (×2): qty 0.25

## 2015-09-25 MED ORDER — ACETAMINOPHEN 325 MG PO TABS
650.0000 mg | ORAL_TABLET | ORAL | Status: DC | PRN
Start: 2015-09-25 — End: 2015-10-14
  Administered 2015-09-25 – 2015-10-07 (×9): 650 mg via ORAL
  Filled 2015-09-25 (×9): qty 2

## 2015-09-25 MED ORDER — CARVEDILOL 25 MG PO TABS
25.0000 mg | ORAL_TABLET | Freq: Two times a day (BID) | ORAL | Status: DC
  Administered 2015-09-25 – 2015-10-01 (×13): 25 mg via ORAL
  Filled 2015-09-25 (×15): qty 1

## 2015-09-25 MED ORDER — LOSARTAN POTASSIUM 50 MG PO TABS
100.0000 mg | ORAL_TABLET | Freq: Every day | ORAL | Status: DC
  Administered 2015-09-25: 100 mg via ORAL
  Filled 2015-09-25 (×2): qty 2

## 2015-09-25 MED ORDER — HEPARIN SODIUM (PORCINE) 5000 UNIT/ML IJ SOLN
5000.0000 [IU] | Freq: Three times a day (TID) | INTRAMUSCULAR | Status: DC
  Administered 2015-09-25 – 2015-10-01 (×14): 5000 [IU] via SUBCUTANEOUS
  Filled 2015-09-25 (×16): qty 1

## 2015-09-25 MED ORDER — AMLODIPINE BESYLATE 10 MG PO TABS
10.0000 mg | ORAL_TABLET | Freq: Every morning | ORAL | Status: DC
  Administered 2015-09-26 – 2015-09-27 (×2): 10 mg via ORAL
  Filled 2015-09-25 (×2): qty 1

## 2015-09-25 MED ORDER — ACETAMINOPHEN 325 MG PO TABS: 650.0000 mg | ORAL_TABLET | Freq: Four times a day (QID) | ORAL | Status: DC | PRN

## 2015-09-25 MED ORDER — HYDROCHLOROTHIAZIDE 25 MG PO TABS
25.0000 mg | ORAL_TABLET | Freq: Every day | ORAL | Status: DC
  Administered 2015-09-25: 25 mg via ORAL
  Filled 2015-09-25 (×2): qty 1
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
## 2015-09-25 NOTE — ED Notes (Signed)
Bed: WA08 Expected date:  Expected time:  Means of arrival:  Comments: EMS/82yo/fall

## 2015-09-25 NOTE — ED Notes (Addendum)
Per EMS - patient comes from home where she lives alone.  Patient fell this morning and called for help to get up.  Patient also c/o SOB with EMS on scene.  Patient has an appt with her PCP today to be assessed for increasing SOB.  Patient was placed on O2 Abanda and sats maintained mid-upper 90's.  Patient denies cough and fever.  Patient has hx of DM and HTN.  Patient was recently started on Lasix by PCP for extremity swelling.  Patient's vitals on scene were 220/99, HR 100, 98% on O2, 90% on RA.  Pt has 20 gauge to left hand.  Patient's 12 lead unremarkable.

## 2015-09-25 NOTE — ED Notes (Signed)
Pt can go to floor at 14:30 

## 2015-09-25 NOTE — Progress Notes (Signed)
Received pt from ED, VS obtained, telemetry applied, oriented to unit, call light with in reach

## 2015-09-25 NOTE — Progress Notes (Signed)
  [name] has been performed.  Cathie BeamsGREGORY, Shaan Rhoads 09/25/2015, 3:51 PM

## 2015-09-25 NOTE — ED Notes (Addendum)
Dr. Mikhail at bedside.

## 2015-09-26 DIAGNOSIS — E1122 Type 2 diabetes mellitus with diabetic chronic kidney disease: Secondary | ICD-10-CM

## 2015-09-26 DIAGNOSIS — N184 Chronic kidney disease, stage 4 (severe): Secondary | ICD-10-CM

## 2015-09-26 DIAGNOSIS — I5033 Acute on chronic diastolic (congestive) heart failure: Secondary | ICD-10-CM

## 2015-09-26 DIAGNOSIS — I10 Essential (primary) hypertension: Secondary | ICD-10-CM

## 2015-09-26 LAB — GLUCOSE, CAPILLARY
GLUCOSE-CAPILLARY: 223 mg/dL — AB (ref 65–99)
Glucose-Capillary: 156 mg/dL — ABNORMAL HIGH (ref 65–99)
Glucose-Capillary: 160 mg/dL — ABNORMAL HIGH (ref 65–99)
Glucose-Capillary: 172 mg/dL — ABNORMAL HIGH (ref 65–99)
Glucose-Capillary: 218 mg/dL — ABNORMAL HIGH (ref 65–99)
Glucose-Capillary: 79 mg/dL (ref 65–99)

## 2015-09-26 LAB — BASIC METABOLIC PANEL
Anion gap: 10 (ref 5–15)
BUN: 36 mg/dL — AB (ref 6–20)
CHLORIDE: 113 mmol/L — AB (ref 101–111)
CO2: 26 mmol/L (ref 22–32)
CREATININE: 2.01 mg/dL — AB (ref 0.44–1.00)
Calcium: 9 mg/dL (ref 8.9–10.3)
GFR calc Af Amer: 26 mL/min — ABNORMAL LOW (ref >60)
GFR calc non Af Amer: 22 mL/min — ABNORMAL LOW (ref >60)
GLUCOSE: 106 mg/dL — AB (ref 65–99)
Potassium: 3.5 mmol/L (ref 3.5–5.1)
SODIUM: 149 mmol/L — AB (ref 135–145)

## 2015-09-26 MED ORDER — DOCUSATE SODIUM 100 MG PO CAPS
200.0000 mg | ORAL_CAPSULE | Freq: Every day | ORAL | Status: DC
  Administered 2015-09-26 – 2015-10-13 (×17): 200 mg via ORAL
  Filled 2015-09-26 (×19): qty 2

## 2015-09-26 MED ORDER — ISOSORB DINITRATE-HYDRALAZINE 20-37.5 MG PO TABS
1.0000 | ORAL_TABLET | Freq: Three times a day (TID) | ORAL | Status: DC
  Administered 2015-09-26 – 2015-10-01 (×18): 1 via ORAL
  Filled 2015-09-26 (×19): qty 1

## 2015-09-26 NOTE — Care Management Note (Signed)
Case Management Note  Patient Details  Name: Katherine [name] MRN: [id number] Date of Birth: 25-Aug-1933  Subjective/Objective:  82 y/o f admitted w/Pulmonary Edema. From home.PT cons-await recc.                  Action/Plan:d/c plan home.   Expected Discharge Date:                  Expected Discharge Plan:  Home w Home Health Services  In-House Referral:     Discharge planning Services  CM Consult  Post Acute Care Choice:    Choice offered to:     DME Arranged:    DME Agency:     HH Arranged:    HH Agency:     Status of Service:  In process, will continue to follow  Medicare Important Message Given:    Date Medicare IM Given:    Medicare IM give by:    Date Additional Medicare IM Given:    Additional Medicare Important Message give by:     If discussed at Long Length of Stay Meetings, dates discussed:    Additional Comments:  Katherine [name], Katherine Brickner, RN 09/26/2015, 4:19 PM

## 2015-09-26 NOTE — Clinical Social Work Placement (Addendum)
   CLINICAL SOCIAL WORK PLACEMENT  NOTE  Date:  09/26/2015  Patient Details  Name: Katherine [name] MRN: [id number] Date of Birth: 1934/05/14  Clinical Social Work is seeking post-discharge placement for this patient at the Skilled  Nursing Facility level of care (*CSW will initial, date and re-position this form in  chart as items are completed):  Yes   Patient/family provided with [facility] Social Work Department's list of facilities offering this level of care within the geographic area requested by the patient (or if unable, by the patient's family).  Yes   Patient/family informed of their freedom to choose among providers that offer the needed level of care, that participate in Medicare, Medicaid or managed care program needed by the patient, have an available bed and are willing to accept the patient.  Yes   Patient/family informed of 's ownership interest in [facility] and [facility], as well as of the fact that they are under no obligation to receive care at these facilities.  PASRR submitted to EDS on       PASRR number received on       Existing PASRR number confirmed on 09/26/15     FL2 transmitted to all facilities in geographic area requested by pt/family on 09/26/15     FL2 transmitted to all facilities within larger geographic area on 09/26/15     Patient informed that his/her managed care company has contracts with or will negotiate with certain facilities, including the following:         10-12-15 Patient/family informed of bed offers received.  (Updated Windell MouldingEric Aliza Moret, MSW, Theresia MajorsLCSWA 10-14-15)  Patient chooses bed at   [facility] (Updated Windell MouldingEric Damani Rando, MSW, Theresia MajorsLCSWA 10-14-15)    Physician recommends and patient chooses bed at      [facility]  [facility]15 (Updated Windell MouldingEric Sofia Jaquith, MSW, LCSWA 10-14-15).  Patient to be transferred to facility by  [facility] (Updated Windell MouldingEric Itzayana Pardy, MSW, LCSWA 10-14-15)     Patient  family notified on  10-14-15 of transfer. (Updated Windell MouldingEric Krystelle Prashad, MSW, Theresia MajorsLCSWA 10-14-15)  Name of family member notified:   [name] patient's daughter [phone] (Updated Windell Mouldingric Pakou Rainbow, MSW, LCSWA 10-14-15)    PHYSICIAN       Additional Comment:    _______________________________________________ Scharlene Glossallie Taylor, Student-SW 09/26/2015, 3:28 PM   Ervin KnackEric R. Hassan Rowannterhaus, MSW, Theresia MajorsLCSWA [phone] 10/14/2015 3:16 PM (Updated Windell MouldingEric Jerrik Housholder, MSW, Theresia MajorsLCSWA 10-14-15)

## 2015-09-26 NOTE — Progress Notes (Addendum)
[facility] PROGRESS NOTE Interim History: 82 y.o. female with a medical history of hypertension, renal disorder, diabetes, but presented to the emergency department with complaints of shortness of breath. Patient has noticed weight gain and increase Le edema. Reports some diet indiscretion and was off diuretics for 2-3 weeks during recent acute decompensation of renal failure. Elevated BNP and CXR suggesting vascular congestion and CHF exacerbation. Patient w/o CP, but with positive orthopnea.   Filed Weights   09/25/15 1455 09/26/15 0444  Weight: 130.6 kg (287 lb 14.7 oz) 128.7 kg (283 lb 11.7 oz)        Intake/Output Summary (Last 24 hours) at 09/26/15 2013 Last data filed at 09/26/15 1820  Gross per 24 hour  Intake    960 ml  Output   1080 ml  Net   -120 ml     Assessment/Plan: Acute on chronic diastolic CHF (congestive heart failure) (HCC) -due to discontinuation of diuretics for 2 weeks or so during decompensation of renal failure as an outpatient -patient also with diet indiscretion -will continue daily weight, strict inatke and output and IV lasix (dose reviewed and adjusted as per renal service rec's) -low sodium diet -follow clinical response  -continue b-blocker and nitrates -no ARB/or ACE due to renal failure  Chronic renal failure: stage 4 -baseline Cr 1.7--1.9; but went up to 3 recently -will avoid nephrotoxic agents -follow renal function trend -renal aware of admission (patient with only 1 functional kidney)  DM (diabetes mellitus) (HCC) -continue SSI and Lantus -follow CBG's AC/HS and adjust insulin regimen base on CBG's fluctuation.  Dyslipidemia -continue statins   HTN (hypertension) -BP is fair controlled -will continue current regimen -HCTZ and ARB discontinued -on higher doses of lasix and BIDIL now  Morbid obesity (HCC) -advise to follow low calorie diet and to increase physical activity -Body mass index is 48.68 kg/(m^2).      Code Status: Full Family Communication: no family at bedside  Disposition Plan: will remains inpatient, continue IV lasix; follow renal function trend    Consultants:  Curbside with Renal (Dr. Eliott Nineunham will follow renal function, recommended insertion of foley and continue lasix 80mg  IV; avoid any further nephrotoxic agents)  Procedures: ECHO: 09/25/15 - Left ventricle: The cavity size was normal. Wall thickness was  increased in a pattern of mild LVH. Systolic function was normal.  The estimated ejection fraction was in the range of 55% to 60%.  Wall motion was normal; there were no regional wall motion  abnormalities. Doppler parameters are consistent with abnormal  left ventricular relaxation (grade 1 diastolic dysfunction). - Mitral valve: Calcified annulus. - Left atrium: The atrium was mildly dilated.  Impressions: - Normal LV function; mild LVH; grade 1 diastolic dysfunction;  trace MR; mild LAE; trace TR.  Antibiotics: None   HPI/Subjective: Afebrile and w/o CP. Reports some improvement in her breathing; but is still with frank signs of fluid overload.  Objective: Filed Vitals:   09/25/15 2248 09/26/15 0444 09/26/15 1231 09/26/15 1700  BP:  165/58  153/66  Pulse:  66  77  Temp:  98 F (36.7 C)  98.5 F (36.9 C)  TempSrc:  Oral  Oral  Resp: 20 22  22   Height:      Weight:  128.7 kg (283 lb 11.7 oz)    SpO2:  99% 93% 95%     Exam:  General: Alert, awake, oriented x3, in no acute distress. Denies CP and reports breathing is better. HEENT: No bruits, no goiter;  mild JVD on exam Heart: Regular rate and rhythm, without murmurs, rubs or gallops.  Lungs: bibasilar crackles, no wheezing and with positive scattered rhonchi Abdomen: Soft, nontender, nondistended, positive bowel sounds.  Neuro: Grossly intact, nonfocal. Extremities: 2+ edema bilaterally, no cyanosis    Data Reviewed: Basic Metabolic Panel:  Recent Labs Lab 09/25/15 1223 09/26/15 0527   NA 143 149*  K 3.7 3.5  CL 110 113*  CO2 23 26  GLUCOSE 216* 106*  BUN 37* 36*  CREATININE 1.95* 2.01*  CALCIUM 8.9 9.0   GFR Estimated Creatinine Clearance: 30.2 mL/min (by C-G formula based on Cr of 2.01).  Liver Function Tests:  Recent Labs Lab 09/25/15 1223  AST 21  ALT 17  ALKPHOS 121  BILITOT 0.7  PROT 7.3  ALBUMIN 3.3*   CBC:  Recent Labs Lab 09/25/15 1223  WBC 11.2*  NEUTROABS 9.3*  HGB 10.2*  HCT 31.1*  MCV 76.8*  PLT 212   Cardiac Enzymes:  Recent Labs Lab 09/25/15 1223  TROPONINI <0.03   BNP (last 3 results)  Recent Labs  09/25/15 1223  BNP 381.4*    ProBNP (last 3 results) No results for input(s): PROBNP in the last 8760 hours.  CBG:  Recent Labs Lab 09/25/15 2341 09/26/15 0442 09/26/15 0806 09/26/15 1219 09/26/15 1703  GLUCAP 172* 79 156* 160* 218*   Studies: Dg Chest 2 View  09/25/2015  CLINICAL DATA:  82 year old female with dyspnea and shortness of breath which has been progressive over the past 2 weeks EXAM: CHEST  2 VIEW COMPARISON:  Prior chest x-ray 08/21/2015 FINDINGS: Cardiomegaly is similar compared to prior. Atherosclerotic calcifications present in the transverse aorta. Interval development of bilateral small layering pleural effusions and associated bibasilar atelectasis. Increased pulmonary vascular congestion now with mild interstitial edema. No acute osseous abnormality. IMPRESSION: Radiographic findings are most consistent with mild -moderate CHF and bilateral small layering pleural effusions. Electronically Signed   By: Malachy Moan M.D.   On: 09/25/2015 13:20    Scheduled Meds: . amLODipine  10 mg Oral q morning - 10a  . aspirin EC  81 mg Oral Daily  . carvedilol  25 mg Oral BID WC  . cloNIDine  0.2 mg Oral q morning - 10a  . furosemide  80 mg Intravenous BID  . heparin  5,000 Units Subcutaneous Q8H  . insulin aspart  0-9 Units Subcutaneous Q4H  . insulin glargine  25 Units Subcutaneous QPM  .  isosorbide-hydrALAZINE  1 tablet Oral TID  . rosuvastatin  10 mg Oral q1800  . sodium chloride flush  3 mL Intravenous Q12H   Continuous Infusions:   Time: 35 minutes  Vassie Loll  [facility] Pager [phone]. If 7PM-7AM, please contact night-coverage at [email or web address], password [facility] 09/26/2015, 8:13 PM  LOS: 1 day

## 2015-09-26 NOTE — [place] FL2 (Signed)
[place] MEDICAID FL2 LEVEL OF CARE SCREENING TOOL     IDENTIFICATION  Patient Name: Katherine [name] Birthdate: 1934-02-05 Sex: female Admission Date (Current Location): 09/25/2015  [facility] and [place] Number:  Producer, television/film/video and Address:  [facility],  [address], [place] [address]      Provider Number: [id number]  Attending Physician Name and Address:  Vassie Loll, MD  Relative Name and Phone Number:       Current Level of Care: Hospital Recommended Level of Care: Skilled Nursing Facility Prior Approval Number:    Date Approved/Denied:   PASRR Number: [id number]  Discharge Plan: SNF    Current Diagnoses: Patient Active Problem List   Diagnosis Date Noted  . Pulmonary edema 09/25/2015  . CHF (congestive heart failure) (HCC) 09/25/2015  . Acute diastolic CHF (congestive heart failure) (HCC) 02/14/2014  . Hypoxia 02/11/2014  . Acute respiratory failure with hypoxia (HCC) 02/11/2014  . Nausea alone 02/11/2014  . Acute-on-chronic renal failure (HCC) 01/25/2014  . OA (osteoarthritis) of knee 01/20/2014  . Acute kidney injury (HCC) 02/05/2012  . Altered mental status 02/03/2012  . Hypoglycemia 02/03/2012  . DM (diabetes mellitus) (HCC) 02/03/2012  . HTN (hypertension) 02/03/2012  . Dyslipidemia 02/03/2012  . Morbid obesity (HCC) 02/03/2012  . CKD (chronic kidney disease) stage 2, GFR 60-89 ml/min 02/03/2012    Orientation RESPIRATION BLADDER Height & Weight     Self, Time, Situation, Place  Normal Incontinent Weight: 283 lb 11.7 oz (128.7 kg) Height:   (162.6 cm)  BEHAVIORAL SYMPTOMS/MOOD NEUROLOGICAL BOWEL NUTRITION STATUS   (None)  (None) Continent (Exceptions- urgency) Diet (Diet heart healthy/carb modified Room service appropriate; Fluid consistency: Thin; Fluid restriction: 1500 mL Fluid)  AMBULATORY STATUS COMMUNICATION OF NEEDS Skin   Limited Assist Verbally Normal                       Personal Care Assistance  Level of Assistance  Bathing, Feeding, Dressing Bathing Assistance: Limited assistance Feeding assistance: Independent Dressing Assistance: Limited assistance     Functional Limitations Info  Sight, Hearing, Speech Sight Info: Impaired Hearing Info: Adequate Speech Info: Adequate    SPECIAL CARE FACTORS FREQUENCY  PT (By licensed PT), OT (By licensed OT)     PT Frequency: 5 x week OT Frequency: 5 x week            Contractures Contractures Info: Not present    Additional Factors Info  Code Status, Allergies Code Status Info: FULL Code Allergies Info: Peanut-containing Drug Products           Current Medications (09/26/2015):  This is the current hospital active medication list Current Facility-Administered Medications  Medication Dose Route Frequency Provider Last Rate Last Dose  . 0.9 %  sodium chloride infusion  250 mL Intravenous PRN Maryann Mikhail, DO      . acetaminophen (TYLENOL) tablet 650 mg  650 mg Oral Q4H PRN Maryann Mikhail, DO   650 mg at 09/26/15 1213  . amLODipine (NORVASC) tablet 10 mg  10 mg Oral q morning - 10a Maryann Mikhail, DO   10 mg at 09/26/15 0933  . aspirin EC tablet 81 mg  81 mg Oral Daily Maryann Mikhail, DO   81 mg at 09/26/15 5621  . carvedilol (COREG) tablet 25 mg  25 mg Oral BID WC Maryann Mikhail, DO   25 mg at 09/26/15 0834  . cloNIDine (CATAPRES) tablet 0.2 mg  0.2 mg Oral q morning -  10a Maryann Mikhail, DO   0.2 mg at 09/26/15 [id number]  . furosemide (LASIX) injection 80 mg  80 mg Intravenous BID Maryann Mikhail, DO   80 mg at 09/26/15 0834  . heparin injection 5,000 Units  5,000 Units Subcutaneous Q8H Maryann Mikhail, DO   5,000 Units at 09/26/15 [id number]  . insulin aspart (novoLOG) injection 0-9 Units  0-9 Units Subcutaneous Q4H Maryann Mikhail, DO   2 Units at 09/26/15 1240  . insulin glargine (LANTUS) injection 25 Units  25 Units Subcutaneous QPM Maryann Mikhail, DO   25 Units at 09/25/15 2251  . isosorbide-hydrALAZINE (BIDIL) 20-37.5 MG  per tablet 1 tablet  1 tablet Oral TID Vassie Lollarlos Madera, MD   1 tablet at 09/26/15 0934  . ondansetron (ZOFRAN) injection 4 mg  4 mg Intravenous Q6H PRN Maryann Mikhail, DO      . rosuvastatin (CRESTOR) tablet 10 mg  10 mg Oral q1800 Maryann Mikhail, DO   10 mg at 09/25/15 1729  . sodium chloride flush (NS) 0.9 % injection 3 mL  3 mL Intravenous Q12H Maryann Mikhail, DO   3 mL at 09/25/15 1733  . sodium chloride flush (NS) 0.9 % injection 3 mL  3 mL Intravenous PRN Edsel PetrinMaryann Mikhail, DO         Discharge Medications: Please see discharge summary for a list of discharge medications.  Relevant Imaging Results:  Relevant Lab Results:   Additional Information SSN: [id number]  Scharlene GlossCallie Geovany Trudo, Student-SW [phone]

## 2015-09-26 NOTE — Clinical Social Work Note (Signed)
Clinical Social Work Assessment  Patient Details  Name: Katherine [name] MRN: [id number] Date of Birth: 06/24/1933  Date of referral:  09/26/15               Reason for consult:  Facility Placement                Permission sought to share information with:  Facility Contact Representative Permission granted to share information::  Yes, Verbal Permission Granted  Name::        Agency::  [place] [facility]  Relationship::     Contact Information:     Housing/Transportation Living arrangements for the past 2 months:  Single Family Home Source of Information:  Patient Patient Interpreter Needed:  None Criminal Activity/Legal Involvement Pertinent to Current Situation/Hospitalization:  No - Comment as needed Significant Relationships:  Adult Children Lives with:  Self Do you feel safe going back to the place where you live?  Yes Need for family participation in patient care:  No (Coment)  Care giving concerns:  Pt admitted from home alone. PT recommending short-term rehab at a SNF.   Social Worker assessment / plan:  CSW received PT recommendation. CSW received notification pt has been to [facility] for rehab in the past and was interested in returning. BSW Intern completed FL2 and faxed pt information via Epic hub. CSW confirmed with [facility] they can provide a bed.  BSW Intern met with pt at bedside. BSW intern introduced self and explained role. Pt confirms she lives at home alone and has been to [facility] before. BSW Intern explained PT recommendation and pt is agreeable to return to [facility] when medically ready for dc.  CSW confirmed bed offer with [facility].   CSW to continue to follow and provide disposition needs when appropriate.   Employment status:  Retired Insurance information:  Medicare PT Recommendations:  Skilled Nursing Facility Information / Referral to community resources:  Skilled Nursing Facility  Patient/Family's Response to care:  Pt alert  and oriented x4. Pt concerned about pain and if rehab will help.   Patient/Family's Understanding of and Emotional Response to Diagnosis, Current Treatment, and Prognosis:  Pt reports no further questions at this time.   Emotional Assessment Appearance:  Appears stated age Attitude/Demeanor/Rapport:  Other (Appropriate) Affect (typically observed):  Accepting, Calm Orientation:  Oriented to Self, Oriented to Place, Oriented to Situation, Oriented to  Time Alcohol / Substance use:  Not Applicable Psych involvement (Current and /or in the community):  No (Comment)  Discharge Needs  Concerns to be addressed:  Care Coordination Readmission within the last 30 days:  No Current discharge risk:  None Barriers to Discharge:  Continued Medical Work up   Callie Taylor, Student-SW 09/26/2015, 3:32 PM  

## 2015-09-26 NOTE — Progress Notes (Signed)
Patient is known to me from OP setting- I was informed of her admission.  So far, seems to be diuresing on IV diuretics and creatinine is fairly stable.  Would continue with same plan for now - renal will follow at a distance- call with any questions.   Ozil Stettler A [phone] [phone]

## 2015-09-26 NOTE — Progress Notes (Signed)
Pt is refusing foley catheter at this time. Also, upon assessment, this RN also noticed BIL LE slight red and warm with tenderness. Will inform on call night practitioner and make dayshift RN aware of this. Will continue to monitor pt closely. Mardene CelesteAsaro, Nari Vannatter I

## 2015-09-26 NOTE — Evaluation (Signed)
Physical Therapy Evaluation Patient Details Name: Katherine [name] MRN: [id number] DOB: 1933/10/24 Today's Date: 09/26/2015   History of Present Illness  82 y.o. female with h/o B TKA, HTN, DM, CKD, CHF admitted with fall, increased BLE edema, weakness, SOB. Dx of pulmonary edema.   Clinical Impression  Pt admitted with above diagnosis. Pt currently with functional limitations due to the deficits listed below (see PT Problem List). MIn A for sit to stand with RW. Pt unable to tolerate standing due to 7/10 L hip and 5/10 B feet pain. 2/4 dyspnea with minimal activity. SaO2 93% on RA with activity. Requested pain meds.  Pt will benefit from skilled PT to increase their independence and safety with mobility to allow discharge to the venue listed below.       Follow Up Recommendations SNF;Supervision/Assistance - 24 hour    Equipment Recommendations  Wheelchair (measurements PT)    Recommendations for Other Services OT consult     Precautions / Restrictions Precautions Precautions: Fall Precaution Comments: pt fell just PTA, she denies other falls in the past year Restrictions Weight Bearing Restrictions: No      Mobility  Bed Mobility Overal bed mobility: Modified Independent             General bed mobility comments: increased time, used rail, HOB up 35*  Transfers Overall transfer level: Needs assistance Equipment used: Rolling walker (2 wheeled) Transfers: Sit to/from Stand Sit to Stand: Min assist;From elevated surface         General transfer comment: increased time, bed elevated, pt stood for ~10 sec and was unable to tolerate longer 2* 7/10 L hip pain and 5/10 pain in B feet, pt unable to ambulate; 2/4 dyspnea with activity, SaO2 93% on RA with sit to stand  Ambulation/Gait                Stairs            Wheelchair Mobility    Modified Rankin (Stroke Patients Only)       Balance Overall balance assessment: Needs assistance   Sitting  balance-Leahy Scale: Good     Standing balance support: Bilateral upper extremity supported Standing balance-Leahy Scale: Poor                               Pertinent Vitals/Pain Pain Assessment: 0-10 Pain Score: 7  Pain Location: L hip and 5/10 B feet (plantar surface) Pain Descriptors / Indicators: Sore Pain Intervention(s): Monitored during session;Limited activity within patient's tolerance;Patient requesting pain meds-RN notified    Home Living Family/patient expects to be discharged to:: Private residence Living Arrangements: Alone Available Help at Discharge: Available PRN/intermittently   Home Access: Stairs to enter;Ramped entrance Entrance Stairs-Rails: Right Entrance Stairs-Number of Steps: 3 STE in front, ramp in back Home Layout: One level Home Equipment: Walker - 2 wheels;Cane - single point;Bedside commode;Shower seat      Prior Function Level of Independence: Independent with assistive device(s)         Comments: uses RW or cane     Hand Dominance        Extremity/Trunk Assessment   Upper Extremity Assessment: Overall WFL for tasks assessed           Lower Extremity Assessment: Generalized weakness (B knee extension 4/5, edema B feet, pt able to actively PF/DF B ankles, strength testing deferred 2* pain in B feet at plantar surfaces which is new pain  per pt)      Cervical / Trunk Assessment: Normal  Communication   Communication: No difficulties  Cognition Arousal/Alertness: Awake/alert Behavior During Therapy: WFL for tasks assessed/performed Overall Cognitive Status: Within Functional Limits for tasks assessed                      General Comments      Exercises        Assessment/Plan    PT Assessment Patient needs continued PT services  PT Diagnosis Difficulty walking;Generalized weakness;Acute pain   PT Problem List Decreased strength;Decreased activity tolerance;Decreased balance;Pain;Decreased  mobility;Obesity  PT Treatment Interventions Gait training;Functional mobility training;Stair training;Therapeutic activities;Patient/family education;Therapeutic exercise;Balance training   PT Goals (Current goals can be found in the Care Plan section) Acute Rehab PT Goals Patient Stated Goal: to be strong enough to go home PT Goal Formulation: With patient Time For Goal Achievement: 10/10/15 Potential to Achieve Goals: Good    Frequency Min 3X/week   Barriers to discharge Decreased caregiver support pt doesn't have 24* assist available    Co-evaluation               End of Session Equipment Utilized During Treatment: Gait belt Activity Tolerance: Patient limited by pain Patient left: in bed;with call bell/phone within reach;with bed alarm set Nurse Communication: Mobility status;Patient requests pain meds         Time: 1610-9604 PT Time Calculation (min) (ACUTE ONLY): 25 min   Charges:   PT Evaluation $PT Eval Moderate Complexity: 1 Procedure PT Treatments $Therapeutic Activity: 8-22 mins   PT G Codes:        Tamala Ser 09/26/2015, 12:46 PM [phone]

## 2015-09-27 DIAGNOSIS — N183 Chronic kidney disease, stage 3 (moderate): Secondary | ICD-10-CM

## 2015-09-27 LAB — BASIC METABOLIC PANEL
ANION GAP: 9 (ref 5–15)
BUN: 40 mg/dL — AB (ref 6–20)
CALCIUM: 8.7 mg/dL — AB (ref 8.9–10.3)
CO2: 27 mmol/L (ref 22–32)
Chloride: 111 mmol/L (ref 101–111)
Creatinine, Ser: 2.58 mg/dL — ABNORMAL HIGH (ref 0.44–1.00)
GFR calc Af Amer: 19 mL/min — ABNORMAL LOW (ref >60)
GFR, EST NON AFRICAN AMERICAN: 17 mL/min — AB (ref >60)
GLUCOSE: 108 mg/dL — AB (ref 65–99)
Potassium: 3.6 mmol/L (ref 3.5–5.1)
Sodium: 147 mmol/L — ABNORMAL HIGH (ref 135–145)

## 2015-09-27 LAB — CBC
HCT: 26.7 % — ABNORMAL LOW (ref 36.0–46.0)
Hemoglobin: 8.9 g/dL — ABNORMAL LOW (ref 12.0–15.0)
MCH: 25.3 pg — ABNORMAL LOW (ref 26.0–34.0)
MCHC: 33.3 g/dL (ref 30.0–36.0)
MCV: 75.9 fL — ABNORMAL LOW (ref 78.0–100.0)
PLATELETS: 198 10*3/uL (ref 150–400)
RBC: 3.52 MIL/uL — ABNORMAL LOW (ref 3.87–5.11)
RDW: 17.6 % — AB (ref 11.5–15.5)
WBC: 11 10*3/uL — AB (ref 4.0–10.5)

## 2015-09-27 LAB — GLUCOSE, CAPILLARY
GLUCOSE-CAPILLARY: 158 mg/dL — AB (ref 65–99)
GLUCOSE-CAPILLARY: 183 mg/dL — AB (ref 65–99)
GLUCOSE-CAPILLARY: 186 mg/dL — AB (ref 65–99)
GLUCOSE-CAPILLARY: 98 mg/dL (ref 65–99)
Glucose-Capillary: 102 mg/dL — ABNORMAL HIGH (ref 65–99)
Glucose-Capillary: 160 mg/dL — ABNORMAL HIGH (ref 65–99)

## 2015-09-27 MED ORDER — INSULIN GLARGINE 100 UNIT/ML ~~LOC~~ SOLN
10.0000 [IU] | Freq: Every evening | SUBCUTANEOUS | Status: DC
  Administered 2015-09-27: 10 [IU] via SUBCUTANEOUS
  Filled 2015-09-27: qty 0.1

## 2015-09-27 MED ORDER — DEXTROSE 5 % IV SOLN
160.0000 mg | Freq: Three times a day (TID) | INTRAVENOUS | Status: DC
  Administered 2015-09-27 – 2015-09-30 (×8): 160 mg via INTRAVENOUS
  Filled 2015-09-27: qty 16
  Filled 2015-09-27: qty 6
  Filled 2015-09-27 (×2): qty 16
  Filled 2015-09-27: qty 4
  Filled 2015-09-27: qty 2
  Filled 2015-09-27: qty 16
  Filled 2015-09-27: qty 10
  Filled 2015-09-27 (×3): qty 16

## 2015-09-27 MED ORDER — FUROSEMIDE 10 MG/ML IJ SOLN
80.0000 mg | Freq: Two times a day (BID) | INTRAMUSCULAR | Status: DC
  Administered 2015-09-27: 80 mg via INTRAVENOUS
  Filled 2015-09-27: qty 8

## 2015-09-27 MED ORDER — AMLODIPINE BESYLATE 5 MG PO TABS
5.0000 mg | ORAL_TABLET | Freq: Every morning | ORAL | Status: DC
  Administered 2015-09-28: 5 mg via ORAL
  Filled 2015-09-27: qty 1

## 2015-09-27 MED ORDER — INSULIN ASPART 100 UNIT/ML ~~LOC~~ SOLN
3.0000 [IU] | Freq: Three times a day (TID) | SUBCUTANEOUS | Status: DC
  Administered 2015-09-28 – 2015-10-14 (×20): 3 [IU] via SUBCUTANEOUS

## 2015-09-27 MED ORDER — INSULIN ASPART 100 UNIT/ML ~~LOC~~ SOLN
0.0000 [IU] | Freq: Three times a day (TID) | SUBCUTANEOUS | Status: DC
  Administered 2015-09-27 – 2015-09-28 (×4): 2 [IU] via SUBCUTANEOUS
  Administered 2015-09-29 (×3): 1 [IU] via SUBCUTANEOUS
  Administered 2015-09-30: 3 [IU] via SUBCUTANEOUS
  Administered 2015-09-30 (×2): 2 [IU] via SUBCUTANEOUS
  Administered 2015-10-01: 3 [IU] via SUBCUTANEOUS
  Administered 2015-10-01: 2 [IU] via SUBCUTANEOUS
  Administered 2015-10-01: 1 [IU] via SUBCUTANEOUS
  Administered 2015-10-02 – 2015-10-03 (×2): 5 [IU] via SUBCUTANEOUS
  Administered 2015-10-03: 3 [IU] via SUBCUTANEOUS
  Administered 2015-10-04 – 2015-10-05 (×3): 2 [IU] via SUBCUTANEOUS
  Administered 2015-10-05: 1 [IU] via SUBCUTANEOUS
  Administered 2015-10-06: 2 [IU] via SUBCUTANEOUS
  Administered 2015-10-07 (×2): 1 [IU] via SUBCUTANEOUS
  Administered 2015-10-07: 2 [IU] via SUBCUTANEOUS
  Administered 2015-10-08: 1 [IU] via SUBCUTANEOUS
  Administered 2015-10-08: 3 [IU] via SUBCUTANEOUS
  Administered 2015-10-08: 5 [IU] via SUBCUTANEOUS
  Administered 2015-10-09: 1 [IU] via SUBCUTANEOUS
  Administered 2015-10-09: 2 [IU] via SUBCUTANEOUS
  Administered 2015-10-10: 3 [IU] via SUBCUTANEOUS
  Administered 2015-10-10: 2 [IU] via SUBCUTANEOUS
  Administered 2015-10-10 – 2015-10-11 (×2): 1 [IU] via SUBCUTANEOUS
  Administered 2015-10-11: 3 [IU] via SUBCUTANEOUS
  Administered 2015-10-12: 1 [IU] via SUBCUTANEOUS
  Administered 2015-10-13 (×2): 2 [IU] via SUBCUTANEOUS
  Administered 2015-10-13: 1 [IU] via SUBCUTANEOUS

## 2015-09-27 NOTE — Progress Notes (Signed)
[facility] PROGRESS NOTE    Progress Note  LATIVIA [name]  ZOX:[id number] DOB: 1933/09/24 DOA: 09/25/2015 PCP: [name], MD  Outpatient Specialists:    Brief Narrative:   Katherine [name] is an 82 y.o. female past medical history chronic kidney C stage 3-4, essential hypertension that comes to the ED complaining of shortness of breath and weight gain with lower extremity edema, she reports diet indiscretion for the last 2 or 3 weeks, she had her diuretic discontinued 2 weeks prior to admission. I have consulted renal for concern for dialysis needs  in the near future.  Assessment/Plan:   Acute respiratory relief failure with hypoxia due to Acute diastolic CHF (congestive heart failure) (HCC)/chronic kidney disease stage 3-4: She still significant bloody fluid overloaded, with lower extremity edema positive JVD. Difficult situation as her renal function will continues to deteriorate with IV diuresis. Her baseline creatinine is around 1.7-1.9. She is at her lowest estimated dry weight recorded. Continue strict I's and O's daily weights, will cont. IV diuresis, she has a solitary kidney. We'll consult renal as there is a possibility that she may need dialysis.  DM (diabetes mellitus) (HCC) uncontrolled: Her last A1c was 8.6 and that was about 2 years ago. Decrease her long-acting insulin due to her worsening renal function, continue sliding scale insulin. Check hemoglobin A1c.  Essential  HTN (hypertension) Elevated, will continue clonidine and BiDil.  Dyslipidemia: Continue statins.  Morbid obesity (HCC): - Advise to follow low calorie diet and to increase physical activity - Body mass index is 48.68 kg/(m^2   DVT prophylaxis: heparin order Family Communication:none Disposition Plan: unable to determine Code Status:     Code Status Orders        Start     Ordered   09/25/15 1501  Full code   Continuous     09/25/15 1500    Code Status History    Date  Active Date Inactive Code Status Order ID Comments User Context   02/11/2014  2:04 PM 02/14/2014  7:28 PM Full Code [id number]  Eddie North, MD Inpatient   01/20/2014  4:22 PM 01/25/2014  6:54 PM Full Code [id number]  Loanne Drilling, MD Inpatient        IV Access:    Peripheral IV   Procedures and diagnostic studies:   Dg Chest 2 View  09/25/2015  CLINICAL DATA:  82 year old female with dyspnea and shortness of breath which has been progressive over the past 2 weeks EXAM: CHEST  2 VIEW COMPARISON:  Prior chest x-ray 08/21/2015 FINDINGS: Cardiomegaly is similar compared to prior. Atherosclerotic calcifications present in the transverse aorta. Interval development of bilateral small layering pleural effusions and associated bibasilar atelectasis. Increased pulmonary vascular congestion now with mild interstitial edema. No acute osseous abnormality. IMPRESSION: Radiographic findings are most consistent with mild -moderate CHF and bilateral small layering pleural effusions. Electronically Signed   By: Malachy Moan M.D.   On: 09/25/2015 13:20     Medical Consultants:    None.  Anti-Infectives:   None  Subjective:    Allyiah Virgina Jock she relates she still has orthopnea when lying flat, she relates her reading has improved slightly since admission.  Objective:    Filed Vitals:   09/26/15 1231 09/26/15 1700 09/26/15 2104 09/27/15 0418  BP:  153/66 147/64 142/58  Pulse:  77 69 67  Temp:  98.5 F (36.9 C) 98.9 F (37.2 C) 99.6 F (37.6 C)  TempSrc:  Oral Oral Oral  Resp:  22 20 20   Height:      Weight:    128.2 kg (282 lb 10.1 oz)  SpO2: 93% 95% 100% 95%    Intake/Output Summary (Last 24 hours) at 09/27/15 0836 Last data filed at 09/27/15 0422  Gross per 24 hour  Intake    840 ml  Output    800 ml  Net     40 ml   Filed Weights   09/25/15 1455 09/26/15 0444 09/27/15 0418  Weight: 130.6 kg (287 lb 14.7 oz) 128.7 kg (283 lb 11.7 oz) 128.2 kg (282 lb 10.1 oz)     Exam: Gen:  NAD Cardiovascular:  RRR, +JVD Chest and lungs:   Good air movement with crackles on the right with mild wheezing bilaterally. Abdomen:  Abdomen soft, NT/ND, + BS Extremities:  No C/E/C, some erythema which is chronic Psyq: appropriate.   Data Reviewed:    Labs: Basic Metabolic Panel:  Recent Labs Lab 09/25/15 1223 09/26/15 0527 09/27/15 0515  NA 143 149* 147*  K 3.7 3.5 3.6  CL 110 113* 111  CO2 23 26 27   GLUCOSE 216* 106* 108*  BUN 37* 36* 40*  CREATININE 1.95* 2.01* 2.58*  CALCIUM 8.9 9.0 8.7*   GFR Estimated Creatinine Clearance: 23.5 mL/min (by C-G formula based on Cr of 2.58). Liver Function Tests:  Recent Labs Lab 09/25/15 1223  AST 21  ALT 17  ALKPHOS 121  BILITOT 0.7  PROT 7.3  ALBUMIN 3.3*   No results for input(s): LIPASE, AMYLASE in the last 168 hours. No results for input(s): AMMONIA in the last 168 hours. Coagulation profile No results for input(s): INR, PROTIME in the last 168 hours.  CBC:  Recent Labs Lab 09/25/15 1223  WBC 11.2*  NEUTROABS 9.3*  HGB 10.2*  HCT 31.1*  MCV 76.8*  PLT 212   Cardiac Enzymes:  Recent Labs Lab 09/25/15 1223  TROPONINI <0.03   BNP (last 3 results) No results for input(s): PROBNP in the last 8760 hours. CBG:  Recent Labs Lab 09/26/15 1703 09/26/15 2007 09/27/15 0002 09/27/15 0405 09/27/15 0735  GLUCAP 218* 223* 158* 102* 98   D-Dimer: No results for input(s): DDIMER in the last 72 hours. Hgb A1c: No results for input(s): HGBA1C in the last 72 hours. Lipid Profile: No results for input(s): CHOL, HDL, LDLCALC, TRIG, CHOLHDL, LDLDIRECT in the last 72 hours. Thyroid function studies: No results for input(s): TSH, T4TOTAL, T3FREE, THYROIDAB in the last 72 hours.  Invalid input(s): FREET3 Anemia work up: No results for input(s): VITAMINB12, FOLATE, FERRITIN, TIBC, IRON, RETICCTPCT in the last 72 hours. Sepsis Labs:  Recent Labs Lab 09/25/15 1223  WBC 11.2*    Microbiology No results found for this or any previous visit (from the past 240 hour(s)).   Medications:   . amLODipine  10 mg Oral q morning - 10a  . aspirin EC  81 mg Oral Daily  . carvedilol  25 mg Oral BID WC  . cloNIDine  0.2 mg Oral q morning - 10a  . docusate sodium  200 mg Oral QHS  . furosemide  80 mg Intravenous BID  . heparin  5,000 Units Subcutaneous Q8H  . insulin aspart  0-9 Units Subcutaneous Q4H  . insulin glargine  25 Units Subcutaneous QPM  . isosorbide-hydrALAZINE  1 tablet Oral TID  . rosuvastatin  10 mg Oral q1800  . sodium chloride flush  3 mL Intravenous Q12H   Continuous Infusions:   Time spent: 25 min  LOS: 2 days   Marinda Elk  [facility] Pager [phone]  *Please refer to [email or web address], password [email or web address] to get updated schedule on who will round on this patient, as hospitalists switch teams weekly. If 7PM-7AM, please contact night-coverage at [email or web address], password [email or web address] for any overnight needs.  09/27/2015, 8:36 AM
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
## 2015-09-28 LAB — IRON AND TIBC
Iron: 12 ug/dL — ABNORMAL LOW (ref 28–170)
Saturation Ratios: 5 % — ABNORMAL LOW (ref 10.4–31.8)
TIBC: 223 ug/dL — AB (ref 250–450)
UIBC: 211 ug/dL

## 2015-09-28 LAB — CBC
HEMATOCRIT: 25.5 % — AB (ref 36.0–46.0)
HEMOGLOBIN: 8.4 g/dL — AB (ref 12.0–15.0)
MCH: 25.2 pg — AB (ref 26.0–34.0)
MCHC: 32.9 g/dL (ref 30.0–36.0)
MCV: 76.6 fL — AB (ref 78.0–100.0)
Platelets: 166 10*3/uL (ref 150–400)
RBC: 3.33 MIL/uL — ABNORMAL LOW (ref 3.87–5.11)
RDW: 17.8 % — AB (ref 11.5–15.5)
WBC: 9.8 10*3/uL (ref 4.0–10.5)

## 2015-09-28 LAB — RENAL FUNCTION PANEL
ALBUMIN: 2.7 g/dL — AB (ref 3.5–5.0)
ANION GAP: 10 (ref 5–15)
BUN: 48 mg/dL — AB (ref 6–20)
CALCIUM: 8.4 mg/dL — AB (ref 8.9–10.3)
CO2: 26 mmol/L (ref 22–32)
CREATININE: 3.23 mg/dL — AB (ref 0.44–1.00)
Chloride: 107 mmol/L (ref 101–111)
GFR calc Af Amer: 15 mL/min — ABNORMAL LOW (ref >60)
GFR, EST NON AFRICAN AMERICAN: 13 mL/min — AB (ref >60)
GLUCOSE: 128 mg/dL — AB (ref 65–99)
PHOSPHORUS: 3.8 mg/dL (ref 2.5–4.6)
POTASSIUM: 3.9 mmol/L (ref 3.5–5.1)
SODIUM: 143 mmol/L (ref 135–145)

## 2015-09-28 LAB — HEMOGLOBIN A1C
Hgb A1c MFr Bld: 9 % — ABNORMAL HIGH (ref 4.8–5.6)
Mean Plasma Glucose: 212 mg/dL

## 2015-09-28 LAB — GLUCOSE, CAPILLARY
GLUCOSE-CAPILLARY: 162 mg/dL — AB (ref 65–99)
Glucose-Capillary: 107 mg/dL — ABNORMAL HIGH (ref 65–99)
Glucose-Capillary: 183 mg/dL — ABNORMAL HIGH (ref 65–99)

## 2015-09-28 MED ORDER — SODIUM CHLORIDE 0.9 % IV SOLN
510.0000 mg | Freq: Once | INTRAVENOUS | Status: AC
  Administered 2015-09-28: 510 mg via INTRAVENOUS
  Filled 2015-09-28: qty 17

## 2015-09-28 MED ORDER — METOLAZONE 10 MG PO TABS
10.0000 mg | ORAL_TABLET | Freq: Every day | ORAL | Status: DC
  Administered 2015-09-28 – 2015-09-29 (×2): 10 mg via ORAL
  Filled 2015-09-28 (×3): qty 1

## 2015-09-28 NOTE — Progress Notes (Signed)
Physical Therapy Treatment Patient Details Name: Myrtice LauthClara P Divelbiss MRN: [id number] DOB: 07-10-1933 Today's Date: 09/28/2015    History of Present Illness 82 y.o. female with h/o B TKA, HTN, DM, CKD, CHF admitted with fall, increased BLE edema, weakness, SOB. Dx of pulmonary edema.     PT Comments    Pt assisted OOB to recliner today.  Pt declined further mobility due to not feeling well and fatigue.  Follow Up Recommendations  SNF;Supervision/Assistance - 24 hour     Equipment Recommendations  Wheelchair (measurements PT)    Recommendations for Other Services       Precautions / Restrictions Precautions Precautions: Fall Restrictions Weight Bearing Restrictions: No    Mobility  Bed Mobility Overal bed mobility: Needs Assistance Bed Mobility: Supine to Sit     Supine to sit: Min assist;HOB elevated     General bed mobility comments: assist for trunk upright, utilizes bed rail  Transfers Overall transfer level: Needs assistance Equipment used: Rolling walker (2 wheeled) Transfers: Sit to/from UGI CorporationStand;Stand Pivot Transfers Sit to Stand: Min assist;From elevated surface Stand pivot transfers: Min assist       General transfer comment: verbal cues for technique, rocking technique used to assist pt upright, pt reports fatigue and not feeling well so only pivoted to recliner ( declined ambulation) , pt denies SOB, remained on O2 [place]  Ambulation/Gait                 Stairs            Wheelchair Mobility    Modified Rankin (Stroke Patients Only)       Balance                                    Cognition Arousal/Alertness: Awake/alert Behavior During Therapy: WFL for tasks assessed/performed Overall Cognitive Status: Within Functional Limits for tasks assessed                      Exercises      General Comments        Pertinent Vitals/Pain Pain Assessment: 0-10 Pain Score: 5  Pain Location: L big toe Pain  Descriptors / Indicators: Sore Pain Intervention(s): Limited activity within patient's tolerance;Monitored during session;Repositioned    Home Living                      Prior Function            PT Goals (current goals can now be found in the care plan section) Progress towards PT goals: Progressing toward goals    Frequency  Min 3X/week    PT Plan Current plan remains appropriate    Co-evaluation             End of Session   Activity Tolerance: Patient limited by fatigue Patient left: with call bell/phone within reach;in chair;with chair alarm set     Time: [id number] PT Time Calculation (min) (ACUTE ONLY): 13 min  Charges:  $Therapeutic Activity: 8-22 mins                    G Codes:      Treniyah Lynn,KATHrine E 09/28/2015, 11:57 AM Zenovia JarredKati Kye Hedden, PT, DPT 09/28/2015 Pager: [phone]

## 2015-09-28 NOTE — Progress Notes (Signed)
[facility] PROGRESS NOTE    Progress Note  Katherine [name]  ZOX:[id number] DOB: 07/23/1933 DOA: 09/25/2015 PCP: Enrique SackGREEN, EDWIN JAY, MD  Outpatient Specialists:    Brief Narrative:   Katherine LauthClara P [name] is an 82 y.o. female past medical history chronic kidney C stage 3-4, essential hypertension that comes to the ED complaining of shortness of breath and weight gain with lower extremity edema, she reports diet indiscretion for the last 2 or 3 weeks, she had her diuretic discontinued 2 weeks prior to admission. I have consulted renal for concern for dialysis needs  in the near future.  Assessment/Plan:   Acute respiratory resp. failure with hypoxia due to Acute diastolic CHF (congestive heart failure) (HCC)/chronic kidney disease stage 3-4/Cardiorenal syndrome: She is still significant bloody fluid overloaded, with lower extremity edema positive JVD. Difficult situation as her renal function will continues to deteriorate.. She has a Foley in place and she has only had about 500 mL of urine right 160 mg of Lasix 3 times a day. Appreciate renal's assistance.  DM (diabetes mellitus) (HCC) uncontrolled: Discontinue her long-acting insulin due to her worsening renal function, continue sliding scale insulin.  A1c is 9.0  Essential  HTN (hypertension) Good control, will continue clonidine and BiDil.  Dyslipidemia: Continue statins.  Morbid obesity (HCC): - Advise to follow low calorie diet and to increase physical activity - Body mass index is 48.68 kg/(m^2   DVT prophylaxis: heparin order Family Communication:none Disposition Plan: unable to determine Code Status:     Code Status Orders        Start     Ordered   09/25/15 1501  Full code   Continuous     09/25/15 1500    Code Status History    Date Active Date Inactive Code Status Order ID Comments User Context   02/11/2014  2:04 PM 02/14/2014  7:28 PM Full Code [id number]  Eddie NorthNishant Dhungel, MD Inpatient   01/20/2014  4:22 PM  01/25/2014  6:54 PM Full Code [id number]  Loanne DrillingFrank Aluisio V, MD Inpatient        IV Access:    Peripheral IV   Procedures and diagnostic studies:   No results found.   Medical Consultants:    None.  Anti-Infectives:   None  Subjective:    Katherine [name] she relates she still has orthopnea when lying flat, she relates her reading has improved slightly since admission.  Objective:    Filed Vitals:   09/27/15 0418 09/27/15 1322 09/27/15 2232 09/28/15 0457  BP: 142/58 122/60 122/55 127/57  Pulse: 67 62 63 62  Temp: 99.6 F (37.6 C) 98.7 F (37.1 C) 98.4 F (36.9 C) 98.4 F (36.9 C)  TempSrc: Oral Oral Oral Oral  Resp: 20 18 18 20   Height:      Weight: 128.2 kg (282 lb 10.1 oz)   129.8 kg (286 lb 2.5 oz)  SpO2: 95% 100% 100% 100%    Intake/Output Summary (Last 24 hours) at 09/28/15 0915 Last data filed at 09/28/15 0819  Gross per 24 hour  Intake    100 ml  Output    600 ml  Net   -500 ml   Filed Weights   09/26/15 0444 09/27/15 0418 09/28/15 0457  Weight: 128.7 kg (283 lb 11.7 oz) 128.2 kg (282 lb 10.1 oz) 129.8 kg (286 lb 2.5 oz)    Exam: Gen:  NAD Cardiovascular:  RRR, +JVD Chest and lungs:   Good air movement with crackles on the right  with mild wheezing bilaterally. Abdomen:  Abdomen soft, NT/ND, + BS Extremities:  No C/E/C, some erythema which is chronic Psyq: appropriate.   Data Reviewed:    Labs: Basic Metabolic Panel:  Recent Labs Lab 09/25/15 1223 09/26/15 0527 09/27/15 0515 09/28/15 0450  NA 143 149* 147* 143  K 3.7 3.5 3.6 3.9  CL 110 113* 111 107  CO2 GLUCOSE 216* 106* 108* 128*  BUN 37* 36* 40* 48*  CREATININE 1.95* 2.01* 2.58* 3.23*  CALCIUM 8.9 9.0 8.7* 8.4*  PHOS  --   --   --  3.8   GFR Estimated Creatinine Clearance: 18.9 mL/min (by C-G formula based on Cr of 3.23). Liver Function Tests:  Recent Labs Lab 09/25/15 1223 09/28/15 0450  AST 21  --   ALT 17  --   ALKPHOS 121  --   BILITOT 0.7  --    PROT 7.3  --   ALBUMIN 3.3* 2.7*   No results for input(s): LIPASE, AMYLASE in the last 168 hours. No results for input(s): AMMONIA in the last 168 hours. Coagulation profile No results for input(s): INR, PROTIME in the last 168 hours.  CBC:  Recent Labs Lab 09/25/15 1223 09/27/15 0515 09/28/15 0450  WBC 11.2* 11.0* 9.8  NEUTROABS 9.3*  --   --   HGB 10.2* 8.9* 8.4*  HCT 31.1* 26.7* 25.5*  MCV 76.8* 75.9* 76.6*  PLT 212 198 166   Cardiac Enzymes:  Recent Labs Lab 09/25/15 1223  TROPONINI <0.03   BNP (last 3 results) No results for input(s): PROBNP in the last 8760 hours. CBG:  Recent Labs Lab 09/27/15 0735 09/27/15 1203 09/27/15 1623 09/27/15 2236 09/28/15 0738  GLUCAP 98 160* 183* 186* 107*   D-Dimer: No results for input(s): DDIMER in the last 72 hours. Hgb A1c:  Recent Labs  09/27/15 0515  HGBA1C 9.0*   Lipid Profile: No results for input(s): CHOL, HDL, LDLCALC, TRIG, CHOLHDL, LDLDIRECT in the last 72 hours. Thyroid function studies: No results for input(s): TSH, T4TOTAL, T3FREE, THYROIDAB in the last 72 hours.  Invalid input(s): FREET3 Anemia work up:  Recent Labs  09/28/15 0450  TIBC 223*  IRON 12*   Sepsis Labs:  Recent Labs Lab 09/25/15 1223 09/27/15 0515 09/28/15 0450  WBC 11.2* 11.0* 9.8   Microbiology No results found for this or any previous visit (from the past 240 hour(s)).   Medications:   . amLODipine  5 mg Oral q morning - 10a  . aspirin EC  81 mg Oral Daily  . carvedilol  25 mg Oral BID WC  . cloNIDine  0.2 mg Oral q morning - 10a  . docusate sodium  200 mg Oral QHS  . furosemide  160 mg Intravenous Q8H  . heparin  5,000 Units Subcutaneous Q8H  . insulin aspart  0-9 Units Subcutaneous TID WC  . insulin aspart  3 Units Subcutaneous TID WC  . insulin glargine  10 Units Subcutaneous QPM  . isosorbide-hydrALAZINE  1 tablet Oral TID  . rosuvastatin  10 mg Oral q1800  . sodium chloride flush  3 mL Intravenous  Q12H   Continuous Infusions:   Time spent: 15 min   LOS: 3 days   Marinda Elk  [facility] Pager [phone]  *Please refer to [email or web address], password [email or web address] to get updated schedule on who will round on this patient, as hospitalists switch teams weekly. If 7PM-7AM, please contact night-coverage at [email or web address], password [email or web address] for any overnight needs.  09/28/2015, 9:15 AM

## 2015-09-28 NOTE — Progress Notes (Signed)
[facility] Rounding Note  Subjective:  Feels the same. SOB with any exertion Making little urine despite high dose lasix Appetite OK  Objective Vital signs in last 24 hours: Filed Vitals:   09/27/15 2232 09/28/15 0457 09/28/15 0921 09/28/15 1525  BP: 122/55 127/57 125/56 127/56  Pulse: 63 62  57  Temp: 98.4 F (36.9 C) 98.4 F (36.9 C)  98 F (36.7 C)  TempSrc: Oral Oral  Oral  Resp: Height:      Weight:  129.8 kg (286 lb 2.5 oz)    SpO2: 100% 100%  100%   Weight change: 1.6 kg (3 lb 8.4 oz)  Intake/Output Summary (Last 24 hours) at 09/28/15 1536 Last data filed at 09/28/15 0900  Gross per 24 hour  Intake    340 ml  Output    400 ml  Net    -60 ml   Physical Exam:   BP 127/56 mmHg  Pulse 57  Temp(Src) 98 F (36.7 C) (Oral)  Resp 20  Ht  (1.626 m)  Wt 129.8 kg (286 lb 2.5 oz)  BMI 49.09 kg/m2  SpO2 100% Gen: Very pleasant older AAF, up in the chair, wearing O2 Skin: no rash, cyanosis. Area of bruising near R ankle (from fall prior to admission) Neck: + JVD to the jaw angle Chest: Bibasilar crackles with exp wheezing unchanged Heart: S1S2 No S3. Heart sounds distant Abdomen: Obese, no focal abd tenderness Ext: 3+ pitting edema to the knees, trace post thighs Area marked lower aspect RLE that is tender Neuro: alert, Ox3, no focal deficit Heme/Lymph: no bruising or LAN  Labs:   Recent Labs Lab 09/25/15 1223 09/26/15 0527 09/27/15 0515 09/28/15 0450  NA 143 149* 147* 143  K 3.7 3.5 3.6 3.9  CL 110 113* 111 107  CO2 GLUCOSE 216* 106* 108* 128*  BUN 37* 36* 40* 48*  CREATININE 1.95* 2.01* 2.58* 3.23*  CALCIUM 8.9 9.0 8.7* 8.4*  PHOS  --   --   --  3.8     Recent Labs Lab 09/25/15 1223 09/28/15 0450  AST 21  --   ALT 17  --   ALKPHOS 121  --   BILITOT 0.7  --   PROT 7.3  --   ALBUMIN 3.3* 2.7*     Recent Labs Lab 09/25/15 1223 09/27/15 0515 09/28/15 0450  WBC 11.2* 11.0* 9.8  NEUTROABS 9.3*  --   --   HGB  10.2* 8.9* 8.4*  HCT 31.1* 26.7* 25.5*  MCV 76.8* 75.9* 76.6*  PLT 212 198 166   Results for KEVIA, ZAUCHA (MRN [id number]) as of 09/28/2015 15:31  Ref. Range 09/28/2015 04:50  Iron Latest Ref Range: 28-170 ug/dL 12 (L)  UIBC Latest Units: ug/dL 409  TIBC Latest Ref Range: 250-450 ug/dL 811 (L)  Saturation Ratios Latest Ref Range: 10.4-31.8 % 5 (L)    Recent Labs Lab 09/25/15 1223  TROPONINI <0.03     Recent Labs Lab 09/27/15 0735 09/27/15 1203 09/27/15 1623 09/27/15 2236 09/28/15 0738  GLUCAP 98 160* 183* 186* 107*      Recent Labs Lab 09/28/15 0450  IRON 12*  TIBC 223*   Creatinine Trending for reference Date/Time Value  09/27/2015 05:15 AM 2.58*  09/26/2015 05:27 AM 2.01*  09/25/2015 12:23 PM 1.95*  09/21/2015 1.94 ([facility])  08/21/2015 06:52 AM 3.03* (ED)  08/17/2015 3.22 ([facility]) losartan and lasix stopped  07/2015 Seen by Dr. Kathrene Bongo, Armond Hang stopped,  lasix 40 bid added, losartan continued  06/2015 2.76 (outside office)  01/2015 2.57 (outside office)  02/14/2014 04:38 AM 1.62*  02/13/2014 05:30 AM 1.77*  02/12/2014 05:16 AM 1.84*        Medications:   . aspirin EC  81 mg Oral Daily  . carvedilol  25 mg Oral BID WC  . cloNIDine  0.2 mg Oral q morning - 10a  . docusate sodium  200 mg Oral QHS  . furosemide  160 mg Intravenous Q8H  . heparin  5,000 Units Subcutaneous Q8H  . insulin aspart  0-9 Units Subcutaneous TID WC  . insulin aspart  3 Units Subcutaneous TID WC  . isosorbide-hydrALAZINE  1 tablet Oral TID  . metolazone  10 mg Oral Daily  . rosuvastatin  10 mg Oral q1800  . sodium chloride flush  3 mL Intravenous Q12H   Background 82 yo AAF with background of DM, HTN, HLD and solitary functioning R kidney (atrophic calcified left kidney). Appears baseline creatinine most recently around 2, but seen by Dr. Kathrene BongoGoldsborough in consultation in February 2017 because of an upward trend to as high as 2.76 (on ARB and NSAID  at that particular time). Was as high as 3.22 in the setting of losartan and lasix 08/2015. Now she has recurrent AKI in the setting of diastolic CHF and creatinine is rising likely due to cardiorenal contribution (and NOT related to diuresis, as she has made little urine so far this admission).  Assessment/Recommendations  1. AKI on CKD4 with solitary functioning R kidney likely due to cardiorenal contribution (and NOT related to diuresis, as she has made little urine so far this admission). I think her kidney function over the past year (and esp the last 6 months) has been tenuous at best, with little renal reserve (several prior episodes AKI)  Creatinine continues to rise. Not diuresing at all on 160 TID  IV lasix.  1. Add metolazone 10 mg/day - first dose today 2. If she does not diurese on this regimen (and I suspect she won't), [facility] and HD and she is agreeable to that. We have talked at length about the process, and possible outcomes (including need for long term dialysis) 2. Acute diastolic CHF - EF 55-60%. Grade 1 DD by ECHO. 3. HTN - received 1 dose of losartan this admission, but has now been stopped. Amlodipine reduced yesterday to allow BP to drift up some for renal perfusion but really no change BP 1. Stop amlodipine, continue carvedilol and bi-dil. 4. Anemia - Hb 11.4 08/2015, 10.2 on admission, 8.4 today. No bleeding reported. Very low iron stores 1. Feraheme 510 IV  5. DM2 - per primary service 6. HLD - on statin   Katherine Balynthia Audry Pecina, MD [facility] [phone] pager 09/28/2015, 3:36 PM

## 2015-09-28 NOTE — Care Management Important Message (Signed)
Important Message  Patient Details  Name: Katherine [name] MRN: [id number] Date of Birth: May 19, 1934   Medicare Important Message Given:  Yes    Haskell FlirtJamison, Randeep Biondolillo 09/28/2015, 10:24 AMImportant Message  Patient Details  Name: Katherine [name] MRN: [id number] Date of Birth: May 19, 1934   Medicare Important Message Given:  Yes    Haskell FlirtJamison, Josip Merolla 09/28/2015, 10:23 AM

## 2015-09-29 LAB — RENAL FUNCTION PANEL
Albumin: 2.7 g/dL — ABNORMAL LOW (ref 3.5–5.0)
Anion gap: 11 (ref 5–15)
BUN: 56 mg/dL — AB (ref 6–20)
CHLORIDE: 107 mmol/L (ref 101–111)
CO2: 27 mmol/L (ref 22–32)
Calcium: 8.7 mg/dL — ABNORMAL LOW (ref 8.9–10.3)
Creatinine, Ser: 3.47 mg/dL — ABNORMAL HIGH (ref 0.44–1.00)
GFR calc Af Amer: 13 mL/min — ABNORMAL LOW (ref >60)
GFR calc non Af Amer: 12 mL/min — ABNORMAL LOW (ref >60)
GLUCOSE: 133 mg/dL — AB (ref 65–99)
POTASSIUM: 4 mmol/L (ref 3.5–5.1)
Phosphorus: 4.4 mg/dL (ref 2.5–4.6)
Sodium: 145 mmol/L (ref 135–145)

## 2015-09-29 LAB — CBC
HEMATOCRIT: 25.7 % — AB (ref 36.0–46.0)
Hemoglobin: 8.4 g/dL — ABNORMAL LOW (ref 12.0–15.0)
MCH: 25.5 pg — ABNORMAL LOW (ref 26.0–34.0)
MCHC: 32.7 g/dL (ref 30.0–36.0)
MCV: 77.9 fL — AB (ref 78.0–100.0)
Platelets: 205 10*3/uL (ref 150–400)
RBC: 3.3 MIL/uL — ABNORMAL LOW (ref 3.87–5.11)
RDW: 17.7 % — AB (ref 11.5–15.5)
WBC: 8.6 10*3/uL (ref 4.0–10.5)

## 2015-09-29 LAB — GLUCOSE, CAPILLARY
GLUCOSE-CAPILLARY: 214 mg/dL — AB (ref 65–99)
GLUCOSE-CAPILLARY: 149 mg/dL — AB (ref 65–99)
GLUCOSE-CAPILLARY: 137 mg/dL — AB (ref 65–99)
Glucose-Capillary: 158 mg/dL — ABNORMAL HIGH (ref 65–99)
Glucose-Capillary: 129 mg/dL — ABNORMAL HIGH (ref 65–99)
Glucose-Capillary: 189 mg/dL — ABNORMAL HIGH (ref 65–99)

## 2015-09-29 MED ORDER — PNEUMOCOCCAL VAC POLYVALENT 25 MCG/0.5ML IJ INJ
0.5000 mL | INJECTION | INTRAMUSCULAR | Status: DC
  Filled 2015-09-29: qty 0.5

## 2015-09-29 NOTE — Progress Notes (Signed)
Arrival Method: via stretcher Mental Status: alert and oriented x 4 Telemetry: n/a Skin: assessment documented Tubes: n/a IV: left hand Pain: denies Family: none present Living Situation: home alone Safety Measures: bed in lowest position, call bell in reach, non skid socks offered but refused 6E Orientation: oriented to staff and unit

## 2015-09-29 NOTE — Clinical Social Work Placement (Signed)
[facility]. CSW provided handoff to 6E CSW [phone]. CSW had assessed patient & provided SNF bed offers & [facility] - [facility] if dialysis is needed.    Lincoln MaxinKelly Floria Brandau, LCSW [facility] Clinical Social Worker cell #: [phone]   CLINICAL SOCIAL WORK PLACEMENT  NOTE  Date:  09/29/2015  Patient Details  Name: Katherine LauthClara P Kakos MRN: [id number] Date of Birth: Oct 26, 1933  Clinical Social Work is seeking post-discharge placement for this patient at the Skilled  Nursing Facility level of care (*CSW will initial, date and re-position this form in  chart as items are completed):  Yes   Patient/family provided with [facility] Clinical Social Work Department's list of facilities offering this level of care within the geographic area requested by the patient (or if unable, by the patient's family).  Yes   Patient/family informed of their freedom to choose among providers that offer the needed level of care, that participate in Medicare, Medicaid or managed care program needed by the patient, have an available bed and are willing to accept the patient.  Yes   Patient/family informed of [facility]'s ownership interest in [facility] and [facility], as well as of the fact that they are under no obligation to receive care at these facilities.  PASRR submitted to EDS on       PASRR number received on       Existing PASRR number confirmed on 09/26/15     FL2 transmitted to all facilities in geographic area requested by pt/family on 09/26/15     FL2 transmitted to all facilities within larger geographic area on 09/26/15     Patient informed that his/her managed care company has contracts with or will negotiate with certain facilities, including the following:        Yes   Patient/family informed of bed offers received.  [facility]      Physician recommends and patient chooses bed at      Patient to be transferred to   on  .  Patient to be transferred to facility by       Patient family notified on   of transfer.  Name of family member notified:        PHYSICIAN       Additional Comment:    _______________________________________________ Arlyss RepressHarrison, Seville Downs F, LCSW 09/29/2015, 2:01 PM

## 2015-09-29 NOTE — Progress Notes (Signed)
Patient ID: Katherine [name], female   DOB: [birth year], 82 y.o.   MRN: [id number]  [facility] Progress Note    Assessment/ Plan:   1. AKI on chronic kidney disease stage IV with a solitary functioning right kidney: Chronic cardiorenal syndrome and currently appears to be refractory to diuretic therapy-some improvement of urinary output overnight with the addition of metolazone however not enough to constitute a successful diuresis. Awaiting assistance from interventional radiology for placement of tunneled dialysis catheter followed by dialysis later today or tomorrow. She does not have any uremic signs or symptoms and does not have any acute electrolyte abnormality to prompt intervention. 2. Acute diastolic heart failure (EF 55-60%): Unsuccessful diuretic therapy response so far, to start hemodialysis for volume unloading. 3. Anemia: Status post intravenous iron, without overt blood loss, low hemoglobin levels noted. 4. Hypertension: Blood pressures acceptable-amlodipine stopped and carvedilol/BiDil continued to allow for renal perfusion  Subjective:   She reports that she is very apprehensive about dialysis and understands that she needs it given resistance to diuretic therapy. She denies any chest pain and continues to have shortness of breath with minimal exertion.    Objective:   BP 131/54 mmHg  Pulse 62  Temp(Src) 98 F (36.7 C) (Oral)  Resp 20  Ht 5\' 4"  (1.626 m)  Wt 127.9 kg (281 lb 15.5 oz)  BMI 48.38 kg/m2  SpO2 100%  Intake/Output Summary (Last 24 hours) at 09/29/15 1611 Last data filed at 09/29/15 1300  Gross per 24 hour  Intake    372 ml  Output    650 ml  Net   -278 ml   Weight change: -0.2 kg (-7.1 oz)  Physical Exam: Gen: Anxiously resting in bed CVS: Pulse sinus rhythm, normal rate, S1 and S2 normal  Resp: Poor inspiratory effort with bibasilar rales Abd: Soft, obese, nontender Ext: 3+ lower extremity edema bilaterally  Imaging: No results  found.  Labs: BMET  Recent Labs Lab 09/25/15 1223 09/26/15 0527 09/27/15 0515 09/28/15 0450 09/29/15 0433  NA 143 149* 147* 143 145  K 3.7 3.5 3.6 3.9 4.0  CL 110 113* 111 107 107  CO2 23 26 27 26 27   GLUCOSE 216* 106* 108* 128* 133*  BUN 37* 36* 40* 48* 56*  CREATININE 1.95* 2.01* 2.58* 3.23* 3.47*  CALCIUM 8.9 9.0 8.7* 8.4* 8.7*  PHOS  --   --   --  3.8 4.4   CBC  Recent Labs Lab 09/25/15 1223 09/27/15 0515 09/28/15 0450 09/29/15 0433  WBC 11.2* 11.0* 9.8 8.6  NEUTROABS 9.3*  --   --   --   HGB 10.2* 8.9* 8.4* 8.4*  HCT 31.1* 26.7* 25.5* 25.7*  MCV 76.8* 75.9* 76.6* 77.9*  PLT 212 198 166 205   Medications:    . aspirin EC  81 mg Oral Daily  . carvedilol  25 mg Oral BID WC  . docusate sodium  200 mg Oral QHS  . furosemide  160 mg Intravenous Q8H  . heparin  5,000 Units Subcutaneous Q8H  . insulin aspart  0-9 Units Subcutaneous TID WC  . insulin aspart  3 Units Subcutaneous TID WC  . isosorbide-hydrALAZINE  1 tablet Oral TID  . metolazone  10 mg Oral Daily  . rosuvastatin  10 mg Oral q1800  . sodium chloride flush  3 mL Intravenous Q12H   Zetta BillsJay Damari Suastegui, MD 09/29/2015, 4:11 PM

## 2015-09-29 NOTE — Care Management Note (Signed)
Case Management Note  Patient Details  Name: Katherine [name] MRN: [id number] Date of Birth: [birth year]  Subjective/Objective:  For [facility] to [facility]. Nephrology following. From home. PT-[facility] SNF. CSW following.                  Action/Plan:d/c plan SNF.   Expected Discharge Date:                  Expected Discharge Plan:  Skilled Nursing Facility  In-House Referral:  Clinical Social Work  Discharge planning Services  CM Consult  Post Acute Care Choice:    Choice offered to:     DME Arranged:    DME Agency:     HH Arranged:    HH Agency:     Status of Service:  In process, will continue to follow  Medicare Important Message Given:  Yes Date Medicare IM Given:    Medicare IM give by:    Date Additional Medicare IM Given:    Additional Medicare Important Message give by:     If discussed at Long Length of Stay Meetings, dates discussed:    Additional Comments:  Lanier ClamMahabir, Martika Egler, RN 09/29/2015, 11:23 AM

## 2015-09-29 NOTE — Progress Notes (Signed)
[facility] PROGRESS NOTE    Progress Note  Katherine [name]  QMV:[id number] DOB: 11-Jan-1934 DOA: 09/25/2015 PCP: Enrique SackGREEN, EDWIN JAY, MD  Outpatient Specialists:    Brief Narrative:   Katherine LauthClara P [place] is an 82 y.o. female past medical history chronic kidney C stage 3-4, essential hypertension that comes to the ED complaining of shortness of breath and weight gain with lower extremity edema, she reports diet indiscretion for the last 2 or 3 weeks, she had her diuretic discontinued 2 weeks prior to admission. I have consulted renal for concern for dialysis needs  in the near future.  Assessment/Plan:   Acute respiratory resp. failure with hypoxia due to Acute diastolic CHF (congestive heart failure) (HCC)/chronic kidney disease stage 3-4/Cardiorenal syndrome: She is still significant fluid overloaded, with lower extremity edema positive JVD. Difficult situation as her renal function will continues to deteriorate. Now on lasix and metolazone. With poor UOP. Appreciate renal's assistance, transfer to [facility], consult IR for HD placement. PT rec. SNF.  DM (diabetes mellitus) (HCC) uncontrolled: Discontinue her long-acting insulin due to her worsening renal function, continue sliding scale insulin.  A1c is 9.0  Essential  HTN (hypertension) Good control, will continue clonidine and BiDil.  Dyslipidemia: Continue statins.  Morbid obesity (HCC): - Advise to follow low calorie diet and to increase physical activity - Body mass index is 48.68 kg/(m^2   DVT prophylaxis: heparin order Family Communication:none Disposition Plan: unable to determine Code Status:     Code Status Orders        Start     Ordered   09/25/15 1501  Full code   Continuous     09/25/15 1500    Code Status History    Date Active Date Inactive Code Status Order ID Comments User Context   02/11/2014  2:04 PM 02/14/2014  7:28 PM Full Code [id number]  Eddie NorthNishant Dhungel, MD Inpatient   01/20/2014  4:22 PM 01/25/2014   6:54 PM Full Code [id number]  Loanne DrillingFrank Aluisio V, MD Inpatient        IV Access:    Peripheral IV   Procedures and diagnostic studies:   No results found.   Medical Consultants:    None.  Anti-Infectives:   None  Subjective:    Katherine [name] she relates SOb is mildly improved. Nervous about the future.  Objective:    Filed Vitals:   09/28/15 1525 09/28/15 2032 09/29/15 0500 09/29/15 0509  BP: 127/56 123/53  134/54  Pulse: 57 58  61  Temp: 98 F (36.7 C) 97.9 F (36.6 C)  98.2 F (36.8 C)  TempSrc: Oral Oral  Oral  Resp: 20 18  18   Height:      Weight:   129.6 kg (285 lb 11.5 oz)   SpO2: 100% 100%  100%    Intake/Output Summary (Last 24 hours) at 09/29/15 0813 Last data filed at 09/29/15 0617  Gross per 24 hour  Intake    612 ml  Output   1050 ml  Net   -438 ml   Filed Weights   09/27/15 0418 09/28/15 0457 09/29/15 0500  Weight: 128.2 kg (282 lb 10.1 oz) 129.8 kg (286 lb 2.5 oz) 129.6 kg (285 lb 11.5 oz)    Exam: Gen:  NAD Cardiovascular:  RRR, +JVD Chest and lungs:   Good air movement with crackles on the right,. Abdomen:  Abdomen soft, NT/ND, + BS Extremities:  No C/E/C, some erythema which is chronic Psyq: appropriate.   Data Reviewed:  Labs: Basic Metabolic Panel:  Recent Labs Lab 09/25/15 1223 09/26/15 0527 09/27/15 0515 09/28/15 0450 09/29/15 0433  NA 143 149* 147* 143 145  K 3.7 3.5 3.6 3.9 4.0  CL 110 113* 111 107 107  CO2 GLUCOSE 216* 106* 108* 128* 133*  BUN 37* 36* 40* 48* 56*  CREATININE 1.95* 2.01* 2.58* 3.23* 3.47*  CALCIUM 8.9 9.0 8.7* 8.4* 8.7*  PHOS  --   --   --  3.8 4.4   GFR Estimated Creatinine Clearance: 17.6 mL/min (by C-G formula based on Cr of 3.47). Liver Function Tests:  Recent Labs Lab 09/25/15 1223 09/28/15 0450 09/29/15 0433  AST 21  --   --   ALT 17  --   --   ALKPHOS 121  --   --   BILITOT 0.7  --   --   PROT 7.3  --   --   ALBUMIN 3.3* 2.7* 2.7*   No results for  input(s): LIPASE, AMYLASE in the last 168 hours. No results for input(s): AMMONIA in the last 168 hours. Coagulation profile No results for input(s): INR, PROTIME in the last 168 hours.  CBC:  Recent Labs Lab 09/25/15 1223 09/27/15 0515 09/28/15 0450 09/29/15 0433  WBC 11.2* 11.0* 9.8 8.6  NEUTROABS 9.3*  --   --   --   HGB 10.2* 8.9* 8.4* 8.4*  HCT 31.1* 26.7* 25.5* 25.7*  MCV 76.8* 75.9* 76.6* 77.9*  PLT 212 198 166 205   Cardiac Enzymes:  Recent Labs Lab 09/25/15 1223  TROPONINI <0.03   BNP (last 3 results) No results for input(s): PROBNP in the last 8760 hours. CBG:  Recent Labs Lab 09/27/15 2236 09/28/15 0738 09/28/15 1630 09/28/15 2040 09/29/15 0746  GLUCAP 186* 107* 162* 183* 129*   D-Dimer: No results for input(s): DDIMER in the last 72 hours. Hgb A1c:  Recent Labs  09/27/15 0515  HGBA1C 9.0*   Lipid Profile: No results for input(s): CHOL, HDL, LDLCALC, TRIG, CHOLHDL, LDLDIRECT in the last 72 hours. Thyroid function studies: No results for input(s): TSH, T4TOTAL, T3FREE, THYROIDAB in the last 72 hours.  Invalid input(s): FREET3 Anemia work up:  Recent Labs  09/28/15 0450  TIBC 223*  IRON 12*   Sepsis Labs:  Recent Labs Lab 09/25/15 1223 09/27/15 0515 09/28/15 0450 09/29/15 0433  WBC 11.2* 11.0* 9.8 8.6   Microbiology No results found for this or any previous visit (from the past 240 hour(s)).   Medications:   . aspirin EC  81 mg Oral Daily  . carvedilol  25 mg Oral BID WC  . cloNIDine  0.2 mg Oral q morning - 10a  . docusate sodium  200 mg Oral QHS  . furosemide  160 mg Intravenous Q8H  . heparin  5,000 Units Subcutaneous Q8H  . insulin aspart  0-9 Units Subcutaneous TID WC  . insulin aspart  3 Units Subcutaneous TID WC  . isosorbide-hydrALAZINE  1 tablet Oral TID  . metolazone  10 mg Oral Daily  . rosuvastatin  10 mg Oral q1800  . sodium chloride flush  3 mL Intravenous Q12H   Continuous Infusions:   Time spent: 15  min   LOS: 4 days   Marinda Elk  [facility] Pager [phone]  *Please refer to [email or web address], password [email or web address] to get updated schedule on who will round on this patient, as hospitalists switch teams weekly. If 7PM-7AM, please contact night-coverage at [email or web address], password [facility] for any overnight  needs.  09/29/2015, 8:13 AM

## 2015-09-30 DIAGNOSIS — J9601 Acute respiratory failure with hypoxia: Secondary | ICD-10-CM

## 2015-09-30 DIAGNOSIS — N182 Chronic kidney disease, stage 2 (mild): Secondary | ICD-10-CM

## 2015-09-30 LAB — RENAL FUNCTION PANEL
Albumin: 2.3 g/dL — ABNORMAL LOW (ref 3.5–5.0)
Anion gap: 11 (ref 5–15)
BUN: 60 mg/dL — ABNORMAL HIGH (ref 6–20)
CHLORIDE: 105 mmol/L (ref 101–111)
CO2: 26 mmol/L (ref 22–32)
CREATININE: 3.37 mg/dL — AB (ref 0.44–1.00)
Calcium: 8.5 mg/dL — ABNORMAL LOW (ref 8.9–10.3)
GFR calc non Af Amer: 12 mL/min — ABNORMAL LOW (ref >60)
GFR, EST AFRICAN AMERICAN: 14 mL/min — AB (ref >60)
Glucose, Bld: 155 mg/dL — ABNORMAL HIGH (ref 65–99)
Phosphorus: 4.8 mg/dL — ABNORMAL HIGH (ref 2.5–4.6)
Potassium: 3.8 mmol/L (ref 3.5–5.1)
Sodium: 142 mmol/L (ref 135–145)

## 2015-09-30 LAB — GLUCOSE, CAPILLARY
GLUCOSE-CAPILLARY: 159 mg/dL — AB (ref 65–99)
Glucose-Capillary: 155 mg/dL — ABNORMAL HIGH (ref 65–99)
Glucose-Capillary: 224 mg/dL — ABNORMAL HIGH (ref 65–99)
Glucose-Capillary: 155 mg/dL — ABNORMAL HIGH (ref 65–99)

## 2015-09-30 MED ORDER — METOLAZONE 5 MG PO TABS
5.0000 mg | ORAL_TABLET | Freq: Two times a day (BID) | ORAL | Status: DC
  Administered 2015-09-30 – 2015-10-03 (×6): 5 mg via ORAL
  Filled 2015-09-30 (×9): qty 1

## 2015-09-30 MED ORDER — FUROSEMIDE 10 MG/ML IJ SOLN
20.0000 mg/h | INTRAMUSCULAR | Status: DC
  Administered 2015-09-30 – 2015-10-01 (×2): 10 mg/h via INTRAVENOUS
  Administered 2015-10-02 – 2015-10-03 (×3): 20 mg/h via INTRAVENOUS
  Filled 2015-09-30 (×10): qty 25

## 2015-09-30 NOTE — Progress Notes (Signed)
[facility] PROGRESS NOTE    Progress Note  Katherine [name]  ZOX:[id number] DOB: 1934/05/01 DOA: 09/25/2015 PCP: [name], MD  Outpatient Specialists:    Brief Narrative:   Katherine [name] is an 82 y.o. female past medical history chronic kidney C stage 3-4, essential hypertension that comes to the ED complaining of shortness of breath and weight gain with lower extremity edema, she reports diet indiscretion for the last 2 or 3 weeks, she had her diuretic discontinued 2 weeks prior to admission. I have consulted renal for concern for dialysis needs  in the near future.  Assessment/Plan:   Acute respiratory resp. failure with hypoxia due to Acute diastolic CHF (congestive heart failure) (HCC)/chronic kidney disease stage 3-4/Cardiorenal syndrome: She is still significant fluid overloaded, with lower extremity edema positive JVD. Difficult situation as her renal function will continues to deteriorate.Diuresis managed by renal, currently started on Lasix drip IR consulted for temporary hemodialysis catheter placement, D/W IR, patient is in the schedule for Monday PT rec. SNF.  DM (diabetes mellitus) (HCC) uncontrolled: Discontinue her long-acting insulin due to her worsening renal function, continue sliding scale insulin.  A1c is 9.0  Essential  HTN (hypertension) Good control, will continue clonidine and BiDil.  Dyslipidemia: Continue statins.  Morbid obesity (HCC): - Advise to follow low calorie diet and to increase physical activity - Body mass index is 48.68 kg/(m^2   DVT prophylaxis: heparin order Family Communication:none Disposition Plan: unable to determine Code Status:     Code Status Orders        Start     Ordered   09/25/15 1501  Full code   Continuous     09/25/15 1500    Code Status History    Date Active Date Inactive Code Status Order ID Comments User Context   02/11/2014  2:04 PM 02/14/2014  7:28 PM Full Code [id number]  Eddie North, MD  Inpatient   01/20/2014  4:22 PM 01/25/2014  6:54 PM Full Code [id number]  Loanne Drilling, MD Inpatient        IV Access:    Peripheral IV   Procedures and diagnostic studies:   No results found.   Medical Consultants:    None.  Anti-Infectives:   None  Subjective:    Katherine [name] reports some anxiety, dyspnea im  Objective:    Filed Vitals:   09/29/15 1804 09/29/15 2016 09/30/15 0520 09/30/15 0807  BP: 138/59 142/58 139/67 158/57  Pulse: 63 66 64 65  Temp: 98 F (36.7 C) 98.2 F (36.8 C) 98.6 F (37 C) 98.8 F (37.1 C)  TempSrc: Oral Oral Oral Oral  Resp: Height:      Weight:      SpO2: 100% 100% 100% 99%    Intake/Output Summary (Last 24 hours) at 09/30/15 1145 Last data filed at 09/30/15 1021  Gross per 24 hour  Intake    438 ml  Output   1000 ml  Net   -562 ml   Filed Weights   09/28/15 0457 09/29/15 0500 09/29/15 1330  Weight: 129.8 kg (286 lb 2.5 oz) 129.6 kg (285 lb 11.5 oz) 127.9 kg (281 lb 15.5 oz)    Exam: Gen:  NAD Cardiovascular:  RRR, +JVD Chest and lungs:   Good air movement with crackles on the right,. Abdomen:  Abdomen soft, NT/ND, + BS Extremities:  No C/E/C, some erythema which is chronic Psych: appropriate.   Data Reviewed:    Labs:  Basic Metabolic Panel:  Recent Labs Lab 09/26/15 0527 09/27/15 0515 09/28/15 0450 09/29/15 0433 09/30/15 0535  NA 149* 147* 143 145 142  K 3.5 3.6 3.9 4.0 3.8  CL 113* 111 107 107 105  CO2 26 27 26 27 26   GLUCOSE 106* 108* 128* 133* 155*  BUN 36* 40* 48* 56* 60*  CREATININE 2.01* 2.58* 3.23* 3.47* 3.37*  CALCIUM 9.0 8.7* 8.4* 8.7* 8.5*  PHOS  --   --  3.8 4.4 4.8*   GFR Estimated Creatinine Clearance: 18 mL/min (by C-G formula based on Cr of 3.37). Liver Function Tests:  Recent Labs Lab 09/25/15 1223 09/28/15 0450 09/29/15 0433 09/30/15 0535  AST 21  --   --   --   ALT 17  --   --   --   ALKPHOS 121  --   --   --   BILITOT 0.7  --   --   --   PROT  7.3  --   --   --   ALBUMIN 3.3* 2.7* 2.7* 2.3*   No results for input(s): LIPASE, AMYLASE in the last 168 hours. No results for input(s): AMMONIA in the last 168 hours. Coagulation profile No results for input(s): INR, PROTIME in the last 168 hours.  CBC:  Recent Labs Lab 09/25/15 1223 09/27/15 0515 09/28/15 0450 09/29/15 0433  WBC 11.2* 11.0* 9.8 8.6  NEUTROABS 9.3*  --   --   --   HGB 10.2* 8.9* 8.4* 8.4*  HCT 31.1* 26.7* 25.5* 25.7*  MCV 76.8* 75.9* 76.6* 77.9*  PLT 212 198 166 205   Cardiac Enzymes:  Recent Labs Lab 09/25/15 1223  TROPONINI <0.03   BNP (last 3 results) No results for input(s): PROBNP in the last 8760 hours. CBG:  Recent Labs Lab 09/29/15 0746 09/29/15 1130 09/29/15 1637 09/29/15 2057 09/30/15 0745  GLUCAP 129* 149* 137* 189* 159*   D-Dimer: No results for input(s): DDIMER in the last 72 hours. Hgb A1c: No results for input(s): HGBA1C in the last 72 hours. Lipid Profile: No results for input(s): CHOL, HDL, LDLCALC, TRIG, CHOLHDL, LDLDIRECT in the last 72 hours. Thyroid function studies: No results for input(s): TSH, T4TOTAL, T3FREE, THYROIDAB in the last 72 hours.  Invalid input(s): FREET3 Anemia work up:  Recent Labs  09/28/15 0450  TIBC 223*  IRON 12*   Sepsis Labs:  Recent Labs Lab 09/25/15 1223 09/27/15 0515 09/28/15 0450 09/29/15 0433  WBC 11.2* 11.0* 9.8 8.6   Microbiology No results found for this or any previous visit (from the past 240 hour(s)).   Medications:   . aspirin EC  81 mg Oral Daily  . carvedilol  25 mg Oral BID WC  . docusate sodium  200 mg Oral QHS  . heparin  5,000 Units Subcutaneous Q8H  . insulin aspart  0-9 Units Subcutaneous TID WC  . insulin aspart  3 Units Subcutaneous TID WC  . isosorbide-hydrALAZINE  1 tablet Oral TID  . metolazone  5 mg Oral BID  . pneumococcal 23 valent vaccine  0.5 mL Intramuscular Tomorrow-1000  . rosuvastatin  10 mg Oral q1800  . sodium chloride flush  3 mL  Intravenous Q12H   Continuous Infusions: . furosemide (LASIX) infusion      Time spent: 20 min   LOS: 5 days   [facility] Facility-SacramentoELGERGAWY, DAWOOD  [facility] Pager [phone]  *Please refer to [email or web address], password [email or web address] to get updated schedule on who will round on this patient, as hospitalists switch teams weekly.  If 7PM-7AM, please contact night-coverage at [email or web address], password [email or web address] for any overnight needs.  09/30/2015, 11:45 AM

## 2015-09-30 NOTE — Progress Notes (Signed)
Patient ID: Katherine [name], female   DOB: 06-07-1933, 82 y.o.   MRN: [id number]  [facility] Progress Note    Assessment/ Plan:   1. AKI on chronic kidney disease stage IV with a solitary functioning right kidney: Chronic cardiorenal syndrome and refractory to diuretic therapy-some improvement of urinary output with addition of metolazone however not enough to constitute a successful diuresis- I will convert her over to furosemide drip 10 mg per hour. Awaiting assistance from IR for placement of temproary dialysis catheter followed by dialysis later today or tomorrow. She does not have any uremic signs or symptoms and does not have any acute electrolyte abnormality to prompt intervention. Unclear if she will need long-term dialysis at this point. 2. Acute diastolic heart failure (EF 55-60%): Unsuccessful diuretic therapy response so far, to start hemodialysis for volume unloading. 3. Anemia: Status post intravenous iron, without overt blood loss, low hemoglobin levels noted. 4. Hypertension: Blood pressures acceptable-amlodipine stopped and carvedilol/BiDil continued to allow for renal perfusion  Subjective:   She reports she slept well overnight and remains very apprehensive about dialysis and the implications on her life changes. Continues to have shortness of breath with mild exertion.    Objective:   BP 158/57 mmHg  Pulse 65  Temp(Src) 98.8 F (37.1 C) (Oral)  Resp 22  Ht 5\' 4"  (1.626 m)  Wt 127.9 kg (281 lb 15.5 oz)  BMI 48.38 kg/m2  SpO2 99%  Intake/Output Summary (Last 24 hours) at 09/30/15 0958 Last data filed at 09/30/15 0600  Gross per 24 hour  Intake    198 ml  Output   1000 ml  Net   -802 ml   Weight change: -1.7 kg (-3 lb 12 oz)  Physical Exam: Gen: Anxiously resting in bed CVS: Pulse sinus rhythm, normal rate, S1 and S2 normal  Resp: Poor inspiratory effort with bibasilar rales Abd: Soft, obese, nontender Ext: 3+ lower extremity edema  bilaterally  Imaging: No results found.  Labs: BMET  Recent Labs Lab 09/25/15 1223 09/26/15 0527 09/27/15 0515 09/28/15 0450 09/29/15 0433 09/30/15 0535  NA 143 149* 147* 143 145 142  K 3.7 3.5 3.6 3.9 4.0 3.8  CL 110 113* 111 107 107 105  CO2 23 26 27 26 27 26   GLUCOSE 216* 106* 108* 128* 133* 155*  BUN 37* 36* 40* 48* 56* 60*  CREATININE 1.95* 2.01* 2.58* 3.23* 3.47* 3.37*  CALCIUM 8.9 9.0 8.7* 8.4* 8.7* 8.5*  PHOS  --   --   --  3.8 4.4 4.8*   CBC  Recent Labs Lab 09/25/15 1223 09/27/15 0515 09/28/15 0450 09/29/15 0433  WBC 11.2* 11.0* 9.8 8.6  NEUTROABS 9.3*  --   --   --   HGB 10.2* 8.9* 8.4* 8.4*  HCT 31.1* 26.7* 25.5* 25.7*  MCV 76.8* 75.9* 76.6* 77.9*  PLT 212 198 166 205   Medications:    . aspirin EC  81 mg Oral Daily  . carvedilol  25 mg Oral BID WC  . docusate sodium  200 mg Oral QHS  . furosemide  160 mg Intravenous Q8H  . heparin  5,000 Units Subcutaneous Q8H  . insulin aspart  0-9 Units Subcutaneous TID WC  . insulin aspart  3 Units Subcutaneous TID WC  . isosorbide-hydrALAZINE  1 tablet Oral TID  . metolazone  10 mg Oral Daily  . pneumococcal 23 valent vaccine  0.5 mL Intramuscular Tomorrow-1000  . rosuvastatin  10 mg Oral q1800  . sodium chloride  flush  3 mL Intravenous Q12H   Zetta Bills, MD 09/30/2015, 9:58 AM

## 2015-10-01 LAB — CBC
HEMATOCRIT: 27.3 % — AB (ref 36.0–46.0)
Hemoglobin: 8.9 g/dL — ABNORMAL LOW (ref 12.0–15.0)
MCH: 25.7 pg — AB (ref 26.0–34.0)
MCHC: 32.6 g/dL (ref 30.0–36.0)
MCV: 78.9 fL (ref 78.0–100.0)
PLATELETS: 249 10*3/uL (ref 150–400)
RBC: 3.46 MIL/uL — AB (ref 3.87–5.11)
RDW: 17.5 % — ABNORMAL HIGH (ref 11.5–15.5)
WBC: 12.2 10*3/uL — ABNORMAL HIGH (ref 4.0–10.5)

## 2015-10-01 LAB — RENAL FUNCTION PANEL
Albumin: 2.2 g/dL — ABNORMAL LOW (ref 3.5–5.0)
Anion gap: 11 (ref 5–15)
BUN: 69 mg/dL — AB (ref 6–20)
CHLORIDE: 102 mmol/L (ref 101–111)
CO2: 28 mmol/L (ref 22–32)
CREATININE: 3.64 mg/dL — AB (ref 0.44–1.00)
Calcium: 8.3 mg/dL — ABNORMAL LOW (ref 8.9–10.3)
GFR calc non Af Amer: 11 mL/min — ABNORMAL LOW (ref >60)
GFR, EST AFRICAN AMERICAN: 13 mL/min — AB (ref >60)
Glucose, Bld: 171 mg/dL — ABNORMAL HIGH (ref 65–99)
POTASSIUM: 3.5 mmol/L (ref 3.5–5.1)
Phosphorus: 4.9 mg/dL — ABNORMAL HIGH (ref 2.5–4.6)
Sodium: 141 mmol/L (ref 135–145)

## 2015-10-01 LAB — GLUCOSE, CAPILLARY
GLUCOSE-CAPILLARY: 226 mg/dL — AB (ref 65–99)
GLUCOSE-CAPILLARY: 163 mg/dL — AB (ref 65–99)
Glucose-Capillary: 143 mg/dL — ABNORMAL HIGH (ref 65–99)
Glucose-Capillary: 172 mg/dL — ABNORMAL HIGH (ref 65–99)

## 2015-10-01 MED ORDER — SODIUM CHLORIDE 0.9 % IV SOLN
100.0000 mL | INTRAVENOUS | Status: DC | PRN
Start: 2015-10-01 — End: 2015-10-02

## 2015-10-01 MED ORDER — SODIUM CHLORIDE 0.9 % IV SOLN: 100.0000 mL | INTRAVENOUS | Status: DC | PRN

## 2015-10-01 MED ORDER — LIDOCAINE HCL (PF) 1 % IJ SOLN: 5.0000 mL | INTRAMUSCULAR | Status: DC | PRN

## 2015-10-01 MED ORDER — PENTAFLUOROPROP-TETRAFLUOROETH EX AERO: 1.0000 "application " | INHALATION_SPRAY | CUTANEOUS | Status: DC | PRN

## 2015-10-01 MED ORDER — ALTEPLASE 2 MG IJ SOLR: 2.0000 mg | Freq: Once | INTRAMUSCULAR | Status: DC | PRN

## 2015-10-01 MED ORDER — LIDOCAINE-PRILOCAINE 2.5-2.5 % EX CREA: 1.0000 "application " | TOPICAL_CREAM | CUTANEOUS | Status: DC | PRN

## 2015-10-01 MED ORDER — HEPARIN SODIUM (PORCINE) 1000 UNIT/ML DIALYSIS: 1000.0000 [IU] | INTRAMUSCULAR | Status: DC | PRN

## 2015-10-01 MED ORDER — HEPARIN SODIUM (PORCINE) 5000 UNIT/ML IJ SOLN
5000.0000 [IU] | Freq: Three times a day (TID) | INTRAMUSCULAR | Status: DC
  Administered 2015-10-02 – 2015-10-14 (×30): 5000 [IU] via SUBCUTANEOUS
  Filled 2015-10-01 (×27): qty 1

## 2015-10-01 MED ORDER — ESCITALOPRAM OXALATE 10 MG PO TABS
5.0000 mg | ORAL_TABLET | Freq: Every day | ORAL | Status: DC
  Administered 2015-10-01 – 2015-10-14 (×13): 5 mg via ORAL
  Filled 2015-10-01 (×14): qty 1

## 2015-10-01 NOTE — Progress Notes (Signed)
[facility] PROGRESS NOTE    Progress Note  Katherine LauthClara P [name]  UJW:[id number] DOB: 16-May-1934 DOA: 09/25/2015 PCP: Katherine SackGREEN, EDWIN JAY, MD  Outpatient Specialists:    Brief Narrative:   Katherine [name] is an 82 y.o. female past medical history chronic kidney C stage 3-4, essential hypertension that comes to the ED complaining of shortness of breath and weight gain with lower extremity edema, she reports diet indiscretion for the last 2 or 3 weeks, she had her diuretic discontinued 2 weeks prior to admission. I have consulted renal for concern for dialysis needs  in the near future.  Assessment/Plan:   Acute respiratory resp. failure with hypoxia due to Acute diastolic CHF (congestive heart failure) (HCC)/chronic kidney disease stage 3-4/Cardiorenal syndrome: - She is still significant fluid overloaded, with lower extremity edema positive JVD. - Difficult situation as her renal function will continues to deteriorate.Diuresis managed by renal, Still significantly overloaded, with worsening renal function, with no good urine output with Lasix drip and Aldactone . - IR consulted for temporary hemodialysis catheter placement, D/W IR, patient is in the schedule for Monday, or possible need of hemodialysis. - PT rec. SNF.  DM (diabetes mellitus) (HCC) uncontrolled: - Discontinue her long-acting insulin due to her worsening renal function, continue sliding scale insulin.  A1c is 9.0  Essential  HTN (hypertension) - Susceptible, off amlodipine, continue with Coreg and BiDil. Recommendation Dyslipidemia: Continue statins.  Morbid obesity (HCC): - Advise to follow low calorie diet and to increase physical activity - Body mass index is 48.68 kg/(m^2  Anemia - Anemia of chronic kidney disease, status post IV iron  Depression - We'll start on low-dose Lexapro  DVT prophylaxis: heparin order Family Communication:none Disposition Plan: unable to determine Code Status:     Code Status  Orders        Start     Ordered   09/25/15 1501  Full code   Continuous     09/25/15 1500    Code Status History    Date Active Date Inactive Code Status Order ID Comments User Context   02/11/2014  2:04 PM 02/14/2014  7:28 PM Full Code [id number]  Katherine NorthNishant Dhungel, MD Inpatient   01/20/2014  4:22 PM 01/25/2014  6:54 PM Full Code [id number]  Katherine DrillingFrank Aluisio V, MD Inpatient        IV Access:    Peripheral IV   Procedures and diagnostic studies:   No results found.   Medical Consultants:    renal  Anti-Infectives:   None  Subjective:    Katherine [name] report some mild nausea and vomiting.  Objective:    Filed Vitals:   09/30/15 1746 09/30/15 2142 10/01/15 0554 10/01/15 0900  BP: 150/67 115/48 128/57 131/87  Pulse: 73 72 69 91  Temp: 99.3 F (37.4 C) 101.1 F (38.4 C) 98.6 F (37 C) 100.2 F (37.9 C)  TempSrc: Oral Oral Oral Oral  Resp: 22 18 19 22   Height:      Weight:   126.8 kg (279 lb 8.7 oz)   SpO2: 99% 94% 97% 100%    Intake/Output Summary (Last 24 hours) at 10/01/15 1319 Last data filed at 10/01/15 1016  Gross per 24 hour  Intake   1031 ml  Output   1050 ml  Net    -19 ml   Filed Weights   09/29/15 0500 09/29/15 1330 10/01/15 0554  Weight: 129.6 kg (285 lb 11.5 oz) 127.9 kg (281 lb 15.5 oz) 126.8 kg (279 lb 8.7 oz)  Exam: Gen:  NAD Cardiovascular:  RRR, +JVD Chest and lungs:   Good air movement with crackles on the right,. Abdomen:  Abdomen soft, NT/ND, + BS Extremities:  No C/E/C, some erythema which is chronic Psych: appropriate, appears to be in a depressed mood   Data Reviewed:    Labs: Basic Metabolic Panel:  Recent Labs Lab 09/27/15 0515 09/28/15 0450 09/29/15 0433 09/30/15 0535 10/01/15 0535  NA 147* 143 145 142 141  K 3.6 3.9 4.0 3.8 3.5  CL 111 107 107 105 102  CO2 GLUCOSE 108* 128* 133* 155* 171*  BUN 40* 48* 56* 60* 69*  CREATININE 2.58* 3.23* 3.47* 3.37* 3.64*  CALCIUM 8.7* 8.4* 8.7* 8.5*  8.3*  PHOS  --  3.8 4.4 4.8* 4.9*   GFR Estimated Creatinine Clearance: 16.5 mL/min (by C-G formula based on Cr of 3.64). Liver Function Tests:  Recent Labs Lab 09/25/15 1223 09/28/15 0450 09/29/15 0433 09/30/15 0535 10/01/15 0535  AST 21  --   --   --   --   ALT 17  --   --   --   --   ALKPHOS 121  --   --   --   --   BILITOT 0.7  --   --   --   --   PROT 7.3  --   --   --   --   ALBUMIN 3.3* 2.7* 2.7* 2.3* 2.2*   No results for input(s): LIPASE, AMYLASE in the last 168 hours. No results for input(s): AMMONIA in the last 168 hours. Coagulation profile No results for input(s): INR, PROTIME in the last 168 hours.  CBC:  Recent Labs Lab 09/25/15 1223 09/27/15 0515 09/28/15 0450 09/29/15 0433 10/01/15 0838  WBC 11.2* 11.0* 9.8 8.6 12.2*  NEUTROABS 9.3*  --   --   --   --   HGB 10.2* 8.9* 8.4* 8.4* 8.9*  HCT 31.1* 26.7* 25.5* 25.7* 27.3*  MCV 76.8* 75.9* 76.6* 77.9* 78.9  PLT 212 198 166 205 249   Cardiac Enzymes:  Recent Labs Lab 09/25/15 1223  TROPONINI <0.03   BNP (last 3 results) No results for input(s): PROBNP in the last 8760 hours. CBG:  Recent Labs Lab 09/30/15 1202 09/30/15 1658 09/30/15 2147 10/01/15 0807 10/01/15 1207  GLUCAP 155* 224* 155* 143* 226*   D-Dimer: No results for input(s): DDIMER in the last 72 hours. Hgb A1c: No results for input(s): HGBA1C in the last 72 hours. Lipid Profile: No results for input(s): CHOL, HDL, LDLCALC, TRIG, CHOLHDL, LDLDIRECT in the last 72 hours. Thyroid function studies: No results for input(s): TSH, T4TOTAL, T3FREE, THYROIDAB in the last 72 hours.  Invalid input(s): FREET3 Anemia work up: No results for input(s): VITAMINB12, FOLATE, FERRITIN, TIBC, IRON, RETICCTPCT in the last 72 hours. Sepsis Labs:  Recent Labs Lab 09/27/15 0515 09/28/15 0450 09/29/15 0433 10/01/15 0838  WBC 11.0* 9.8 8.6 12.2*   Microbiology No results found for this or any previous visit (from the past 240  hour(s)).   Medications:   . aspirin EC  81 mg Oral Daily  . carvedilol  25 mg Oral BID WC  . docusate sodium  200 mg Oral QHS  . [START ON 10/02/2015] heparin  5,000 Units Subcutaneous Q8H  . insulin aspart  0-9 Units Subcutaneous TID WC  . insulin aspart  3 Units Subcutaneous TID WC  . isosorbide-hydrALAZINE  1 tablet Oral TID  . metolazone  5 mg Oral  BID  . pneumococcal 23 valent vaccine  0.5 mL Intramuscular Tomorrow-1000  . rosuvastatin  10 mg Oral q1800  . sodium chloride flush  3 mL Intravenous Q12H   Continuous Infusions: . furosemide (LASIX) infusion 20 mg/hr (10/01/15 1055)    Time spent: 20 min   LOS: 6 days   [facility], Kalene Cutler  [facility] Pager [phone]  *Please refer to [email or web address], password [email or web address] to get updated schedule on who will round on this patient, as hospitalists switch teams weekly. If 7PM-7AM, please contact night-coverage at [email or web address], password [email or web address] for any overnight needs.  10/01/2015, 1:19 PM

## 2015-10-01 NOTE — Progress Notes (Signed)
Patient ID: Katherine [name], female   DOB: [birth year], 82 y.o.   MRN: [id number]  [facility] Progress Note    Assessment/ Plan:   1. AKI on chronic kidney disease stage IV with a solitary functioning right kidney: Chronic cardiorenal syndrome and remains refractory to diuretic therapy- she has worsening renal function with unimpressive urine output on high-dose multi target diuretics. I will order for hemodialysis/ultrafiltration to begin tomorrow-she will likely require this daily for extensive volume unloading while limiting episodes of hypotension in order not to delay renal recovery. She has baseline chronic kidney disease and a solitary kidney-unclear if she will need long-term dialysis at this point. She is very apprehensive about chronic hemodialysis given her limited social support network and her desire not to burden her daughter with this responsibility. 2. Acute diastolic heart failure (EF 55-60%): Unimpressive response to diuretic therapy so far-will escalate Lasix drip dose to 20 mg per hour for the next 24 hours and initiate hemodialysis tomorrow. 3. Anemia: Status post intravenous iron, without overt blood loss, low hemoglobin levels noted. 4. Hypertension: Blood pressures acceptable-amlodipine stopped and carvedilol/BiDil continued to allow for renal perfusion  Subjective:   She reports some restlessness overnight and this morning had nausea and vomiting-in poor spirits. Still awaiting hemodialysis catheter placement by IR (this was ordered on Friday by Dr. Robb Matarrtiz [facility]    Objective:   BP 128/57 mmHg  Pulse 69  Temp(Src) 98.6 F (37 C) (Oral)  Resp 19  Ht 5\' 4"  (1.626 m)  Wt 126.8 kg (279 lb 8.7 oz)  BMI 47.96 kg/m2  SpO2 97%  Intake/Output Summary (Last 24 hours) at 10/01/15 0939 Last data filed at 10/01/15 0630  Gross per 24 hour  Intake   1031 ml  Output   1325 ml  Net   -294 ml   Weight change: -1.1 kg (-2 lb 6.8 oz)  Physical  Exam: Gen: Resting uncomfortably in bed CVS: Pulse sinus rhythm, normal rate, S1 and S2 normal  Resp: Poor inspiratory effort with bibasilar rales Abd: Soft, obese, nontender Ext: 3+ lower extremity edema bilaterally  Imaging: No results found.  Labs: BMET  Recent Labs Lab 09/25/15 1223 09/26/15 0527 09/27/15 0515 09/28/15 0450 09/29/15 0433 09/30/15 0535 10/01/15 0535  NA 143 149* 147* 143 145 142 141  K 3.7 3.5 3.6 3.9 4.0 3.8 3.5  CL 110 113* 111 107 107 105 102  CO2 23 26 27 26 27 26 28   GLUCOSE 216* 106* 108* 128* 133* 155* 171*  BUN 37* 36* 40* 48* 56* 60* 69*  CREATININE 1.95* 2.01* 2.58* 3.23* 3.47* 3.37* 3.64*  CALCIUM 8.9 9.0 8.7* 8.4* 8.7* 8.5* 8.3*  PHOS  --   --   --  3.8 4.4 4.8* 4.9*   CBC  Recent Labs Lab 09/25/15 1223 09/27/15 0515 09/28/15 0450 09/29/15 0433 10/01/15 0838  WBC 11.2* 11.0* 9.8 8.6 12.2*  NEUTROABS 9.3*  --   --   --   --   HGB 10.2* 8.9* 8.4* 8.4* 8.9*  HCT 31.1* 26.7* 25.5* 25.7* 27.3*  MCV 76.8* 75.9* 76.6* 77.9* 78.9  PLT 212 198 166 205 249   Medications:    . aspirin EC  81 mg Oral Daily  . carvedilol  25 mg Oral BID WC  . docusate sodium  200 mg Oral QHS  . heparin  5,000 Units Subcutaneous Q8H  . insulin aspart  0-9 Units Subcutaneous TID WC  . insulin aspart  3  Units Subcutaneous TID WC  . isosorbide-hydrALAZINE  1 tablet Oral TID  . metolazone  5 mg Oral BID  . pneumococcal 23 valent vaccine  0.5 mL Intramuscular Tomorrow-1000  . rosuvastatin  10 mg Oral q1800  . sodium chloride flush  3 mL Intravenous Q12H   Zetta Bills, MD 10/01/2015, 9:39 AM

## 2015-10-02 ENCOUNTER — Inpatient Hospital Stay (HOSPITAL_COMMUNITY): Payer: Medicare Other

## 2015-10-02 LAB — RENAL FUNCTION PANEL
Albumin: 2.1 g/dL — ABNORMAL LOW (ref 3.5–5.0)
Anion gap: 14 (ref 5–15)
BUN: 82 mg/dL — AB (ref 6–20)
CHLORIDE: 100 mmol/L — AB (ref 101–111)
CO2: 27 mmol/L (ref 22–32)
Calcium: 8.5 mg/dL — ABNORMAL LOW (ref 8.9–10.3)
Creatinine, Ser: 3.88 mg/dL — ABNORMAL HIGH (ref 0.44–1.00)
GFR calc Af Amer: 12 mL/min — ABNORMAL LOW (ref >60)
GFR calc non Af Amer: 10 mL/min — ABNORMAL LOW (ref >60)
GLUCOSE: 247 mg/dL — AB (ref 65–99)
POTASSIUM: 3.4 mmol/L — AB (ref 3.5–5.1)
Phosphorus: 5.3 mg/dL — ABNORMAL HIGH (ref 2.5–4.6)
Sodium: 141 mmol/L (ref 135–145)

## 2015-10-02 LAB — HEPATITIS B SURFACE ANTIGEN
Hepatitis B Surface Ag: NEGATIVE
Hepatitis B Surface Ag: NEGATIVE

## 2015-10-02 LAB — GLUCOSE, CAPILLARY
GLUCOSE-CAPILLARY: 230 mg/dL — AB (ref 65–99)
Glucose-Capillary: 221 mg/dL — ABNORMAL HIGH (ref 65–99)
Glucose-Capillary: 282 mg/dL — ABNORMAL HIGH (ref 65–99)

## 2015-10-02 LAB — HEPATITIS B SURFACE ANTIBODY,QUALITATIVE: HEP B S AB: NONREACTIVE

## 2015-10-02 MED ORDER — LIDOCAINE HCL 1 % IJ SOLN
INTRAMUSCULAR | Status: AC
  Filled 2015-10-02: qty 20

## 2015-10-02 MED ORDER — HEPARIN SODIUM (PORCINE) 1000 UNIT/ML IJ SOLN
INTRAMUSCULAR | Status: AC
  Filled 2015-10-02: qty 1

## 2015-10-02 NOTE — Consult Note (Signed)
   [facility]   10/02/2015  Katherine LauthClara P [name] July 17, 1933 [id number] Patient screened for potential [facility] Care Management services. Patient is eligible for [facility]. Electronic medical record reveals patient's discharge plan is for skilled nursing facility and there were no identifiable [facility] needs at this time. [facility] Care [facility] services not appropriate at this time. If patient's post hospital needs change please place a [facility] consult. For questions please contact:   Charlesetta ShanksVictoria Cregg Jutte, RN BSN CCM [facility]  [phone] business mobile phone Toll free office [phone]

## 2015-10-02 NOTE — Progress Notes (Signed)
Patient ID: Katherine [name], female   DOB: 1934-02-20, 82 y.o.   MRN: [id number]  [facility] Progress Note    Assessment/ Plan:   1. AKI on chronic kidney disease stage IV with a solitary functioning right kidney: Chronic cardiorenal syndrome and remains refractory to diuretics. Uremic symptoms w nausea and severe fatigue. Started on HD for now w temp HD cath. She has baseline chronic kidney disease and a solitary kidney-unclear if she will need long-term dialysis at this point. She is very apprehensive about chronic hemodialysis given her limited social support network and her desire not to burden her daughter with this responsibility. 2. Acute diastolic heart failure: repeat CXR, cont diuretics 3. Anemia: Status post intravenous iron, without overt blood loss, low hemoglobin levels noted. 4. Hypertension: BP's soft, dc BP meds for now 5. DM on insulin  Plan - HD again tomorrow, hold BP meds   Kelly Splinter MD [place] pager [phone]    cell [phone] 10/02/2015, 3:36 PM    Subjective:   Severe nausea and very tired.  Had first HD this am.  No new complaints. 2.5 L UOP yest on IV lasix drip. Wt's down 127kg. BP's soft   Objective:   BP 110/41 mmHg  Pulse 71  Temp(Src) 99.3 F (37.4 C) (Oral)  Resp 18  Ht _0  (1.626 m)  Wt 126.8 kg (279 lb 8.7 oz)  BMI 47.96 kg/m2  SpO2 96%  Intake/Output Summary (Last 24 hours) at 10/02/15 1531 Last data filed at 10/02/15 1426  Gross per 24 hour  Intake  867.5 ml  Output   3730 ml  Net -2862.5 ml   Weight change:   Physical Exam: Gen: Resting uncomfortably in bed CVS: Pulse sinus rhythm, normal rate, S1 and S2 normal  Resp: Poor inspiratory effort with bibasilar rales Abd: Soft, obese, nontender Ext: 1-2+ lower extremity edema bilaterally  CXR 4/24 bilat effusions layering, vasc congestion, c/w chf   Imaging: Ir Fluoro Guide Cv Line Right  10/02/2015  INDICATION: 82 year old female with a  history of chronic kidney disease, stage III, requiring hemodialysis. EXAM: TUNNELED CENTRAL VENOUS HEMODIALYSIS CATHETER PLACEMENT WITH ULTRASOUND AND FLUOROSCOPIC GUIDANCE MEDICATIONS: None ANESTHESIA/SEDATION: None FLUOROSCOPY TIME:  Fluoroscopy Time: 0 minutes 12 seconds (1.4 mGy). COMPLICATIONS: None PROCEDURE: Informed written consent was obtained from the patient after a discussion of the risks, benefits, and alternatives to treatment. Questions regarding the procedure were encouraged and answered. The right neck and chest were prepped with chlorhexidine in a sterile fashion, and a sterile drape was applied covering the operative field. Maximum barrier sterile technique with sterile gowns and gloves were used for the procedure. A timeout was performed prior to the initiation of the procedure. Ultrasound image of the right neck was performed with images stored and sent to PACs. The skin and subcutaneous tissues overlying the right internal jugular vein were generously infiltrated 1% lidocaine for local anesthesia. A micropuncture kit was used access the right internal jugular vein under ultrasound guidance. Micro wire was advanced into the vein, confirmed under fluoroscopy. 035 guidewire was advanced through the 4 French sheath into the right heart. Dilation of the soft tissues was achieved. Trialysis catheter was then placed over the guidewire into the right heart. Wire was removed and the ports were aspirated and flushed. Catheter was sutured in position. Patient tolerated the procedure well and remained hemodynamically stable throughout. No complications were encountered and no significant blood loss encountered. IMPRESSION: Status post right IJ temporary hemodialysis catheter  placement. Catheter ready for use. Catheter puncture was performed low to optimize conversion of this catheter if a tunneled solution is required. Signed, Dulcy Fanny. Earleen Newport, DO [facility]  [facility] Radiology Electronically Signed   By: Corrie Mckusick D.O.   On: 10/02/2015 09:35   Ir US Guide Vasc Access Right  10/02/2015  INDICATION: 82 year old female with a history of chronic kidney disease, stage III, requiring hemodialysis. EXAM: TUNNELED CENTRAL VENOUS HEMODIALYSIS CATHETER PLACEMENT WITH ULTRASOUND AND FLUOROSCOPIC GUIDANCE MEDICATIONS: None ANESTHESIA/SEDATION: None FLUOROSCOPY TIME:  Fluoroscopy Time: 0 minutes 12 seconds (1.4 mGy). COMPLICATIONS: None PROCEDURE: Informed written consent was obtained from the patient after a discussion of the risks, benefits, and alternatives to treatment. Questions regarding the procedure were encouraged and answered. The right neck and chest were prepped with chlorhexidine in a sterile fashion, and a sterile drape was applied covering the operative field. Maximum barrier sterile technique with sterile gowns and gloves were used for the procedure. A timeout was performed prior to the initiation of the procedure. Ultrasound image of the right neck was performed with images stored and sent to PACs. The skin and subcutaneous tissues overlying the right internal jugular vein were generously infiltrated 1% lidocaine for local anesthesia. A micropuncture kit was used access the right internal jugular vein under ultrasound guidance. Micro wire was advanced into the vein, confirmed under fluoroscopy. 035 guidewire was advanced through the 4 French sheath into the right heart. Dilation of the soft tissues was achieved. Trialysis catheter was then placed over the guidewire into the right heart. Wire was removed and the ports were aspirated and flushed. Catheter was sutured in position. Patient tolerated the procedure well and remained hemodynamically stable throughout. No complications were encountered and no significant blood loss encountered. IMPRESSION: Status post right IJ temporary hemodialysis catheter placement. Catheter ready for use. Catheter puncture was  performed low to optimize conversion of this catheter if a tunneled solution is required. Signed, Dulcy Fanny. Earleen Newport, DO [facility]iology Electronically Signed   By: Corrie Mckusick D.O.   On: 10/02/2015 09:35    Labs: BMET  Recent Labs Lab 09/26/15 0527 09/27/15 0515 09/28/15 0450 09/29/15 0433 09/30/15 0535 10/01/15 0535 10/02/15 0535  NA 149* 147* 143 145 142 141 141  K 3.5 3.6 3.9 4.0 3.8 3.5 3.4*  CL 113* 111 107 107 105 102 100*  CO2 _0 GLUCOSE 106* 108* 128* 133* 155* 171* 247*  BUN 36* 40* 48* 56* 60* 69* 82*  CREATININE 2.01* 2.58* 3.23* 3.47* 3.37* 3.64* 3.88*  CALCIUM 9.0 8.7* 8.4* 8.7* 8.5* 8.3* 8.5*  PHOS  --   --  3.8 4.4 4.8* 4.9* 5.3*   CBC  Recent Labs Lab 09/27/15 0515 09/28/15 0450 09/29/15 0433 10/01/15 0838  WBC 11.0* 9.8 8.6 12.2*  HGB 8.9* 8.4* 8.4* 8.9*  HCT 26.7* 25.5* 25.7* 27.3*  MCV 75.9* 76.6* 77.9* 78.9  PLT 198 166 205 249   Medications:    . aspirin EC  81 mg Oral Daily  . carvedilol  25 mg Oral BID WC  . docusate sodium  200 mg Oral QHS  . escitalopram  5 mg Oral Daily  . heparin      . heparin  5,000 Units Subcutaneous Q8H  . insulin aspart  0-9 Units Subcutaneous TID WC  . insulin aspart  3 Units Subcutaneous TID WC  . isosorbide-hydrALAZINE  1 tablet Oral TID  . lidocaine      .  metolazone  5 mg Oral BID  . pneumococcal 23 valent vaccine  0.5 mL Intramuscular Tomorrow-1000  . rosuvastatin  10 mg Oral q1800  . sodium chloride flush  3 mL Intravenous Q12H

## 2015-10-02 NOTE — Progress Notes (Signed)
Patient was able to get-up from her bed with two person assist,took a few steps with a rolling walker toward the recliner chairs and was sitting there from 1630 till 1930.

## 2015-10-02 NOTE — Procedures (Signed)
Interventional Radiology Procedure Note  Procedure: Placement of a right IJ approach temporary HD catheter.  Low puncture can be converted.   Tip is positioned at the superior cavoatrial junction and catheter is ready for immediate use.  Complications: None Recommendations:  - Ok to use catheter - Do not submerge  - Routine line care   Signed,  Yvone NeuJaime S. Loreta AveWagner, DO

## 2015-10-02 NOTE — Progress Notes (Addendum)
PT Cancellation Note  Patient Details Name: Katherine [name] MRN: [id number] DOB: 1933/11/04   Cancelled Treatment:    Reason Eval/Treat Not Completed: Patient at procedure or test/unavailable. Pt in IR for temporary HD catheter placement. Pt to undergo HD following catheter placement.   Ilda FoilGarrow, Aiyla Baucom Rene 10/02/2015, 8:48 AM

## 2015-10-02 NOTE — Progress Notes (Signed)
[facility] PROGRESS NOTE    Progress Note  Katherine [name]  MRN:[id number] DOB: 01/04/1934 DOA: 09/25/2015 PCP: GREEN, EDWIN JAY, MD  Outpatient Specialists:    Brief Narrative:   Katherine [name] is an 81 y.o. female past medical history chronic kidney C stage 3-4, essential hypertension that comes to the ED complaining of shortness of breath and weight gain with lower extremity edema, she reports diet indiscretion for the last 2 or 3 weeks, she had her diuretic discontinued 2 weeks prior to admission. renal been consulted  for concern for dialysis needs  in the near future, Should continue to have poor response and worsening renal function on significant IV diuresis, a temporary dialysis catheter inserted 5/1, and start dialysis on 5/1  Assessment/Plan:   Acute respiratory resp. failure with hypoxia due to Acute diastolic CHF (congestive heart failure) (HCC)/chronic kidney disease stage 3-4/Cardiorenal syndrome: - She is still significant fluid overloaded, with lower extremity edema positive JVD. - Difficult situation as her renal function will continues to deteriorate.Diuresis managed by renal, Still significantly overloaded, with worsening renal function, with no good urine output with Lasix drip and Aldactone . - IRPlaced temporary right IJ  hemodialysis catheter placement 5/1 - Management per nephrology, started dialysis on 10/02/2015  DM (diabetes mellitus) (HCC) uncontrolled: - Discontinue her long-acting insulin due to her worsening renal function, continue sliding scale insulin.  A1c is 9.0  Essential  HTN (hypertension) - Susceptible, off amlodipine, continue with Coreg and BiDil. Per renal Recommendation   Dyslipidemia: Continue statins.  Morbid obesity (HCC): - Advise to follow low calorie diet and to increase physical activity - Body mass index is 48.68 kg/(m^2  Anemia - Anemia of chronic kidney disease, status post IV iron  Depression - started on low-dose  Lexapro  DVT prophylaxis: heparin order Family Communication:none Disposition Plan: unable to determine Code Status:     Code Status Orders        Start     Ordered   09/25/15 1501  Full code   Continuous     09/25/15 1500    Code Status History    Date Active Date Inactive Code Status Order ID Comments User Context   02/11/2014  2:04 PM 02/14/2014  7:28 PM Full Code [id number]  Nishant Dhungel, MD Inpatient   01/20/2014  4:22 PM 01/25/2014  6:54 PM Full Code [id number]  Frank Aluisio V, MD Inpatient        IV Access:    Peripheral IV   Procedures and diagnostic studies:   Ir Fluoro Guide Cv Line Right  10/02/2015  INDICATION: 81-year-old female with a history of chronic kidney disease, stage III, requiring hemodialysis. EXAM: TUNNELED CENTRAL VENOUS HEMODIALYSIS CATHETER PLACEMENT WITH ULTRASOUND AND FLUOROSCOPIC GUIDANCE MEDICATIONS: None ANESTHESIA/SEDATION: None FLUOROSCOPY TIME:  Fluoroscopy Time: 0 minutes 12 seconds (1.4 mGy). COMPLICATIONS: None PROCEDURE: Informed written consent was obtained from the patient after a discussion of the risks, benefits, and alternatives to treatment. Questions regarding the procedure were encouraged and answered. The right neck and chest were prepped with chlorhexidine in a sterile fashion, and a sterile drape was applied covering the operative field. Maximum barrier sterile technique with sterile gowns and gloves were used for the procedure. A timeout was performed prior to the initiation of the procedure. Ultrasound image of the right neck was performed with images stored and sent to PACs. The skin and subcutaneous tissues overlying the right internal jugular vein were generously infiltrated 1% lidocaine for local anesthesia. A micropuncture kit   was used access the right internal jugular vein under ultrasound guidance. Micro wire was advanced into the vein, confirmed under fluoroscopy. 035 guidewire was advanced through the 4 French sheath into  the right heart. Dilation of the soft tissues was achieved. Trialysis catheter was then placed over the guidewire into the right heart. Wire was removed and the ports were aspirated and flushed. Catheter was sutured in position. Patient tolerated the procedure well and remained hemodynamically stable throughout. No complications were encountered and no significant blood loss encountered. IMPRESSION: Status post right IJ temporary hemodialysis catheter placement. Catheter ready for use. Catheter puncture was performed low to optimize conversion of this catheter if a tunneled solution is required. Signed, Jaime S. Wagner, DO [facility] Electronically Signed   By: Jaime  Wagner D.O.   On: 10/02/2015 09:35   Ir Us Guide Vasc Access Right  10/02/2015  INDICATION: 81-year-old female with a history of chronic kidney disease, stage III, requiring hemodialysis. EXAM: TUNNELED CENTRAL VENOUS HEMODIALYSIS CATHETER PLACEMENT WITH ULTRASOUND AND FLUOROSCOPIC GUIDANCE MEDICATIONS: None ANESTHESIA/SEDATION: None FLUOROSCOPY TIME:  Fluoroscopy Time: 0 minutes 12 seconds (1.4 mGy). COMPLICATIONS: None PROCEDURE: Informed written consent was obtained from the patient after a discussion of the risks, benefits, and alternatives to treatment. Questions regarding the procedure were encouraged and answered. The right neck and chest were prepped with chlorhexidine in a sterile fashion, and a sterile drape was applied covering the operative field. Maximum barrier sterile technique with sterile gowns and gloves were used for the procedure. A timeout was performed prior to the initiation of the procedure. Ultrasound image of the right neck was performed with images stored and sent to PACs. The skin and subcutaneous tissues overlying the right internal jugular vein were generously infiltrated 1% lidocaine for local anesthesia. A micropuncture kit was used access the right internal  jugular vein under ultrasound guidance. Micro wire was advanced into the vein, confirmed under fluoroscopy. 035 guidewire was advanced through the 4 French sheath into the right heart. Dilation of the soft tissues was achieved. Trialysis catheter was then placed over the guidewire into the right heart. Wire was removed and the ports were aspirated and flushed. Catheter was sutured in position. Patient tolerated the procedure well and remained hemodynamically stable throughout. No complications were encountered and no significant blood loss encountered. IMPRESSION: Status post right IJ temporary hemodialysis catheter placement. Catheter ready for use. Catheter puncture was performed low to optimize conversion of this catheter if a tunneled solution is required. Signed, Jaime S. Wagner, DO [facility] [place] [facility] Electronically Signed   By: Jaime  Wagner D.O.   On: 10/02/2015 09:35     Medical Consultants:    Renal  IR  Anti-Infectives:   None  Subjective:    Katherine [name] report some mild nausea and vomiting.  Objective:    Filed Vitals:   10/02/15 0652 10/02/15 0713 10/02/15 1152 10/02/15 1200  BP: 114/38  96/39 100/40  Pulse: 84  80 80  Temp: 101.5 F (38.6 C) 99.8 F (37.7 C)    TempSrc: Oral Oral    Resp: 20  20 19  Height:      Weight:      SpO2: 96%       Intake/Output Summary (Last 24 hours) at 10/02/15 1415 Last data filed at 10/02/15 0653  Gross per 24 hour  Intake  987.5 ml  Output   2050 ml  Net -1062.5 ml   Filed Weights     09/29/15 0500 09/29/15 1330 10/01/15 0554  Weight: 129.6 kg (285 lb 11.5 oz) 127.9 kg (281 lb 15.5 oz) 126.8 kg (279 lb 8.7 oz)    Exam: Gen:  NAD, sleepy, seen during hemodialysis Cardiovascular:  RRR, +JVD Chest and lungs:   Good air movement with crackles on the right,. Abdomen:  Abdomen soft, NT/ND, + BS Extremities:  No C/E/C, some erythema which is chronic Psych: appropriate,  appears to be in a depressed mood   Data Reviewed:    Labs: Basic Metabolic Panel:  Recent Labs Lab 09/28/15 0450 09/29/15 0433 09/30/15 0535 10/01/15 0535 10/02/15 0535  NA 143 145 142 141 141  K 3.9 4.0 3.8 3.5 3.4*  CL 107 107 105 102 100*  CO2 26 27 26 28 27  GLUCOSE 128* 133* 155* 171* 247*  BUN 48* 56* 60* 69* 82*  CREATININE 3.23* 3.47* 3.37* 3.64* 3.88*  CALCIUM 8.4* 8.7* 8.5* 8.3* 8.5*  PHOS 3.8 4.4 4.8* 4.9* 5.3*   GFR Estimated Creatinine Clearance: 15.5 mL/min (by C-G formula based on Cr of 3.88). Liver Function Tests:  Recent Labs Lab 09/28/15 0450 09/29/15 0433 09/30/15 0535 10/01/15 0535 10/02/15 0535  ALBUMIN 2.7* 2.7* 2.3* 2.2* 2.1*   No results for input(s): LIPASE, AMYLASE in the last 168 hours. No results for input(s): AMMONIA in the last 168 hours. Coagulation profile No results for input(s): INR, PROTIME in the last 168 hours.  CBC:  Recent Labs Lab 09/27/15 0515 09/28/15 0450 09/29/15 0433 10/01/15 0838  WBC 11.0* 9.8 8.6 12.2*  HGB 8.9* 8.4* 8.4* 8.9*  HCT 26.7* 25.5* 25.7* 27.3*  MCV 75.9* 76.6* 77.9* 78.9  PLT 198 166 205 249   Cardiac Enzymes: No results for input(s): CKTOTAL, CKMB, CKMBINDEX, TROPONINI in the last 168 hours. BNP (last 3 results) No results for input(s): PROBNP in the last 8760 hours. CBG:  Recent Labs Lab 10/01/15 1207 10/01/15 1607 10/01/15 2116 10/02/15 0728 10/02/15 1128  GLUCAP 226* 172* 163* 230* 221*   D-Dimer: No results for input(s): DDIMER in the last 72 hours. Hgb A1c: No results for input(s): HGBA1C in the last 72 hours. Lipid Profile: No results for input(s): CHOL, HDL, LDLCALC, TRIG, CHOLHDL, LDLDIRECT in the last 72 hours. Thyroid function studies: No results for input(s): TSH, T4TOTAL, T3FREE, THYROIDAB in the last 72 hours.  Invalid input(s): FREET3 Anemia work up: No results for input(s): VITAMINB12, FOLATE, FERRITIN, TIBC, IRON, RETICCTPCT in the last 72 hours. Sepsis  Labs:  Recent Labs Lab 09/27/15 0515 09/28/15 0450 09/29/15 0433 10/01/15 0838  WBC 11.0* 9.8 8.6 12.2*   Microbiology No results found for this or any previous visit (from the past 240 hour(s)).   Medications:   . aspirin EC  81 mg Oral Daily  . carvedilol  25 mg Oral BID WC  . docusate sodium  200 mg Oral QHS  . escitalopram  5 mg Oral Daily  . heparin      . heparin  5,000 Units Subcutaneous Q8H  . insulin aspart  0-9 Units Subcutaneous TID WC  . insulin aspart  3 Units Subcutaneous TID WC  . isosorbide-hydrALAZINE  1 tablet Oral TID  . lidocaine      . metolazone  5 mg Oral BID  . pneumococcal 23 valent vaccine  0.5 mL Intramuscular Tomorrow-1000  . rosuvastatin  10 mg Oral q1800  . sodium chloride flush  3 mL Intravenous Q12H   Continuous Infusions: . furosemide (LASIX) infusion 20 mg/hr (10/01/15 1055)      Time spent: 25 min   LOS: 7 days   ELGERGAWY, DAWOOD  [facility] Pager [phone]  *Please refer to [email or web address], password [email or web address] to get updated schedule on who will round on this patient, as hospitalists switch teams weekly. If 7PM-7AM, please contact night-coverage at [email or web address], password [email or web address] for any overnight needs.  10/02/2015, 2:15 PM         

## 2015-10-03 LAB — CBC
HCT: 27.2 % — ABNORMAL LOW (ref 36.0–46.0)
Hemoglobin: 8.9 g/dL — ABNORMAL LOW (ref 12.0–15.0)
MCH: 25.1 pg — ABNORMAL LOW (ref 26.0–34.0)
MCHC: 32.7 g/dL (ref 30.0–36.0)
MCV: 76.6 fL — ABNORMAL LOW (ref 78.0–100.0)
Platelets: 177 10*3/uL (ref 150–400)
RBC: 3.55 MIL/uL — ABNORMAL LOW (ref 3.87–5.11)
RDW: 17.2 % — AB (ref 11.5–15.5)
WBC: 11.7 10*3/uL — AB (ref 4.0–10.5)

## 2015-10-03 LAB — HEPATITIS B SURFACE ANTIBODY,QUALITATIVE: HEP B S AB: NONREACTIVE

## 2015-10-03 LAB — RENAL FUNCTION PANEL
ALBUMIN: 2.2 g/dL — AB (ref 3.5–5.0)
ANION GAP: 14 (ref 5–15)
BUN: 67 mg/dL — AB (ref 6–20)
CALCIUM: 8.8 mg/dL — AB (ref 8.9–10.3)
CHLORIDE: 98 mmol/L — AB (ref 101–111)
CO2: 25 mmol/L (ref 22–32)
Creatinine, Ser: 3.88 mg/dL — ABNORMAL HIGH (ref 0.44–1.00)
GFR calc Af Amer: 12 mL/min — ABNORMAL LOW (ref >60)
GFR calc non Af Amer: 10 mL/min — ABNORMAL LOW (ref >60)
GLUCOSE: 256 mg/dL — AB (ref 65–99)
PHOSPHORUS: 4.8 mg/dL — AB (ref 2.5–4.6)
POTASSIUM: 4 mmol/L (ref 3.5–5.1)
Sodium: 137 mmol/L (ref 135–145)

## 2015-10-03 LAB — GLUCOSE, CAPILLARY
GLUCOSE-CAPILLARY: 241 mg/dL — AB (ref 65–99)
GLUCOSE-CAPILLARY: 251 mg/dL — AB (ref 65–99)
GLUCOSE-CAPILLARY: 299 mg/dL — AB (ref 65–99)
Glucose-Capillary: 202 mg/dL — ABNORMAL HIGH (ref 65–99)
Glucose-Capillary: 217 mg/dL — ABNORMAL HIGH (ref 65–99)

## 2015-10-03 LAB — HEPATITIS B CORE ANTIBODY, TOTAL: Hep B Core Total Ab: NEGATIVE

## 2015-10-03 MED ORDER — HEPARIN SODIUM (PORCINE) 1000 UNIT/ML DIALYSIS: 2000.0000 [IU] | INTRAMUSCULAR | Status: DC | PRN

## 2015-10-03 MED ORDER — ONDANSETRON HCL 4 MG/2ML IJ SOLN
4.0000 mg | Freq: Once | INTRAMUSCULAR | Status: AC
  Administered 2015-10-03: 4 mg via INTRAVENOUS

## 2015-10-03 MED ORDER — HEPARIN SODIUM (PORCINE) 1000 UNIT/ML DIALYSIS: 3000.0000 [IU] | INTRAMUSCULAR | Status: DC | PRN

## 2015-10-03 MED ORDER — LIDOCAINE HCL (PF) 1 % IJ SOLN: 5.0000 mL | INTRAMUSCULAR | Status: DC | PRN

## 2015-10-03 MED ORDER — INSULIN GLARGINE 100 UNIT/ML ~~LOC~~ SOLN
4.0000 [IU] | Freq: Once | SUBCUTANEOUS | Status: AC
  Administered 2015-10-03: 4 [IU] via SUBCUTANEOUS
  Filled 2015-10-03: qty 0.04

## 2015-10-03 MED ORDER — HEPARIN SODIUM (PORCINE) 1000 UNIT/ML DIALYSIS: 1000.0000 [IU] | INTRAMUSCULAR | Status: DC | PRN

## 2015-10-03 MED ORDER — LIDOCAINE-PRILOCAINE 2.5-2.5 % EX CREA: 1.0000 "application " | TOPICAL_CREAM | CUTANEOUS | Status: DC | PRN

## 2015-10-03 MED ORDER — ALTEPLASE 2 MG IJ SOLR: 2.0000 mg | Freq: Once | INTRAMUSCULAR | Status: DC | PRN

## 2015-10-03 MED ORDER — ONDANSETRON HCL 4 MG/2ML IJ SOLN
INTRAMUSCULAR | Status: AC
  Filled 2015-10-03: qty 2

## 2015-10-03 MED ORDER — SODIUM CHLORIDE 0.9 % IV SOLN: 100.0000 mL | INTRAVENOUS | Status: DC | PRN

## 2015-10-03 MED ORDER — INSULIN GLARGINE 100 UNIT/ML ~~LOC~~ SOLN
8.0000 [IU] | Freq: Every day | SUBCUTANEOUS | Status: DC
  Administered 2015-10-04 – 2015-10-14 (×10): 8 [IU] via SUBCUTANEOUS
  Filled 2015-10-03 (×11): qty 0.08

## 2015-10-03 MED ORDER — INSULIN GLARGINE 100 UNIT/ML ~~LOC~~ SOLN
4.0000 [IU] | Freq: Every day | SUBCUTANEOUS | Status: DC
  Administered 2015-10-03: 4 [IU] via SUBCUTANEOUS
  Filled 2015-10-03: qty 0.04

## 2015-10-03 MED ORDER — PENTAFLUOROPROP-TETRAFLUOROETH EX AERO: 1.0000 "application " | INHALATION_SPRAY | CUTANEOUS | Status: DC | PRN

## 2015-10-03 NOTE — Progress Notes (Signed)
Inpatient Diabetes Program Recommendations  AACE/ADA: New Consensus Statement on Inpatient Glycemic Control (2015)  Target Ranges:  Prepandial:   less than 140 mg/dL      Peak postprandial:   less than 180 mg/dL (1-2 hours)      Critically ill patients:  140 - 180 mg/dL   Results for Katherine [name], Baylen P (MRN [id number]) as of 10/03/2015 08:51  Ref. Range 10/02/2015 07:28 10/02/2015 11:28 10/02/2015 16:31 10/02/2015 21:17  Glucose-Capillary Latest Ref Range: 65-99 mg/dL 409230 (H) 811221 (H) 914282 (H) 241 (H)   Review of Glycemic Control  Diabetes history: DM 2 Outpatient Diabetes medications: Lantus 25 units, Amaryl 6 mg Daily, Januvia 100 mg Daily Current orders for Inpatient glycemic control: Lantus 4 units Daily, Novolog Sensitive + Novolog 3 units TID meal coverage  Inpatient Diabetes Program Recommendations: Insulin - Basal: Glucose mostly in the 200's. Patient takes Lantus 25 units Daily. Please increase basal insulin to at least Lantus 12 units (0.1units/kg).  Thanks,  Christena DeemShannon Ettie Krontz RN, MSN, [facility] Inpatient Diabetes Coordinator Team Pager [phone] (8a-5p)

## 2015-10-03 NOTE — Progress Notes (Signed)
[facility] PROGRESS NOTE    Progress Note  Katherine [name]  QZR:[id number] DOB: 1933/08/22 DOA: 09/25/2015 PCP: Criselda Peaches, MD  Outpatient Specialists:    Brief Narrative:   Katherine [name] is an 82 y.o. female past medical history chronic kidney C stage 3-4, essential hypertension that comes to the ED complaining of shortness of breath and weight gain with lower extremity edema, she reports diet indiscretion for the last 2 or 3 weeks, she had her diuretic discontinued 2 weeks prior to admission. renal been consulted  for concern for dialysis needs  in the near future, Should continue to have poor response and worsening renal function on significant IV diuresis, a temporary dialysis catheter inserted 5/1, and started dialysis on 5/1  Assessment/Plan:   Acute respiratory resp. failure with hypoxia  - due toVolume overload from Acute diastolic CHF (congestive heart failure) (HCC)/chronic kidney disease stage 3-4/Cardiorenal syndrome - Difficult situation as her renal function will continues to deteriorate, with minimal diuresis on large dose of IV diuretics . - Continue with oxygen, wean as tolerated  AKI on chronic kidney disease stage IV with a solitary functioning renal kidney - IRPlaced temporary right IJ  hemodialysis catheter placement 5/1 - Management per nephrology, started dialysis on 10/02/2015  DM (diabetes mellitus) ([place]) uncontrolled: - On insulin sliding scale, and NovoLog 3 units before meals , Ms. uncontrolled, will increase her Lantus from 4-8  - A1c is 9.0  Essential  HTN (hypertension) - Susceptible, off amlodipine, continue with Coreg and BiDil. Per renal Recommendation   Dyslipidemia: Continue statins.  Morbid obesity ([place]): - Advise to follow low calorie diet and to increase physical activity - Body mass index is 48.68 kg/(m^2  Anemia - Anemia of chronic kidney disease, status post IV iron  Depression  - started on low-dose Lexapro  DVT  prophylaxis: heparin order Family Communication:none Disposition Plan: unable to determine Code Status:     Code Status Orders        Start     Ordered   09/25/15 1501  Full code   Continuous     09/25/15 1500    Code Status History    Date Active Date Inactive Code Status Order ID Comments User Context   02/11/2014  2:04 PM 02/14/2014  7:28 PM Full Code [id number]  Louellen Molder, MD Inpatient   01/20/2014  4:22 PM 01/25/2014  6:54 PM Full Code [id number]  Gearlean Alf, MD Inpatient        IV Access:    Peripheral IV   Procedures and diagnostic studies:   Ir Fluoro Guide Cv Line Right  10/02/2015  INDICATION: 82 year old female with a history of chronic kidney disease, stage III, requiring hemodialysis. EXAM: TUNNELED CENTRAL VENOUS HEMODIALYSIS CATHETER PLACEMENT WITH ULTRASOUND AND FLUOROSCOPIC GUIDANCE MEDICATIONS: None ANESTHESIA/SEDATION: None FLUOROSCOPY TIME:  Fluoroscopy Time: 0 minutes 12 seconds (1.4 mGy). COMPLICATIONS: None PROCEDURE: Informed written consent was obtained from the patient after a discussion of the risks, benefits, and alternatives to treatment. Questions regarding the procedure were encouraged and answered. The right neck and chest were prepped with chlorhexidine in a sterile fashion, and a sterile drape was applied covering the operative field. Maximum barrier sterile technique with sterile gowns and gloves were used for the procedure. A timeout was performed prior to the initiation of the procedure. Ultrasound image of the right neck was performed with images stored and sent to PACs. The skin and subcutaneous tissues overlying the right internal jugular vein were generously infiltrated  1% lidocaine for local anesthesia. A micropuncture kit was used access the right internal jugular vein under ultrasound guidance. Micro wire was advanced into the vein, confirmed under fluoroscopy. 035 guidewire was advanced through the 4 French sheath into the right heart.  Dilation of the soft tissues was achieved. Trialysis catheter was then placed over the guidewire into the right heart. Wire was removed and the ports were aspirated and flushed. Catheter was sutured in position. Patient tolerated the procedure well and remained hemodynamically stable throughout. No complications were encountered and no significant blood loss encountered. IMPRESSION: Status post right IJ temporary hemodialysis catheter placement. Catheter ready for use. Catheter puncture was performed low to optimize conversion of this catheter if a tunneled solution is required. Signed, Dulcy Fanny. Earleen Newport, DO [facility] Radiology Electronically Signed   By: Corrie Mckusick D.O.   On: 10/02/2015 09:35   Ir US Guide Vasc Access Right  10/02/2015  INDICATION: 82 year old female with a history of chronic kidney disease, stage III, requiring hemodialysis. EXAM: TUNNELED CENTRAL VENOUS HEMODIALYSIS CATHETER PLACEMENT WITH ULTRASOUND AND FLUOROSCOPIC GUIDANCE MEDICATIONS: None ANESTHESIA/SEDATION: None FLUOROSCOPY TIME:  Fluoroscopy Time: 0 minutes 12 seconds (1.4 mGy). COMPLICATIONS: None PROCEDURE: Informed written consent was obtained from the patient after a discussion of the risks, benefits, and alternatives to treatment. Questions regarding the procedure were encouraged and answered. The right neck and chest were prepped with chlorhexidine in a sterile fashion, and a sterile drape was applied covering the operative field. Maximum barrier sterile technique with sterile gowns and gloves were used for the procedure. A timeout was performed prior to the initiation of the procedure. Ultrasound image of the right neck was performed with images stored and sent to PACs. The skin and subcutaneous tissues overlying the right internal jugular vein were generously infiltrated 1% lidocaine for local anesthesia. A micropuncture kit was used access the right internal jugular vein under  ultrasound guidance. Micro wire was advanced into the vein, confirmed under fluoroscopy. 035 guidewire was advanced through the 4 French sheath into the right heart. Dilation of the soft tissues was achieved. Trialysis catheter was then placed over the guidewire into the right heart. Wire was removed and the ports were aspirated and flushed. Catheter was sutured in position. Patient tolerated the procedure well and remained hemodynamically stable throughout. No complications were encountered and no significant blood loss encountered. IMPRESSION: Status post right IJ temporary hemodialysis catheter placement. Catheter ready for use. Catheter puncture was performed low to optimize conversion of this catheter if a tunneled solution is required. Signed, Dulcy Fanny. Earleen Newport, DO [facility] Radiology Electronically Signed   By: Corrie Mckusick D.O.   On: 10/02/2015 09:35     Medical Consultants:    Renal  IR  Anti-Infectives:   None  Subjective:    Katherine [name] report some mild nausea and vomiting.  Objective:    Filed Vitals:   10/03/15 0843 10/03/15 1255 10/03/15 1305 10/03/15 1310  BP: 107/46 139/57 148/58 147/63  Pulse: 73 68 66 64  Temp: 99.1 F (37.3 C) 98.4 F (36.9 C)    TempSrc: Oral Oral    Resp: 17 15 15 21   Height:      Weight:      SpO2: 95% 96% 97% 96%    Intake/Output Summary (Last 24 hours) at 10/03/15 1402 Last data filed at 10/03/15 1103  Gross per 24 hour  Intake 462.33 ml  Output   1905 ml  Net -1442.67 ml   Filed Weights   10/01/15 0554 10/02/15 1426 10/02/15 2105  Weight: 126.8 kg (279 lb 8.7 oz) 127 kg (279 lb 15.8 oz) 123.1 kg (271 lb 6.2 oz)    Exam: Gen:  NAD,Sleeping in bed, no apparent distress Cardiovascular:  RRR, +JVD Chest and lungs:   Good air movement with crackles on the right,. Abdomen:  Abdomen soft, NT/ND, + BS Extremities:  No C/E/C, some erythema which is chronic Psych: appropriate,  appears to be in a depressed mood   Data Reviewed:    Labs: Basic Metabolic Panel:  Recent Labs Lab 09/29/15 0433 09/30/15 0535 10/01/15 0535 10/02/15 0535 10/03/15 0515  NA 145 142 141 141 137  K 4.0 3.8 3.5 3.4* 4.0  CL 107 105 102 100* 98*  CO2 27 26 28 27 25   GLUCOSE 133* 155* 171* 247* 256*  BUN 56* 60* 69* 82* 67*  CREATININE 3.47* 3.37* 3.64* 3.88* 3.88*  CALCIUM 8.7* 8.5* 8.3* 8.5* 8.8*  PHOS 4.4 4.8* 4.9* 5.3* 4.8*   GFR Estimated Creatinine Clearance: 15.2 mL/min (by C-G formula based on Cr of 3.88). Liver Function Tests:  Recent Labs Lab 09/29/15 0433 09/30/15 0535 10/01/15 0535 10/02/15 0535 10/03/15 0515  ALBUMIN 2.7* 2.3* 2.2* 2.1* 2.2*   No results for input(s): LIPASE, AMYLASE in the last 168 hours. No results for input(s): AMMONIA in the last 168 hours. Coagulation profile No results for input(s): INR, PROTIME in the last 168 hours.  CBC:  Recent Labs Lab 09/27/15 0515 09/28/15 0450 09/29/15 0433 10/01/15 0838 10/03/15 0515  WBC 11.0* 9.8 8.6 12.2* 11.7*  HGB 8.9* 8.4* 8.4* 8.9* 8.9*  HCT 26.7* 25.5* 25.7* 27.3* 27.2*  MCV 75.9* 76.6* 77.9* 78.9 76.6*  PLT 198 166 205 249 177   Cardiac Enzymes: No results for input(s): CKTOTAL, CKMB, CKMBINDEX, TROPONINI in the last 168 hours. BNP (last 3 results) No results for input(s): PROBNP in the last 8760 hours. CBG:  Recent Labs Lab 10/02/15 1128 10/02/15 1631 10/02/15 2117 10/03/15 0839 10/03/15 1204  GLUCAP 221* 282* 241* 251* 299*   D-Dimer: No results for input(s): DDIMER in the last 72 hours. Hgb A1c: No results for input(s): HGBA1C in the last 72 hours. Lipid Profile: No results for input(s): CHOL, HDL, LDLCALC, TRIG, CHOLHDL, LDLDIRECT in the last 72 hours. Thyroid function studies: No results for input(s): TSH, T4TOTAL, T3FREE, THYROIDAB in the last 72 hours.  Invalid input(s): FREET3 Anemia work up: No results for input(s): VITAMINB12, FOLATE, FERRITIN, TIBC, IRON,  RETICCTPCT in the last 72 hours. Sepsis Labs:  Recent Labs Lab 09/28/15 0450 09/29/15 0433 10/01/15 0838 10/03/15 0515  WBC 9.8 8.6 12.2* 11.7*   Microbiology No results found for this or any previous visit (from the past 240 hour(s)).   Medications:   . aspirin EC  81 mg Oral Daily  . docusate sodium  200 mg Oral QHS  . escitalopram  5 mg Oral Daily  . heparin  5,000 Units Subcutaneous Q8H  . insulin aspart  0-9 Units Subcutaneous TID WC  . insulin aspart  3 Units Subcutaneous TID WC  . insulin glargine  4 Units Subcutaneous Daily  . metolazone  5 mg Oral BID  . pneumococcal 23 valent vaccine  0.5 mL Intramuscular Tomorrow-1000  . rosuvastatin  10 mg Oral q1800  . sodium chloride flush  3 mL Intravenous Q12H   Continuous Infusions: . furosemide (LASIX) infusion 20 mg/hr (10/03/15 0318)    Time spent: 25 min  LOS: 8 days   [facility], Katherine [name]  [facility] Pager [phone]  *Please refer to [email or web address], password [email or web address] to get updated schedule on who will round on this patient, as hospitalists switch teams weekly. If 7PM-7AM, please contact night-coverage at [email or web address], password [email or web address] for any overnight needs.  10/03/2015, 2:02 PM

## 2015-10-03 NOTE — Progress Notes (Addendum)
Patient ID: Katherine [name], female   DOB: Jul 27, 1933, 82 y.o.   MRN: [id number]  [facility] Progress Note    Assessment/ Plan:   1. AKI on chronic kidney disease stage IV with a solitary functioning right kidney: Chronic cardiorenal syndrome and remains refractory to diuretics. Uremic symptoms w nausea and severe fatigue. Still making urine on diuretics but volume has dropped off since starting HD Started on HD for now w temp HD cath. She has baseline chronic kidney disease and a solitary kidney-unclear if she will need long-term dialysis at this point. She is very apprehensive about chronic hemodialysis given her limited social support network and her desire not to burden her daughter with this responsibility. She may have difficulty with OP dialysis given her mobility as well.   2. Acute diastolic heart failure: repeat CXR, cont diuretics 3. Anemia: Status post intravenous iron, without overt blood loss, low hemoglobin levels noted. 4. Hypertension: BP's soft, dc BP meds for now 5. DM on insulin 6. Nausea - severity seems out of proportion to what would be expected from uremia.  Consider other causes.   Plan - HD again today.  HD tomorrow. DC diuretics.   Kelly Splinter MD [facility] pager [phone]    cell [phone] 10/03/2015, 2:29 PM    Subjective:   Severe nausea still.  Had HD yesterday.     Objective:   BP 109/47 mmHg  Pulse 63  Temp(Src) 98.4 F (36.9 C) (Oral)  Resp 20  Ht 5' 4"  (1.626 m)  Wt 123.1 kg (271 lb 6.2 oz)  BMI 46.56 kg/m2  SpO2 97%  Intake/Output Summary (Last 24 hours) at 10/03/15 1429 Last data filed at 10/03/15 1103  Gross per 24 hour  Intake 462.33 ml  Output    225 ml  Net 237.33 ml   Weight change:   Physical Exam: Gen: Resting uncomfortably in bed CVS: Pulse sinus rhythm, normal rate, S1 and S2 normal  Resp: Poor inspiratory effort with bibasilar rales Abd: Soft, obese, nontender Ext: 1-2+ lower extremity  edema bilaterally  CXR 4/24 bilat effusions layering, vasc congestion, c/w chf   Imaging: Ir Fluoro Guide Cv Line Right  10/02/2015  INDICATION: 82 year old female with a history of chronic kidney disease, stage III, requiring hemodialysis. EXAM: TUNNELED CENTRAL VENOUS HEMODIALYSIS CATHETER PLACEMENT WITH ULTRASOUND AND FLUOROSCOPIC GUIDANCE MEDICATIONS: None ANESTHESIA/SEDATION: None FLUOROSCOPY TIME:  Fluoroscopy Time: 0 minutes 12 seconds (1.4 mGy). COMPLICATIONS: None PROCEDURE: Informed written consent was obtained from the patient after a discussion of the risks, benefits, and alternatives to treatment. Questions regarding the procedure were encouraged and answered. The right neck and chest were prepped with chlorhexidine in a sterile fashion, and a sterile drape was applied covering the operative field. Maximum barrier sterile technique with sterile gowns and gloves were used for the procedure. A timeout was performed prior to the initiation of the procedure. Ultrasound image of the right neck was performed with images stored and sent to PACs. The skin and subcutaneous tissues overlying the right internal jugular vein were generously infiltrated 1% lidocaine for local anesthesia. A micropuncture kit was used access the right internal jugular vein under ultrasound guidance. Micro wire was advanced into the vein, confirmed under fluoroscopy. 035 guidewire was advanced through the 4 French sheath into the right heart. Dilation of the soft tissues was achieved. Trialysis catheter was then placed over the guidewire into the right heart. Wire was removed and the ports were aspirated and flushed. Catheter was sutured  in position. Patient tolerated the procedure well and remained hemodynamically stable throughout. No complications were encountered and no significant blood loss encountered. IMPRESSION: Status post right IJ temporary hemodialysis catheter placement. Catheter ready for use. Catheter puncture was  performed low to optimize conversion of this catheter if a tunneled solution is required. Signed, Dulcy Fanny. Earleen Newport, DO [facility] Radiology Electronically Signed   By: Corrie Mckusick D.O.   On: 10/02/2015 09:35   Ir US Guide Vasc Access Right  10/02/2015  INDICATION: 82 year old female with a history of chronic kidney disease, stage III, requiring hemodialysis. EXAM: TUNNELED CENTRAL VENOUS HEMODIALYSIS CATHETER PLACEMENT WITH ULTRASOUND AND FLUOROSCOPIC GUIDANCE MEDICATIONS: None ANESTHESIA/SEDATION: None FLUOROSCOPY TIME:  Fluoroscopy Time: 0 minutes 12 seconds (1.4 mGy). COMPLICATIONS: None PROCEDURE: Informed written consent was obtained from the patient after a discussion of the risks, benefits, and alternatives to treatment. Questions regarding the procedure were encouraged and answered. The right neck and chest were prepped with chlorhexidine in a sterile fashion, and a sterile drape was applied covering the operative field. Maximum barrier sterile technique with sterile gowns and gloves were used for the procedure. A timeout was performed prior to the initiation of the procedure. Ultrasound image of the right neck was performed with images stored and sent to PACs. The skin and subcutaneous tissues overlying the right internal jugular vein were generously infiltrated 1% lidocaine for local anesthesia. A micropuncture kit was used access the right internal jugular vein under ultrasound guidance. Micro wire was advanced into the vein, confirmed under fluoroscopy. 035 guidewire was advanced through the 4 French sheath into the right heart. Dilation of the soft tissues was achieved. Trialysis catheter was then placed over the guidewire into the right heart. Wire was removed and the ports were aspirated and flushed. Catheter was sutured in position. Patient tolerated the procedure well and remained hemodynamically stable throughout. No complications were  encountered and no significant blood loss encountered. IMPRESSION: Status post right IJ temporary hemodialysis catheter placement. Catheter ready for use. Catheter puncture was performed low to optimize conversion of this catheter if a tunneled solution is required. Signed, Dulcy Fanny. Earleen Newport, DO [facility] Radiology Electronically Signed   By: Corrie Mckusick D.O.   On: 10/02/2015 09:35    Labs: BMET  Recent Labs Lab 09/27/15 0515 09/28/15 0450 09/29/15 0433 09/30/15 0535 10/01/15 0535 10/02/15 0535 10/03/15 0515  NA 147* 143 145 142 141 141 137  K 3.6 3.9 4.0 3.8 3.5 3.4* 4.0  CL 111 107 107 105 102 100* 98*  CO2 27 26 27 26 28 27 25   GLUCOSE 108* 128* 133* 155* 171* 247* 256*  BUN 40* 48* 56* 60* 69* 82* 67*  CREATININE 2.58* 3.23* 3.47* 3.37* 3.64* 3.88* 3.88*  CALCIUM 8.7* 8.4* 8.7* 8.5* 8.3* 8.5* 8.8*  PHOS  --  3.8 4.4 4.8* 4.9* 5.3* 4.8*   CBC  Recent Labs Lab 09/28/15 0450 09/29/15 0433 10/01/15 0838 10/03/15 0515  WBC 9.8 8.6 12.2* 11.7*  HGB 8.4* 8.4* 8.9* 8.9*  HCT 25.5* 25.7* 27.3* 27.2*  MCV 76.6* 77.9* 78.9 76.6*  PLT 166 205 249 177   Medications:    . aspirin EC  81 mg Oral Daily  . docusate sodium  200 mg Oral QHS  . escitalopram  5 mg Oral Daily  . heparin  5,000 Units Subcutaneous Q8H  . insulin aspart  0-9 Units Subcutaneous TID WC  . insulin aspart  3 Units Subcutaneous TID  WC  . insulin glargine  4 Units Subcutaneous Once  . [START ON 10/04/2015] insulin glargine  8 Units Subcutaneous Daily  . metolazone  5 mg Oral BID  . pneumococcal 23 valent vaccine  0.5 mL Intramuscular Tomorrow-1000  . rosuvastatin  10 mg Oral q1800  . sodium chloride flush  3 mL Intravenous Q12H

## 2015-10-03 NOTE — Progress Notes (Signed)
Physical Therapy Treatment Patient Details Name: Katherine LauthClara P Lipps MRN: [id number] DOB: 05/16/1934 Today's Date: 10/03/2015    History of Present Illness 82 y.o. female with h/o B TKA, HTN, DM, CKD, CHF admitted with fall, increased BLE edema, weakness, SOB. Dx of pulmonary edema.     PT Comments    Pt with c/o N&V. She would only speak in a soft whisper. She appeared disinterested in participating in therapy and presented with self-limiting behavior. Pt encouraged to participate and educated on importance of mobility. She is scheduled to undergo HD again today per nephrology PN.  Follow Up Recommendations  SNF;Supervision/Assistance - 24 hour     Equipment Recommendations  Wheelchair (measurements PT)    Recommendations for Other Services       Precautions / Restrictions Precautions Precautions: Fall Precaution Comments: pt fell just PTA, she denies other falls in the past year    Mobility  Bed Mobility   Bed Mobility: Rolling Rolling: Min assist   Supine to sit: Mod assist;HOB elevated Sit to supine: Mod assist   General bed mobility comments: +rail, verbal cues for sequencing  Transfers   Equipment used: Rolling walker (2 wheeled)   Sit to Stand: +2 physical assistance;Mod assist         General transfer comment: verbal cues for hand placement, assist to power up  Ambulation/Gait Ambulation/Gait assistance: +2 safety/equipment;Mod assist Ambulation Distance (Feet): 3 Feet Assistive device: Rolling walker (2 wheeled) Gait Pattern/deviations: Shuffle   Gait velocity interpretation: Below normal speed for age/gender General Gait Details: sidestepping toward the left for better position toward HOB. Pt tends to lean on elbows on RW.   Stairs            Wheelchair Mobility    Modified Rankin (Stroke Patients Only)       Balance   Sitting-balance support: No upper extremity supported;Feet supported Sitting balance-Leahy Scale: Good     Standing  balance support: Bilateral upper extremity supported;During functional activity Standing balance-Leahy Scale: Poor                      Cognition Arousal/Alertness: Awake/alert Behavior During Therapy: WFL for tasks assessed/performed Overall Cognitive Status: Within Functional Limits for tasks assessed                      Exercises      General Comments        Pertinent Vitals/Pain Pain Assessment: No/denies pain    Home Living                      Prior Function            PT Goals (current goals can now be found in the care plan section) Acute Rehab PT Goals Patient Stated Goal: to be strong enough to go home PT Goal Formulation: With patient Time For Goal Achievement: 10/10/15 Potential to Achieve Goals: Good Progress towards PT goals: Progressing toward goals    Frequency  Min 3X/week    PT Plan Current plan remains appropriate    Co-evaluation             End of Session Equipment Utilized During Treatment: Gait belt Activity Tolerance: Patient limited by fatigue;Treatment limited secondary to medical complications (Comment) (N&V) Patient left: in bed;with call bell/phone within reach;with bed alarm set     Time: [id number] PT Time Calculation (min) (ACUTE ONLY): 40 min  Charges:  $Gait Training: 8-22 mins $Therapeutic  Activity: 23-37 mins                    G Codes:      Ilda Foil 10/03/2015, 10:39 AM

## 2015-10-04 ENCOUNTER — Inpatient Hospital Stay (HOSPITAL_COMMUNITY): Payer: Medicare Other

## 2015-10-04 LAB — RENAL FUNCTION PANEL
ALBUMIN: 2 g/dL — AB (ref 3.5–5.0)
Anion gap: 13 (ref 5–15)
BUN: 52 mg/dL — ABNORMAL HIGH (ref 6–20)
CALCIUM: 8.9 mg/dL (ref 8.9–10.3)
CO2: 25 mmol/L (ref 22–32)
CREATININE: 3.62 mg/dL — AB (ref 0.44–1.00)
Chloride: 99 mmol/L — ABNORMAL LOW (ref 101–111)
GFR, EST AFRICAN AMERICAN: 13 mL/min — AB (ref >60)
GFR, EST NON AFRICAN AMERICAN: 11 mL/min — AB (ref >60)
Glucose, Bld: 248 mg/dL — ABNORMAL HIGH (ref 65–99)
PHOSPHORUS: 4.1 mg/dL (ref 2.5–4.6)
Potassium: 3.9 mmol/L (ref 3.5–5.1)
SODIUM: 137 mmol/L (ref 135–145)

## 2015-10-04 LAB — CBC
HCT: 26.3 % — ABNORMAL LOW (ref 36.0–46.0)
Hemoglobin: 8.7 g/dL — ABNORMAL LOW (ref 12.0–15.0)
MCH: 25.1 pg — AB (ref 26.0–34.0)
MCHC: 33.1 g/dL (ref 30.0–36.0)
MCV: 75.8 fL — ABNORMAL LOW (ref 78.0–100.0)
PLATELETS: 155 10*3/uL (ref 150–400)
RBC: 3.47 MIL/uL — AB (ref 3.87–5.11)
RDW: 17.2 % — ABNORMAL HIGH (ref 11.5–15.5)
WBC: 17.2 10*3/uL — ABNORMAL HIGH (ref 4.0–10.5)

## 2015-10-04 LAB — GLUCOSE, CAPILLARY
GLUCOSE-CAPILLARY: 151 mg/dL — AB (ref 65–99)
GLUCOSE-CAPILLARY: 158 mg/dL — AB (ref 65–99)
Glucose-Capillary: 146 mg/dL — ABNORMAL HIGH (ref 65–99)

## 2015-10-04 MED ORDER — HEPARIN SODIUM (PORCINE) 1000 UNIT/ML DIALYSIS: 1000.0000 [IU] | INTRAMUSCULAR | Status: DC | PRN

## 2015-10-04 MED ORDER — ONDANSETRON HCL 4 MG/2ML IJ SOLN
INTRAMUSCULAR | Status: AC
  Filled 2015-10-04: qty 2

## 2015-10-04 MED ORDER — LIDOCAINE HCL (PF) 1 % IJ SOLN: 5.0000 mL | INTRAMUSCULAR | Status: DC | PRN

## 2015-10-04 MED ORDER — LIDOCAINE-PRILOCAINE 2.5-2.5 % EX CREA: 1.0000 "application " | TOPICAL_CREAM | CUTANEOUS | Status: DC | PRN

## 2015-10-04 MED ORDER — SODIUM CHLORIDE 0.9 % IV SOLN: 100.0000 mL | INTRAVENOUS | Status: DC | PRN

## 2015-10-04 MED ORDER — HEPARIN SODIUM (PORCINE) 1000 UNIT/ML DIALYSIS: 3000.0000 [IU] | INTRAMUSCULAR | Status: DC | PRN

## 2015-10-04 MED ORDER — ALTEPLASE 2 MG IJ SOLR: 2.0000 mg | Freq: Once | INTRAMUSCULAR | Status: DC | PRN

## 2015-10-04 MED ORDER — PENTAFLUOROPROP-TETRAFLUOROETH EX AERO: 1.0000 "application " | INHALATION_SPRAY | CUTANEOUS | Status: DC | PRN

## 2015-10-04 NOTE — Progress Notes (Signed)
PT Cancellation Note  Patient Details Name: Myrtice LauthClara P Heeter MRN: [id number] DOB: 21-Nov-1933   Cancelled Treatment:    Reason Eval/Treat Not Completed: Other (comment)   Feeling exhausted post HD today and politely declining OOB;   Will follow up tomorrow (she promises she'll get up tomorrow)  Van ClinesHolly Zailah Zagami, PT  Acute Rehabilitation Services Pager [phone] Office [phone]    Van ClinesGarrigan, Jayron Maqueda [facility] 10/04/2015, 4:04 PM

## 2015-10-04 NOTE — Progress Notes (Signed)
PROGRESS NOTE  Katherine [name] ZOX:[id number] DOB: [birth year] DOA: 09/25/2015 PCP: [name], MD Outpatient Specialists:    LOS: 9 days    Brief Narrative: Katherine [name] is an 82 y.o. female past medical history chronic kidney C stage 3-4, essential hypertension that comes to the ED complaining of shortness of breath and weight gain with lower extremity edema, she reports diet indiscretion for the last 2 or 3 weeks, she had her diuretic discontinued 2 weeks prior to admission. renal been consulted for concern for dialysis needs in the near future, Should continue to have poor response and worsening renal function on significant IV diuresis, a temporary dialysis catheter inserted 5/1, and started dialysis on 5/1  Assessment & Plan: Active Problems:   DM (diabetes mellitus) (HCC)   HTN (hypertension)   Dyslipidemia   Morbid obesity (HCC)   CKD (chronic kidney disease) stage 2, GFR 60-89 ml/min   Acute respiratory failure with hypoxia (HCC)   Acute diastolic CHF (congestive heart failure) (HCC)   Acute respiratory resp. failure with hypoxia  - due toVolume overload from Acute diastolic CHF (congestive heart failure) (HCC)/chronic kidney disease stage 3-4/Cardiorenal syndrome - Difficult situation as her renal function continued to deteriorate, with minimal diuresis on large dose of IV diuretics, and patient needed to be started on hemodialysis on 5/1 - Continue with oxygen, wean as tolerated  AKI on chronic kidney disease stage IV with a solitary functioning renal kidney - IRPlaced temporary right IJ hemodialysis catheter placement 5/1 - Management per nephrology, started dialysis on 10/02/2015  Severe nausea - possible ventral mass on exam, obtain CT abdomen and pelvis without contrast, by mouth contrast only - Symptomatic management  Leukocytosis  - increase from 11 yesterday to 17 today noted, afebrile, no respiratory symptoms, awaiting CT scan of the abdomen and pelvis     DM (diabetes mellitus) (HCC) uncontrolled: - On insulin sliding scale, and NovoLog 3 units before meals, on Lantus from 8  - A1c is 9.0 - Fasting sugar better this morning, continue current regimen  Essential HTN (hypertension) - off amlodipine, continue with Coreg and BiDil. Per renal Recommendation  Dyslipidemia - Continue statins.  Morbid obesity (HCC) - Advise to follow low calorie diet and to increase physical activity - Body mass index is 48.68 kg/(m^2  Anemia - Anemia of chronic kidney disease, status post IV iron  Depression  - started on low-dose Lexapro  DVT prophylaxis: Heparin  Code Status: Full code  Family Communication: No family at bedside, patient seen HD  Disposition Plan: TBD, SNF when ready Barriers for discharge: HD needs  Consultants:   Nephrology   Procedures:   2D echo: EF 55-60%, grade 1 diastolic dysfunction  Antimicrobials:  None    Subjective: - appears uncomfortable, complains of nausea. Denies pain. Denies shortness of breath   Objective: Filed Vitals:   10/04/15 1000 10/04/15 1020 10/04/15 1030 10/04/15 1045  BP: 115/56 86/47 113/53 119/55  Pulse: 59 60 61 62  Temp:    97.8 F (36.6 C)  TempSrc:    Oral  Resp:    16  Height:      Weight:    120.1 kg (264 lb 12.4 oz)  SpO2:    96%    Intake/Output Summary (Last 24 hours) at 10/04/15 1248 Last data filed at 10/04/15 1045  Gross per 24 hour  Intake 494.66 ml  Output   4077 ml  Net -3582.34 ml   Filed Weights   10/04/15 0456 10/04/15  [id number] 10/04/15 1045  Weight: 120 kg (264 lb 8.8 oz) 121.9 kg (268 lb 11.9 oz) 120.1 kg (264 lb 12.4 oz)    Examination: Constitutional: NAD Filed Vitals:   10/04/15 1000 10/04/15 1020 10/04/15 1030 10/04/15 1045  BP: 115/56 86/47 113/53 119/55  Pulse: 59 60 61 62  Temp:    97.8 F (36.6 C)  TempSrc:    Oral  Resp:    16  Height:      Weight:    120.1 kg (264 lb 12.4 oz)  SpO2:    96%   Eyes: PERRL, lids and conjunctivae  normal ENMT: Mucous membranes are moist. No oropharyngeal exudates Neck: normal, supple, no masses Respiratory: clear to auscultation bilaterally, no wheezing, no crackles. Normal respiratory effort. No accessory muscle use.  Cardiovascular: Regular rate and rhythm, no murmurs / rubs / gallops. No extremity edema. 2+ pedal pulses. No carotid bruits.  Abdomen: no tenderness. Bowel sounds positive.  Musculoskeletal: no clubbing / cyanosis.   Neurologic: non focal   Data Reviewed: I have personally reviewed following labs and imaging studies  CBC:  Recent Labs Lab 09/28/15 0450 09/29/15 0433 10/01/15 0838 10/03/15 0515 10/04/15 0458  WBC 9.8 8.6 12.2* 11.7* 17.2*  HGB 8.4* 8.4* 8.9* 8.9* 8.7*  HCT 25.5* 25.7* 27.3* 27.2* 26.3*  MCV 76.6* 77.9* 78.9 76.6* 75.8*  PLT 166 205 249 177 155   Basic Metabolic Panel:  Recent Labs Lab 09/30/15 0535 10/01/15 0535 10/02/15 0535 10/03/15 0515 10/04/15 0458  NA 142 141 141 137 137  K 3.8 3.5 3.4* 4.0 3.9  CL 105 102 100* 98* 99*  CO2 26 28 27 25 25   GLUCOSE 155* 171* 247* 256* 248*  BUN 60* 69* 82* 67* 52*  CREATININE 3.37* 3.64* 3.88* 3.88* 3.62*  CALCIUM 8.5* 8.3* 8.5* 8.8* 8.9  PHOS 4.8* 4.9* 5.3* 4.8* 4.1   GFR: Estimated Creatinine Clearance: 16.1 mL/min (by C-G formula based on Cr of 3.62). Liver Function Tests:  Recent Labs Lab 09/30/15 0535 10/01/15 0535 10/02/15 0535 10/03/15 0515 10/04/15 0458  ALBUMIN 2.3* 2.2* 2.1* 2.2* 2.0*   No results for input(s): LIPASE, AMYLASE in the last 168 hours. No results for input(s): AMMONIA in the last 168 hours. Coagulation Profile: No results for input(s): INR, PROTIME in the last 168 hours. Cardiac Enzymes: No results for input(s): CKTOTAL, CKMB, CKMBINDEX, TROPONINI in the last 168 hours. BNP (last 3 results) No results for input(s): PROBNP in the last 8760 hours. HbA1C: No results for input(s): HGBA1C in the last 72 hours. CBG:  Recent Labs Lab 10/03/15 0839  10/03/15 1204 10/03/15 1743 10/03/15 2108 10/04/15 1203  GLUCAP 251* 299* 202* 217* 151*   Lipid Profile: No results for input(s): CHOL, HDL, LDLCALC, TRIG, CHOLHDL, LDLDIRECT in the last 72 hours. Thyroid Function Tests: No results for input(s): TSH, T4TOTAL, FREET4, T3FREE, THYROIDAB in the last 72 hours. Anemia Panel: No results for input(s): VITAMINB12, FOLATE, FERRITIN, TIBC, IRON, RETICCTPCT in the last 72 hours. Urine analysis:    Component Value Date/Time   COLORURINE YELLOW 08/21/2015 0809   APPEARANCEUR CLEAR 08/21/2015 0809   LABSPEC 1.014 08/21/2015 0809   PHURINE 6.5 08/21/2015 0809   GLUCOSEU NEGATIVE 08/21/2015 0809   HGBUR NEGATIVE 08/21/2015 0809   BILIRUBINUR NEGATIVE 08/21/2015 0809   KETONESUR NEGATIVE 08/21/2015 0809   PROTEINUR 100* 08/21/2015 0809   UROBILINOGEN 1.0 02/11/2014 0913   NITRITE NEGATIVE 08/21/2015 0809   LEUKOCYTESUR NEGATIVE 08/21/2015 0809   Sepsis Labs: Invalid input(s): PROCALCITONIN, LACTICIDVEN  No results found for this or any previous visit (from the past 240 hour(s)).    Radiology Studies: No results found.   Scheduled Meds: . aspirin EC  81 mg Oral Daily  . docusate sodium  200 mg Oral QHS  . escitalopram  5 mg Oral Daily  . heparin  5,000 Units Subcutaneous Q8H  . insulin aspart  0-9 Units Subcutaneous TID WC  . insulin aspart  3 Units Subcutaneous TID WC  . insulin glargine  8 Units Subcutaneous Daily  . pneumococcal 23 valent vaccine  0.5 mL Intramuscular Tomorrow-1000  . rosuvastatin  10 mg Oral q1800   Continuous Infusions:        Pamella Pert, MD, PhD [facility] Pager [phone]  If 7PM-7AM, please contact night-coverage [email or web address] Password [email or web address] 10/04/2015, 12:48 PM

## 2015-10-04 NOTE — Progress Notes (Signed)
Patient ID: Katherine LauthClara P [name], female   DOB: 07/18/1933, 82 y.o.   MRN: [id number]   [facility] Progress Note    Assessment:   1. AKI/ CKD 4/ solitary kidney:  prob cardiorenal syndrome. HD #3 today. Nausea no better. Marginal candidate for chron HD d/t mobility and comorbidity issues.  2. Acute diast CHF- wt's and vol down, repeat CXR today 3. Anemia: sp feraheme x 1 on 4/27 (510 mg, tfs was 5%); start darbe 100/ wk, give 500mg  more Fe 4. Hypertension: bp's soft, bp meds stopped 5. DM on insulin 6. Nausea - severe, needs check for alternate etiology than uremia, should have resolved by now. Has a mid-abdomen mass, not sure what this is, nontender. Prob needs CT , no IV contrast please though. Have d/w primary MD  Plan - CXR, IV fe, esa, hold HD now see if recovering fxn      Vinson Moselleob Neng Albee MD [facility] pager [phone]    cell [phone] 10/04/2015, 10:21 AM    Subjective:   Severe nausea still.  Had HD yesterday.     Objective:   BP 115/56 mmHg  Pulse 59  Temp(Src) 98.5 F (36.9 C) (Oral)  Resp 19  Ht 5\' 4"  (1.626 m)  Wt 121.9 kg (268 lb 11.9 oz)  BMI 46.11 kg/m2  SpO2 96%  Intake/Output Summary (Last 24 hours) at 10/04/15 1021 Last data filed at 10/04/15 0648  Gross per 24 hour  Intake 494.66 ml  Output   2125 ml  Net -1630.34 ml   Weight change: -5.9 kg (-13 lb 0.1 oz)  Physical Exam: Gen: Resting uncomfortably in bed CVS: Pulse sinus rhythm, normal rate, S1 and S2 normal  Resp: Poor inspiratory effort with bibasilar rales Abd: Soft, obese, nontender, firm nontender mass midabdomen Ext: 1-2+ lower extremity edema bilaterally  CXR 4/24 bilat effusions layering, vasc congestion, c/w chf   Imaging: No results found.  Labs: BMET  Recent Labs Lab 09/28/15 0450 09/29/15 0433 09/30/15 0535 10/01/15 0535 10/02/15 0535 10/03/15 0515 10/04/15 0458  NA 143 145 142 141 141 137 137  K 3.9 4.0 3.8 3.5 3.4* 4.0 3.9  CL 107 107 105  102 100* 98* 99*  CO2 26 27 26 28 27 25 25   GLUCOSE 128* 133* 155* 171* 247* 256* 248*  BUN 48* 56* 60* 69* 82* 67* 52*  CREATININE 3.23* 3.47* 3.37* 3.64* 3.88* 3.88* 3.62*  CALCIUM 8.4* 8.7* 8.5* 8.3* 8.5* 8.8* 8.9  PHOS 3.8 4.4 4.8* 4.9* 5.3* 4.8* 4.1   CBC  Recent Labs Lab 09/29/15 0433 10/01/15 0838 10/03/15 0515 10/04/15 0458  WBC 8.6 12.2* 11.7* 17.2*  HGB 8.4* 8.9* 8.9* 8.7*  HCT 25.7* 27.3* 27.2* 26.3*  MCV 77.9* 78.9 76.6* 75.8*  PLT 205 249 177 155   Medications:    . aspirin EC  81 mg Oral Daily  . docusate sodium  200 mg Oral QHS  . escitalopram  5 mg Oral Daily  . heparin  5,000 Units Subcutaneous Q8H  . insulin aspart  0-9 Units Subcutaneous TID WC  . insulin aspart  3 Units Subcutaneous TID WC  . insulin glargine  8 Units Subcutaneous Daily  . metolazone  5 mg Oral BID  . pneumococcal 23 valent vaccine  0.5 mL Intramuscular Tomorrow-1000  . rosuvastatin  10 mg Oral q1800  . sodium chloride flush  3 mL Intravenous Q12H

## 2015-10-05 ENCOUNTER — Inpatient Hospital Stay (HOSPITAL_COMMUNITY): Payer: Medicare Other

## 2015-10-05 LAB — GLUCOSE, CAPILLARY
GLUCOSE-CAPILLARY: 123 mg/dL — AB (ref 65–99)
GLUCOSE-CAPILLARY: 115 mg/dL — AB (ref 65–99)
Glucose-Capillary: 156 mg/dL — ABNORMAL HIGH (ref 65–99)
Glucose-Capillary: 195 mg/dL — ABNORMAL HIGH (ref 65–99)

## 2015-10-05 LAB — CBC
HCT: 27.5 % — ABNORMAL LOW (ref 36.0–46.0)
HEMOGLOBIN: 8.9 g/dL — AB (ref 12.0–15.0)
MCH: 25.1 pg — ABNORMAL LOW (ref 26.0–34.0)
MCHC: 32.4 g/dL (ref 30.0–36.0)
MCV: 77.7 fL — ABNORMAL LOW (ref 78.0–100.0)
PLATELETS: 142 10*3/uL — AB (ref 150–400)
RBC: 3.54 MIL/uL — AB (ref 3.87–5.11)
RDW: 17.2 % — ABNORMAL HIGH (ref 11.5–15.5)
WBC: 17.1 10*3/uL — AB (ref 4.0–10.5)

## 2015-10-05 LAB — SODIUM, URINE, RANDOM: SODIUM UR: 23 mmol/L

## 2015-10-05 LAB — URINALYSIS, ROUTINE W REFLEX MICROSCOPIC
Glucose, UA: NEGATIVE mg/dL
Ketones, ur: 15 mg/dL — AB
Nitrite: NEGATIVE
PH: 5 (ref 5.0–8.0)
Protein, ur: >300 mg/dL — AB
SPECIFIC GRAVITY, URINE: 1.02 (ref 1.005–1.030)

## 2015-10-05 LAB — RENAL FUNCTION PANEL
ALBUMIN: 1.9 g/dL — AB (ref 3.5–5.0)
ALBUMIN: 1.9 g/dL — AB (ref 3.5–5.0)
ANION GAP: 14 (ref 5–15)
ANION GAP: 14 (ref 5–15)
BUN: 42 mg/dL — ABNORMAL HIGH (ref 6–20)
BUN: 42 mg/dL — ABNORMAL HIGH (ref 6–20)
CALCIUM: 9 mg/dL (ref 8.9–10.3)
CALCIUM: 9.1 mg/dL (ref 8.9–10.3)
CO2: 26 mmol/L (ref 22–32)
CO2: 26 mmol/L (ref 22–32)
CREATININE: 3.4 mg/dL — AB (ref 0.44–1.00)
CREATININE: 3.42 mg/dL — AB (ref 0.44–1.00)
Chloride: 99 mmol/L — ABNORMAL LOW (ref 101–111)
Chloride: 99 mmol/L — ABNORMAL LOW (ref 101–111)
GFR calc non Af Amer: 12 mL/min — ABNORMAL LOW (ref >60)
GFR, EST AFRICAN AMERICAN: 14 mL/min — AB (ref >60)
GFR, EST AFRICAN AMERICAN: 14 mL/min — AB (ref >60)
GFR, EST NON AFRICAN AMERICAN: 12 mL/min — AB (ref >60)
Glucose, Bld: 152 mg/dL — ABNORMAL HIGH (ref 65–99)
Glucose, Bld: 154 mg/dL — ABNORMAL HIGH (ref 65–99)
PHOSPHORUS: 4.1 mg/dL (ref 2.5–4.6)
PHOSPHORUS: 4.1 mg/dL (ref 2.5–4.6)
Potassium: 3.9 mmol/L (ref 3.5–5.1)
Potassium: 3.9 mmol/L (ref 3.5–5.1)
SODIUM: 139 mmol/L (ref 135–145)
SODIUM: 139 mmol/L (ref 135–145)

## 2015-10-05 LAB — URINE MICROSCOPIC-ADD ON

## 2015-10-05 MED ORDER — SODIUM CHLORIDE 0.9 % IV SOLN
125.0000 mg | INTRAVENOUS | Status: DC
  Filled 2015-10-05: qty 10

## 2015-10-05 MED ORDER — ALTEPLASE 2 MG IJ SOLR: 2.0000 mg | Freq: Once | INTRAMUSCULAR | Status: DC | PRN

## 2015-10-05 MED ORDER — SODIUM CHLORIDE 0.9 % IV SOLN: 100.0000 mL | INTRAVENOUS | Status: DC | PRN

## 2015-10-05 MED ORDER — LIDOCAINE-PRILOCAINE 2.5-2.5 % EX CREA: 1.0000 "application " | TOPICAL_CREAM | CUTANEOUS | Status: DC | PRN

## 2015-10-05 MED ORDER — HEPARIN SODIUM (PORCINE) 1000 UNIT/ML DIALYSIS: 1000.0000 [IU] | INTRAMUSCULAR | Status: DC | PRN

## 2015-10-05 MED ORDER — HEPARIN SODIUM (PORCINE) 1000 UNIT/ML DIALYSIS: 2400.0000 [IU] | INTRAMUSCULAR | Status: DC | PRN

## 2015-10-05 MED ORDER — HEPARIN SODIUM (PORCINE) 1000 UNIT/ML DIALYSIS: 2500.0000 [IU] | INTRAMUSCULAR | Status: DC | PRN

## 2015-10-05 MED ORDER — CEFTRIAXONE SODIUM 1 G IJ SOLR
1.0000 g | INTRAMUSCULAR | Status: DC
  Administered 2015-10-05 – 2015-10-08 (×4): 1 g via INTRAVENOUS
  Filled 2015-10-05 (×5): qty 10

## 2015-10-05 MED ORDER — GUAIFENESIN ER 600 MG PO TB12
600.0000 mg | ORAL_TABLET | Freq: Two times a day (BID) | ORAL | Status: DC
  Administered 2015-10-05 – 2015-10-14 (×16): 600 mg via ORAL
  Filled 2015-10-05 (×18): qty 1

## 2015-10-05 MED ORDER — DARBEPOETIN ALFA 100 MCG/0.5ML IJ SOSY
100.0000 ug | PREFILLED_SYRINGE | INTRAMUSCULAR | Status: DC
  Administered 2015-10-07: 100 ug via INTRAVENOUS
  Filled 2015-10-05: qty 0.5

## 2015-10-05 MED ORDER — LIDOCAINE HCL (PF) 1 % IJ SOLN: 5.0000 mL | INTRAMUSCULAR | Status: DC | PRN

## 2015-10-05 MED ORDER — PENTAFLUOROPROP-TETRAFLUOROETH EX AERO: 1.0000 "application " | INHALATION_SPRAY | CUTANEOUS | Status: DC | PRN

## 2015-10-05 NOTE — Progress Notes (Signed)
Patient ID: Katherine [name], female   DOB: 05-02-1934, 82 y.o.   MRN: [id number]  [facility] Progress Note    Assessment:   1. AKI/ CKD 4/ solitary kidney:  prob cardiorenal syndrome. First day that she feels "better" , but still quite nauseous and still vomiting.  Will plan HD again today and tomorrow ,^BFR and get solute down, see how she responds, try and get nausea under control.   2. Acute diast CHF- wt's and vol down, lungs clear.  3. Anemia: sp feraheme x 1 on 4/27 (510 mg, tfs was 5%); start darbe 100/ wk, give  more Fe over  4. Hypertension: bp's soft, bp meds stopped 5. DM on insulin 6. Nausea - a little better, agree that fluid collection on CT is not likely an acute abscess  Plan -  HD today and tomorrow, CXR, Fe, esa. No vol off w HD.    Vinson Moselle MD [facility] pager [phone]    cell [phone] 10/05/2015, 11:39 AM    Subjective:   Feels a little better today, for the "first time", but still quite nauseous   Objective:   BP 146/48 mmHg  Pulse 72  Temp(Src) 97.4 F (36.3 C) (Oral)  Resp 18  Ht  (1.626 m)  Wt 117.5 kg (259 lb 0.7 oz)  BMI 44.44 kg/m2  SpO2 96%  Intake/Output Summary (Last 24 hours) at 10/05/15 1139 Last data filed at 10/05/15 0734  Gross per 24 hour  Intake    360 ml  Output      0 ml  Net    360 ml   Weight change: 0.8 kg (1 lb 12.2 oz)  Physical Exam: Gen: Resting uncomfortably in bed CVS: Pulse sinus rhythm, normal rate, S1 and S2 normal  Resp: Poor inspiratory effort with bibasilar rales Abd: Soft, obese, nontender, firm nontender mass midabdomen Ext: 1-2+ lower extremity edema bilaterally  CXR 4/24 bilat effusions layering, vasc congestion, c/w chf   Imaging: Ct Abdomen Wo Contrast  10/04/2015  CLINICAL DATA:  Nausea. EXAM: CT ABDOMEN WITHOUT CONTRAST TECHNIQUE: Multidetector CT imaging of the abdomen was performed following the standard protocol without IV contrast. COMPARISON:  CT  scan dated 02/03/2009 FINDINGS: Lower chest: There are tiny bilateral pleural effusions with minimal atelectasis at the lung bases. Borderline neck chronic cardiomegaly. Hepatobiliary: Gallbladder has been removed. Liver and biliary tree are otherwise normal. Pancreas: The uncinate process and head of the pancreas are normal. The body and tail of the pancreas are not visible and are most likely severely fatty replaced. This is unchanged since the prior study. Spleen: Normal. Adrenals/Urinary Tract: New bilateral adrenal hyperplasia. The left kidney is small and irregularly calcified and unchanged. Right kidney appears normal with no hydronephrosis. Stomach/Bowel: There are few scattered diverticula in the visualized portion of the left side of the colon. Small hiatal hernia. The visualized portion of the bowel is otherwise normal except for a 2.5 cm diverticulum in the second portion the duodenum adjacent to the uncinate process of pancreas. Vascular/Lymphatic: Aorto bi-iliac atherosclerosis.  No adenopathy. Musculoskeletal: The patient has developed extensive erosions of the iliac side of both sacroiliac joints consistent with sacroiliitis secondary to renal osteodystrophy. There is marked progression of degenerative disc and joint disease in the lumbar spine with erosions of the inferior endplate of L1 which may also be related to renal osteodystrophy. There is a 10.6 x 9.6 x 7.7 cm well-defined fluid collection in the anterior abdominal wall with a slightly  enhancing rim and small amount of calcification in the periphery of the lesion. This is at the site of a previous abdominal wall abscess. IMPRESSION: 1. New bilateral adrenal hyperplasia, nonspecific. 2. New extensive bilateral sacroiliitis most likely secondary to renal osteodystrophy. 3. Marked progression of degenerative disc and joint disease in the lumbar spine. 4. Prominent probable chronic fluid collection in the anterior abdominal wall just to the left  of midline, probably representing a chronic seroma or chronic abscess. This was the site of an abscess on the prior exam. 5. Small hiatal hernia. Electronically Signed   By: Francene BoyersJames  Maxwell M.D.   On: 10/04/2015 16:50    Labs: BMET  Recent Labs Lab 09/29/15 [id number] 09/30/15 0535 10/01/15 0535 10/02/15 0535 10/03/15 0515 10/04/15 0458 10/05/15 0431  NA 145 142 141 141 137 137 139  139  K 4.0 3.8 3.5 3.4* 4.0 3.9 3.9  3.9  CL 107 105 102 100* 98* 99* 99*  99*  CO2 27 26 28 27 25 25 26  26   GLUCOSE 133* 155* 171* 247* 256* 248* 152*  154*  BUN 56* 60* 69* 82* 67* 52* 42*  42*  CREATININE 3.47* 3.37* 3.64* 3.88* 3.88* 3.62* 3.40*  3.42*  CALCIUM 8.7* 8.5* 8.3* 8.5* 8.8* 8.9 9.1  9.0  PHOS 4.4 4.8* 4.9* 5.3* 4.8* 4.1 4.1  4.1   CBC  Recent Labs Lab 10/01/15 0838 10/03/15 0515 10/04/15 0458 10/05/15 0431  WBC 12.2* 11.7* 17.2* 17.1*  HGB 8.9* 8.9* 8.7* 8.9*  HCT 27.3* 27.2* 26.3* 27.5*  MCV 78.9 76.6* 75.8* 77.7*  PLT 249 177 155 142*   Medications:    . aspirin EC  81 mg Oral Daily  . docusate sodium  200 mg Oral QHS  . escitalopram  5 mg Oral Daily  . guaiFENesin  600 mg Oral BID  . heparin  5,000 Units Subcutaneous Q8H  . insulin aspart  0-9 Units Subcutaneous TID WC  . insulin aspart  3 Units Subcutaneous TID WC  . insulin glargine  8 Units Subcutaneous Daily  . pneumococcal 23 valent vaccine  0.5 mL Intramuscular Tomorrow-1000  . rosuvastatin  10 mg Oral q1800

## 2015-10-05 NOTE — Progress Notes (Signed)
PROGRESS NOTE  Katherine [name] ZOX:[id number] DOB: [birth year] DOA: 09/25/2015 PCP: [name], MD Outpatient Specialists:    LOS: 10 days    Brief Narrative: Katherine [name] is an 82 y.o. female past medical history chronic kidney C stage 3-4, essential hypertension that comes to the ED complaining of shortness of breath and weight gain with lower extremity edema, she reports diet indiscretion for the last 2 or 3 weeks, she had her diuretic discontinued 2 weeks prior to admission. renal been consulted for concern for dialysis needs in the near future, Should continue to have poor response and worsening renal function on significant IV diuresis, a temporary dialysis catheter inserted 5/1, and started dialysis on 5/1  Assessment & Plan: Active Problems:   DM (diabetes mellitus) (HCC)   HTN (hypertension)   Dyslipidemia   Morbid obesity (HCC)   CKD (chronic kidney disease) stage 2, GFR 60-89 ml/min   Acute respiratory failure with hypoxia (HCC)   Acute diastolic CHF (congestive heart failure) (HCC)   Acute respiratory resp. failure with hypoxia  - due to volume overload from acute diastolic CHF (congestive heart failure) (HCC)/chronic kidney disease stage 3-4/Cardiorenal syndrome - Difficult situation as her renal function continued to deteriorate, with minimal diuresis on large dose of IV diuretics, and patient needed to be started on hemodialysis on 5/1 - Continue with oxygen, wean as tolerated  AKI on chronic kidney disease stage IV with a solitary functioning renal kidney - IRPlaced temporary right IJ hemodialysis catheter placement 5/1 - Management per nephrology, started dialysis on 10/02/2015  Severe nausea - possible ventral mass on exam, obtain CT abdomen and pelvis without contrast, by mouth contrast only - Symptomatic management  Leukocytosis  - increase from 11 yesterday to 17 today noted, afebrile, no respiratory symptoms, awaiting CT scan of the abdomen and pelvis    DM (diabetes mellitus) (HCC) uncontrolled: - On insulin sliding scale, and NovoLog 3 units before meals, on Lantus from 8  - A1c is 9.0 - Fasting sugar better this morning, continue current regimen  Essential HTN (hypertension) - off amlodipine, continue with Coreg and BiDil. Per renal Recommendation  Dyslipidemia - Continue statins.  Morbid obesity (HCC) - Advise to follow low calorie diet and to increase physical activity - Body mass index is 48.68 kg/(m^2  Anemia - Anemia of chronic kidney disease, status post IV iron  Depression  - started on low-dose Lexapro  Abdominal fluid collection - likely chronic, discussed with IR today, it appears without surrounding inflammation and less likely to be infected - Patient states that she's had this swelling for a number of years, then get worse recently, she does not have any pain and otherwise is not bothering the patient. Agree with radiology that may be chronic and may not be fully beneficial to drainage at this point.  Possible UTI - Patient without symptoms, however with a fluid collection appearing chronic and not an active infection, elevated white count and nausea, may be related to the urinary tract infection. We'll go ahead and start ceftriaxone and sent for urine cultures  DVT prophylaxis: Heparin  Code Status: Full code  Family Communication:  discussed extensively with the patient's daughters Disposition Plan: TBD, SNF when ready Barriers for discharge: HD needs  Consultants:   Nephrology   Procedures:   2D echo: EF 55-60%, grade 1 diastolic dysfunction  Antimicrobials:  None    Subjective: - appears uncomfortable, complains of nausea. Denies pain. Denies shortness of breath   Objective: Filed  Vitals:   10/05/15 1057 10/05/15 1226 10/05/15 1234 10/05/15 1300  BP: 146/48 139/55 133/59 132/60  Pulse: 72 66 65 64  Temp: 97.4 F (36.3 C) 98.1 F (36.7 C)    TempSrc: Oral Oral    Resp: 18 18      Height:      Weight:  114.3 kg (251 lb 15.8 oz)    SpO2: 96% 99%      Intake/Output Summary (Last 24 hours) at 10/05/15 1320 Last data filed at 10/05/15 0734  Gross per 24 hour  Intake    360 ml  Output      0 ml  Net    360 ml   Filed Weights   10/04/15 1045 10/04/15 2132 10/05/15 1226  Weight: 120.1 kg (264 lb 12.4 oz) 117.5 kg (259 lb 0.7 oz) 114.3 kg (251 lb 15.8 oz)    Examination: Constitutional: NAD Filed Vitals:   10/05/15 1057 10/05/15 1226 10/05/15 1234 10/05/15 1300  BP: 146/48 139/55 133/59 132/60  Pulse: 72 66 65 64  Temp: 97.4 F (36.3 C) 98.1 F (36.7 C)    TempSrc: Oral Oral    Resp: 18 18    Height:      Weight:  114.3 kg (251 lb 15.8 oz)    SpO2: 96% 99%     Eyes: PERRL, lids and conjunctivae normal ENMT: Mucous membranes are moist. No oropharyngeal exudates Neck: normal, supple, no masses Respiratory: clear to auscultation bilaterally, no wheezing, no crackles. Normal respiratory effort. No accessory muscle use.  Cardiovascular: Regular rate and rhythm, no murmurs / rubs / gallops. No extremity edema. 2+ pedal pulses. No carotid bruits.  Abdomen: no tenderness. Bowel sounds positive.  Musculoskeletal: no clubbing / cyanosis.   Neurologic: non focal   Data Reviewed: I have personally reviewed following labs and imaging studies  CBC:  Recent Labs Lab 09/29/15 0433 10/01/15 0838 10/03/15 0515 10/04/15 0458 10/05/15 0431  WBC 8.6 12.2* 11.7* 17.2* 17.1*  HGB 8.4* 8.9* 8.9* 8.7* 8.9*  HCT 25.7* 27.3* 27.2* 26.3* 27.5*  MCV 77.9* 78.9 76.6* 75.8* 77.7*  PLT 205 249 177 155 142*   Basic Metabolic Panel:  Recent Labs Lab 10/01/15 0535 10/02/15 0535 10/03/15 0515 10/04/15 0458 10/05/15 0431  NA 141 141 137 137 139  139  K 3.5 3.4* 4.0 3.9 3.9  3.9  CL 102 100* 98* 99* 99*  99*  CO2 GLUCOSE 171* 247* 256* 248* 152*  154*  BUN 69* 82* 67* 52* 42*  42*  CREATININE 3.64* 3.88* 3.88* 3.62* 3.40*  3.42*   CALCIUM 8.3* 8.5* 8.8* 8.9 9.1  9.0  PHOS 4.9* 5.3* 4.8* 4.1 4.1  4.1   GFR: Estimated Creatinine Clearance: 16.6 mL/min (by C-G formula based on Cr of 3.4). Liver Function Tests:  Recent Labs Lab 10/01/15 0535 10/02/15 0535 10/03/15 0515 10/04/15 0458 10/05/15 0431  ALBUMIN 2.2* 2.1* 2.2* 2.0* 1.9*  1.9*   No results for input(s): LIPASE, AMYLASE in the last 168 hours. No results for input(s): AMMONIA in the last 168 hours. Coagulation Profile: No results for input(s): INR, PROTIME in the last 168 hours. Cardiac Enzymes: No results for input(s): CKTOTAL, CKMB, CKMBINDEX, TROPONINI in the last 168 hours. BNP (last 3 results) No results for input(s): PROBNP in the last 8760 hours. HbA1C: No results for input(s): HGBA1C in the last 72 hours. CBG:  Recent Labs Lab 10/04/15 1203 10/04/15 1639 10/04/15 2130 10/05/15 0742 10/05/15 1153  GLUCAP 151* 158* 146* 156* 195*   Lipid Profile: No results for input(s): CHOL, HDL, LDLCALC, TRIG, CHOLHDL, LDLDIRECT in the last 72 hours. Thyroid Function Tests: No results for input(s): TSH, T4TOTAL, FREET4, T3FREE, THYROIDAB in the last 72 hours. Anemia Panel: No results for input(s): VITAMINB12, FOLATE, FERRITIN, TIBC, IRON, RETICCTPCT in the last 72 hours. Urine analysis:    Component Value Date/Time   COLORURINE YELLOW 08/21/2015 0809   APPEARANCEUR CLEAR 08/21/2015 0809   LABSPEC 1.014 08/21/2015 0809   PHURINE 6.5 08/21/2015 0809   GLUCOSEU NEGATIVE 08/21/2015 0809   HGBUR NEGATIVE 08/21/2015 0809   BILIRUBINUR NEGATIVE 08/21/2015 0809   KETONESUR NEGATIVE 08/21/2015 0809   PROTEINUR 100* 08/21/2015 0809   UROBILINOGEN 1.0 02/11/2014 0913   NITRITE NEGATIVE 08/21/2015 0809   LEUKOCYTESUR NEGATIVE 08/21/2015 0809   Sepsis Labs: Invalid input(s): PROCALCITONIN, LACTICIDVEN  No results found for this or any previous visit (from the past 240 hour(s)).   Radiology Studies: Ct Abdomen Wo Contrast  10/04/2015   CLINICAL DATA:  Nausea. EXAM: CT ABDOMEN WITHOUT CONTRAST TECHNIQUE: Multidetector CT imaging of the abdomen was performed following the standard protocol without IV contrast. COMPARISON:  CT scan dated 02/03/2009 FINDINGS: Lower chest: There are tiny bilateral pleural effusions with minimal atelectasis at the lung bases. Borderline neck chronic cardiomegaly. Hepatobiliary: Gallbladder has been removed. Liver and biliary tree are otherwise normal. Pancreas: The uncinate process and head of the pancreas are normal. The body and tail of the pancreas are not visible and are most likely severely fatty replaced. This is unchanged since the prior study. Spleen: Normal. Adrenals/Urinary Tract: New bilateral adrenal hyperplasia. The left kidney is small and irregularly calcified and unchanged. Right kidney appears normal with no hydronephrosis. Stomach/Bowel: There are few scattered diverticula in the visualized portion of the left side of the colon. Small hiatal hernia. The visualized portion of the bowel is otherwise normal except for a 2.5 cm diverticulum in the second portion the duodenum adjacent to the uncinate process of pancreas. Vascular/Lymphatic: Aorto bi-iliac atherosclerosis.  No adenopathy. Musculoskeletal: The patient has developed extensive erosions of the iliac side of both sacroiliac joints consistent with sacroiliitis secondary to renal osteodystrophy. There is marked progression of degenerative disc and joint disease in the lumbar spine with erosions of the inferior endplate of L1 which may also be related to renal osteodystrophy. There is a 10.6 x 9.6 x 7.7 cm well-defined fluid collection in the anterior abdominal wall with a slightly enhancing rim and small amount of calcification in the periphery of the lesion. This is at the site of a previous abdominal wall abscess. IMPRESSION: 1. New bilateral adrenal hyperplasia, nonspecific. 2. New extensive bilateral sacroiliitis most likely secondary to renal  osteodystrophy. 3. Marked progression of degenerative disc and joint disease in the lumbar spine. 4. Prominent probable chronic fluid collection in the anterior abdominal wall just to the left of midline, probably representing a chronic seroma or chronic abscess. This was the site of an abscess on the prior exam. 5. Small hiatal hernia. Electronically Signed   By: Francene BoyersJames  Maxwell M.D.   On: 10/04/2015 16:50   Scheduled Meds: . aspirin EC  81 mg Oral Daily  . docusate sodium  200 mg Oral QHS  . escitalopram  5 mg Oral Daily  . guaiFENesin  600 mg Oral BID  . heparin  5,000 Units Subcutaneous Q8H  . insulin aspart  0-9 Units Subcutaneous TID WC  . insulin aspart  3 Units Subcutaneous TID WC  .  insulin glargine  8 Units Subcutaneous Daily  . pneumococcal 23 valent vaccine  0.5 mL Intramuscular Tomorrow-1000  . rosuvastatin  10 mg Oral q1800   Continuous Infusions:   Time spent: 35 minutes, more than 50% spent at bedside discussing with patient and patient's daughters, answering multiple questions  Katherine Pert, MD, PhD [facility] Pager [phone]  If 7PM-7AM, please contact night-coverage [email or web address] Password [email or web address] 10/05/2015, 1:20 PM

## 2015-10-05 NOTE — Progress Notes (Signed)
PT Cancellation Note  Patient Details Name: Katherine [name] MRN: [id number] DOB: [birth year]   Cancelled Treatment:    Reason Eval/Treat Not Completed: Patient at procedure or test/unavailable.  Patient in HD.  Will return tomorrow for PT session.   Vena AustriaDavis, Katherine [name] 10/05/2015, 1:50 PM Durenda HurtSusan [name]. Renaldo Fiddleravis, PT, [facility] Acute Rehab Services Pager [phone]

## 2015-10-06 LAB — RENAL FUNCTION PANEL
ANION GAP: 18 — AB (ref 5–15)
Albumin: 2.1 g/dL — ABNORMAL LOW (ref 3.5–5.0)
BUN: 29 mg/dL — ABNORMAL HIGH (ref 6–20)
CALCIUM: 8.9 mg/dL (ref 8.9–10.3)
CHLORIDE: 96 mmol/L — AB (ref 101–111)
CO2: 22 mmol/L (ref 22–32)
Creatinine, Ser: 2.69 mg/dL — ABNORMAL HIGH (ref 0.44–1.00)
GFR calc Af Amer: 18 mL/min — ABNORMAL LOW (ref >60)
GFR calc non Af Amer: 16 mL/min — ABNORMAL LOW (ref >60)
GLUCOSE: 104 mg/dL — AB (ref 65–99)
Phosphorus: 3.9 mg/dL (ref 2.5–4.6)
Potassium: 4.4 mmol/L (ref 3.5–5.1)
SODIUM: 136 mmol/L (ref 135–145)

## 2015-10-06 LAB — GLUCOSE, CAPILLARY
GLUCOSE-CAPILLARY: 109 mg/dL — AB (ref 65–99)
Glucose-Capillary: 109 mg/dL — ABNORMAL HIGH (ref 65–99)
Glucose-Capillary: 152 mg/dL — ABNORMAL HIGH (ref 65–99)
Glucose-Capillary: 129 mg/dL — ABNORMAL HIGH (ref 65–99)

## 2015-10-06 LAB — CBC
HCT: 29.1 % — ABNORMAL LOW (ref 36.0–46.0)
HEMOGLOBIN: 9.5 g/dL — AB (ref 12.0–15.0)
MCH: 25.3 pg — AB (ref 26.0–34.0)
MCHC: 32.6 g/dL (ref 30.0–36.0)
MCV: 77.6 fL — AB (ref 78.0–100.0)
Platelets: 161 10*3/uL (ref 150–400)
RBC: 3.75 MIL/uL — ABNORMAL LOW (ref 3.87–5.11)
RDW: 17.4 % — ABNORMAL HIGH (ref 11.5–15.5)
WBC: 16.1 10*3/uL — ABNORMAL HIGH (ref 4.0–10.5)

## 2015-10-06 LAB — TSH: TSH: 2.811 u[IU]/mL (ref 0.350–4.500)

## 2015-10-06 NOTE — Progress Notes (Signed)
Physical Therapy Treatment Patient Details Name: Myrtice LauthClara P Elders MRN: [id number] DOB: 1934-04-24 Today's Date: 10/06/2015    History of Present Illness 82 y.o. female with h/o B TKA, HTN, DM, CKD, CHF admitted with fall, increased BLE edema, weakness, SOB. Dx of pulmonary edema.     PT Comments    Patient making improvements with mobility and transfers.  Agree with need for SNF for continued therapy at d/c.  Follow Up Recommendations  SNF;Supervision/Assistance - 24 hour     Equipment Recommendations  Wheelchair (measurements PT)    Recommendations for Other Services OT consult     Precautions / Restrictions Precautions Precautions: Fall Restrictions Weight Bearing Restrictions: No    Mobility  Bed Mobility Overal bed mobility: Needs Assistance Bed Mobility: Rolling;Sidelying to Sit Rolling: Min assist Sidelying to sit: Mod assist;+2 for safety/equipment       General bed mobility comments: Verbal cues for technique.  Repeated verbal cues for hand placement.  Assist to move trunk to upright sitting position.  Once upright, patient able to maintain balance.  Transfers Overall transfer level: Needs assistance Equipment used: Rolling walker (2 wheeled) Transfers: Sit to/from UGI CorporationStand;Stand Pivot Transfers Sit to Stand: Min assist;+2 physical assistance Stand pivot transfers: Min assist;+2 safety/equipment       General transfer comment: Verbal cues for hand placement (repeated).  Patient able to stand with +2 min assist to power up and for balance.  Stood Clinical research associatex2.  On third attempt, patient able to take several shuffle steps to pivot to chair.  Ambulation/Gait                 Stairs            Wheelchair Mobility    Modified Rankin (Stroke Patients Only)       Balance   Sitting-balance support: No upper extremity supported;Feet supported Sitting balance-Leahy Scale: Fair     Standing balance support: Bilateral upper extremity supported Standing  balance-Leahy Scale: Poor                      Cognition Arousal/Alertness: Awake/alert Behavior During Therapy: WFL for tasks assessed/performed;Flat affect Overall Cognitive Status: Within Functional Limits for tasks assessed                      Exercises      General Comments        Pertinent Vitals/Pain Pain Assessment: No/denies pain    Home Living                      Prior Function            PT Goals (current goals can now be found in the care plan section) Progress towards PT goals: Progressing toward goals    Frequency  Min 3X/week    PT Plan Current plan remains appropriate    Co-evaluation             End of Session Equipment Utilized During Treatment: Gait belt;Oxygen Activity Tolerance: Patient limited by fatigue Patient left: in chair;with call bell/phone within reach;with family/visitor present     Time: [id number] PT Time Calculation (min) (ACUTE ONLY): 20 min  Charges:  $Therapeutic Activity: 8-22 mins                    G Codes:      Vena AustriaDavis, Aritha Huckeba H 10/06/2015, 5:19 PM Durenda HurtSusan H. Renaldo Fiddleravis, PT, [facility] Acute Rehab Services Pager [phone]

## 2015-10-06 NOTE — Care Management Important Message (Signed)
Important Message  Patient Details  Name: Katherine [name] MRN: [id number] Date of Birth: Jun 15, 1933   Medicare Important Message Given:  Yes    Katherine [name] 10/06/2015, 1:02 PM

## 2015-10-06 NOTE — Progress Notes (Signed)
Patient's daughter came to nurse's station and stated her mom was having labored breathing and c/o SOB. Upon assessment patient's O2 sats were 90-91% on RA. Placed on 2 L n/c and O2 sats increased to 96%. Patient expressed relief afterwards. Primary RN notified.  Leanna BattlesEckelmann, Corianna Avallone Eileen, RN.

## 2015-10-06 NOTE — Progress Notes (Signed)
PROGRESS NOTE  Katherine [name]:[id number] DOB: [birth year] DOA: 09/25/2015 PCP: [name], MD Outpatient Specialists:    LOS: 11 days    Brief Narrative: Katherine [name] is an 82 y.o. 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 Renal was consulted, patient did not respond to conservative management with high-dose Lasix, and eventually required to be started on dialysis on 5/1 and underwent hemodialysis 3 so far.  Assessment & Plan:  Active Problems:   DM (diabetes mellitus) (HCC)   HTN (hypertension)   Dyslipidemia   Morbid obesity (HCC)   CKD (chronic kidney disease) stage 2, GFR 60-89 ml/min   Acute respiratory failure with hypoxia (HCC)   Acute diastolic CHF (congestive heart failure) (HCC)   Acute respiratory resp. failure with hypoxia in the setting of acute on chronic diastolic heart failure - due to volume overload from acute diastolic CHF (congestive heart failure) (HCC)/chronic kidney disease stage 3-4/Cardiorenal syndrome - Difficult situation as her renal function continued to deteriorate, with minimal diuresis on large dose of IV diuretics, and patient needed to be started on hemodialysis on 5/1 - Continue with oxygen, wean as tolerated - Now volume management per dialysis  AKI on chronic kidney disease stage IV with a solitary functioning renal kidney - IRPlaced temporary right IJ hemodialysis catheter placement 5/1 - Management per nephrology, started dialysis on 10/02/2015  Severe nausea - initially felt to be due to renal failure, however this has not improved with hemodialysis, and thus patient underwent a CT scan of the  abdomen and pelvis on 5/3. This showed a 10.6 x 9.6 x 7.7 well-defined fluid collection in the anterior abdominal wall. I discussed with interventional radiology on 5/4, this appears to be chronic without any radiological findings of active infection or inflammation, patient states that she has had this fluid for a number of years. She had a chronic fluid collection which was called "abscess" in the past in 2010, however microbiology does not reveal any organisms and patient does not recall being on antibiotics. This appears most likely to be a seroma. - Patient denies any pain on exam  Leukocytosis  - 11>> 17 5/3 - CBC pending today   DM (diabetes mellitus) (HCC) uncontrolled - On insulin sliding scale, and NovoLog 3 units before meals, on Lantus from 8  - A1c is 9.0 - Fasting sugar better this morning, 104, continue current regimen  Essential HTN (hypertension) - off amlodipine, continue with Coreg and BiDil. - most recent BP 143/56  Dyslipidemia - Continue statins  Morbid obesity (HCC) - Advise to follow low calorie diet and to increase physical activity - Body mass index is 48.68 kg/(m^2  Anemia - Anemia of chronic kidney disease, status post IV iron  Depression  - started on low-dose Lexapro  Abdominal fluid collection - likely chronic, discussed with IR 5/4, it appears without surrounding inflammation and less likely to be infected. Agree with radiology that may be chronic and may not be fully beneficial to drainage at this point.  Possible UTI - Patient without symptoms, however with elevated white count and nausea, favor treating.  - Started on ceftriaxone 5/4, cultures pending    DVT prophylaxis: Heparin  Code Status: Full code  Family Communication:  discussed extensively with the patient's daughters 5/4 Disposition Plan: TBD, SNF when ready Barriers for discharge: HD needs  Consultants:   Nephrology   Procedures:   2D echo: EF 55-60%, grade 1 diastolic  dysfunction  HD  Foley  Antimicrobials:  Ceftriaxone 5/4 >>  Subjective: - appears uncomfortable, complains of nausea still. No pain. No chest pain, no abdominal pain.   Objective: Filed Vitals:   10/05/15 1600 10/05/15 1607 10/05/15 1707 10/05/15 2206  BP: 125/59 146/49 150/58 143/56  Pulse: 66 68 66 70  Temp:  98.1 F (36.7 C) 98 F (36.7 C) 98.8 F (37.1 C)  TempSrc:  Oral Oral Oral  Resp:  16 16 16   Height:      Weight:  114.3 kg (251 lb 15.8 oz)    SpO2:  95% 92% 90%    Intake/Output Summary (Last 24 hours) at 10/06/15 0954 Last data filed at 10/05/15 1758  Gross per 24 hour  Intake    120 ml  Output      0 ml  Net    120 ml   Filed Weights   10/04/15 2132 10/05/15 1226 10/05/15 1607  Weight: 117.5 kg (259 lb 0.7 oz) 114.3 kg (251 lb 15.8 oz) 114.3 kg (251 lb 15.8 oz)    Examination: Constitutional: NAD Filed Vitals:   10/05/15 1600 10/05/15 1607 10/05/15 1707 10/05/15 2206  BP: 125/59 146/49 150/58 143/56  Pulse: 66 68 66 70  Temp:  98.1 F (36.7 C) 98 F (36.7 C) 98.8 F (37.1 C)  TempSrc:  Oral Oral Oral  Resp:  16 16 16   Height:      Weight:  114.3 kg (251 lb 15.8 oz)    SpO2:  95% 92% 90%   Eyes: PERRL, lids and conjunctivae normal ENMT: Mucous membranes are moist. No oropharyngeal exudates Neck: normal, supple, no masses Respiratory: clear to auscultation bilaterally, no wheezing, no crackles. Normal respiratory effort. No accessory muscle use.  Cardiovascular: Regular rate and rhythm, no murmurs / rubs / gallops. No extremity edema. 2+ pedal pulses.  Abdomen: no tenderness. Bowel sounds positive. 10 cm mass anterior aspect  Musculoskeletal: no clubbing / cyanosis.   Neurologic: non focal    Data Reviewed: I have personally reviewed following labs and imaging studies  CBC:  Recent Labs Lab 10/01/15 0838 10/03/15 0515 10/04/15 0458 10/05/15 0431  WBC 12.2* 11.7* 17.2* 17.1*  HGB 8.9* 8.9* 8.7* 8.9*  HCT 27.3* 27.2* 26.3* 27.5*    MCV 78.9 76.6* 75.8* 77.7*  PLT 249 177 155 142*   Basic Metabolic Panel:  Recent Labs Lab 10/02/15 0535 10/03/15 0515 10/04/15 0458 10/05/15 0431 10/06/15 0653  NA 141 137 137 139  139 136  K 3.4* 4.0 3.9 3.9  3.9 4.4  CL 100* 98* 99* 99*  99* 96*  CO2 27 25 25 26  26 22   GLUCOSE 247* 256* 248* 152*  154* 104*  BUN 82* 67* 52* 42*  42* 29*  CREATININE 3.88* 3.88* 3.62* 3.40*  3.42* 2.69*  CALCIUM 8.5* 8.8* 8.9 9.1  9.0 8.9  PHOS 5.3* 4.8* 4.1 4.1  4.1 3.9   GFR: Estimated Creatinine Clearance: 21 mL/min (by C-G formula based on Cr of 2.69). Liver Function Tests:  Recent Labs Lab 10/02/15 0535 10/03/15 0515 10/04/15 0458 10/05/15 0431 10/06/15 0653  ALBUMIN 2.1* 2.2* 2.0* 1.9*  1.9* 2.1*   No results for input(s): LIPASE, AMYLASE in the last 168 hours. No  results for input(s): AMMONIA in the last 168 hours. Coagulation Profile: No results for input(s): INR, PROTIME in the last 168 hours. Cardiac Enzymes: No results for input(s): CKTOTAL, CKMB, CKMBINDEX, TROPONINI in the last 168 hours. BNP (last 3 results) No results for input(s): PROBNP in the last 8760 hours. HbA1C: No results for input(s): HGBA1C in the last 72 hours. CBG:  Recent Labs Lab 10/05/15 0742 10/05/15 1153 10/05/15 1745 10/05/15 2032 10/06/15 0807  GLUCAP 156* 195* 123* 115* 109*   Lipid Profile: No results for input(s): CHOL, HDL, LDLCALC, TRIG, CHOLHDL, LDLDIRECT in the last 72 hours. Thyroid Function Tests:  Recent Labs  10/06/15 0654  TSH 2.811   Anemia Panel: No results for input(s): VITAMINB12, FOLATE, FERRITIN, TIBC, IRON, RETICCTPCT in the last 72 hours. Urine analysis:    Component Value Date/Time   COLORURINE AMBER* 10/05/2015 1229   APPEARANCEUR TURBID* 10/05/2015 1229   LABSPEC 1.020 10/05/2015 1229   PHURINE 5.0 10/05/2015 1229   GLUCOSEU NEGATIVE 10/05/2015 1229   HGBUR LARGE* 10/05/2015 1229   BILIRUBINUR LARGE* 10/05/2015 1229   KETONESUR 15*  10/05/2015 1229   PROTEINUR >300* 10/05/2015 1229   UROBILINOGEN 1.0 02/11/2014 0913   NITRITE NEGATIVE 10/05/2015 1229   LEUKOCYTESUR LARGE* 10/05/2015 1229   Sepsis Labs: Invalid input(s): PROCALCITONIN, LACTICIDVEN  No results found for this or any previous visit (from the past 240 hour(s)).   Radiology Studies: Ct Abdomen Wo Contrast  10/04/2015  CLINICAL DATA:  Nausea. EXAM: CT ABDOMEN WITHOUT CONTRAST TECHNIQUE: Multidetector CT imaging of the abdomen was performed following the standard protocol without IV contrast. COMPARISON:  CT scan dated 02/03/2009 FINDINGS: Lower chest: There are tiny bilateral pleural effusions with minimal atelectasis at the lung bases. Borderline neck chronic cardiomegaly. Hepatobiliary: Gallbladder has been removed. Liver and biliary tree are otherwise normal. Pancreas: The uncinate process and head of the pancreas are normal. The body and tail of the pancreas are not visible and are most likely severely fatty replaced. This is unchanged since the prior study. Spleen: Normal. Adrenals/Urinary Tract: New bilateral adrenal hyperplasia. The left kidney is small and irregularly calcified and unchanged. Right kidney appears normal with no hydronephrosis. Stomach/Bowel: There are few scattered diverticula in the visualized portion of the left side of the colon. Small hiatal hernia. The visualized portion of the bowel is otherwise normal except for a 2.5 cm diverticulum in the second portion the duodenum adjacent to the uncinate process of pancreas. Vascular/Lymphatic: Aorto bi-iliac atherosclerosis.  No adenopathy. Musculoskeletal: The patient has developed extensive erosions of the iliac side of both sacroiliac joints consistent with sacroiliitis secondary to renal osteodystrophy. There is marked progression of degenerative disc and joint disease in the lumbar spine with erosions of the inferior endplate of L1 which may also be related to renal osteodystrophy. There is a 10.6  x 9.6 x 7.7 cm well-defined fluid collection in the anterior abdominal wall with a slightly enhancing rim and small amount of calcification in the periphery of the lesion. This is at the site of a previous abdominal wall abscess. IMPRESSION: 1. New bilateral adrenal hyperplasia, nonspecific. 2. New extensive bilateral sacroiliitis most likely secondary to renal osteodystrophy. 3. Marked progression of degenerative disc and joint disease in the lumbar spine. 4. Prominent probable chronic fluid collection in the anterior abdominal wall just to the left of midline, probably representing a chronic seroma or chronic abscess. This was the site of an abscess on the prior exam. 5. Small hiatal hernia. Electronically Signed   By:  Francene Boyers M.D.   On: 10/04/2015 16:50   Dg Chest Port 1 View  10/05/2015  CLINICAL DATA:  Followup pulmonary edema. History of diabetes, hypertension and sickle cell trait. EXAM: PORTABLE CHEST 1 VIEW COMPARISON:  09/25/2015 FINDINGS: Mild interstitial and bronchovascular prominence is noted in the lungs without overt pulmonary edema. No evidence of pneumonia. Mild elevation the right hemidiaphragm, stable. Mild enlargement of cardiac silhouette, also stable. No mediastinal or hilar masses. Right internal jugular tunneled central venous line. This is new from the prior exam. Distal tip lies in the right atrium. IMPRESSION: 1. Mild cardiomegaly and bronchovascular/interstitial prominence but no overt pulmonary edema. No evidence of pneumonia. Electronically Signed   By: Amie Portland M.D.   On: 10/05/2015 19:30   Scheduled Meds: . aspirin EC  81 mg Oral Daily  . cefTRIAXone (ROCEPHIN)  IV  1 g Intravenous Q24H  . darbepoetin (ARANESP) injection - DIALYSIS  100 mcg Intravenous Q Fri-HD  . docusate sodium  200 mg Oral QHS  . escitalopram  5 mg Oral Daily  . ferric gluconate (FERRLECIT/NULECIT) IV  125 mg Intravenous Q M,W,F-HD  . guaiFENesin  600 mg Oral BID  . heparin  5,000 Units  Subcutaneous Q8H  . insulin aspart  0-9 Units Subcutaneous TID WC  . insulin aspart  3 Units Subcutaneous TID WC  . insulin glargine  8 Units Subcutaneous Daily  . pneumococcal 23 valent vaccine  0.5 mL Intramuscular Tomorrow-1000  . rosuvastatin  10 mg Oral q1800   Continuous Infusions:   Pamella Pert, MD, PhD [facility] Pager [phone]  If 7PM-7AM, please contact night-coverage [email or web address] Password [email or web address] 10/06/2015, 9:54 AM

## 2015-10-06 NOTE — Progress Notes (Deleted)
Patient ID: Katherine [name], female   DOB: [birth year], 82 y.o.   MRN: [id number]  [facility] Progress Note    Assessment:   1. AKI/ CKD 4/ solitary kidney:  prob cardiorenal syndrome. SP HD x 4. 2. Vol excess/ pulm edema- resolved clinically, on room air. CXR improved/ resolved edema.  3. Anemia: sp feraheme x 1 on 4/27 (510 mg, tfs was 5%); started darbe 100/ wk, hold IV fe w nausea 4. Hypertension: bp meds stopped. BP's better 5. DM on insulin 6. Nausea - not improving despite HD x 4, not sure if could be psychogenic? Have d/w primary, he notes per staff that she has not been observed to ever vomit in spite of severe "nausea" for several days.   7. UTI/ fever/ Reina Fuse - on empiric Rocephin D#2  Plan -  HD tomorrow. Pt refused HD today and tells me "what's the point of doing this if I'm going to feel this bad, I might as well stop". Will d/w primary.    Vinson Moselle MD [facility] pager [phone]    cell [phone] 10/06/2015, 12:06 PM    Subjective:   As above   Objective:   BP 143/56 mmHg  Pulse 70  Temp(Src) 98.8 F (37.1 C) (Oral)  Resp 16  Ht  (1.626 m)  Wt 114.3 kg (251 lb 15.8 oz)  BMI 43.23 kg/m2  SpO2 90%  Intake/Output Summary (Last 24 hours) at 10/06/15 1206 Last data filed at 10/05/15 1758  Gross per 24 hour  Intake    120 ml  Output      0 ml  Net    120 ml   Weight change: -7.6 kg (-16 lb 12.1 oz)  Physical Exam: Gen: Resting uncomfortably in bed, :"nauseated" CVS: Pulse sinus rhythm, normal rate, S1 and S2 normal  Resp: Clear lungs bilat, no rales/ rhonchi Abd: Soft, obese, nontender, firm nontender mass midabdomen Ext: 1+ lower extremity edema bilaterally  CXR 4/24 bilat effusions layering, vasc congestion, c/w chf CXR 5/4 resolved effusions/ CHF, still some IS prominence   Imaging: Ct Abdomen Wo Contrast  10/04/2015  CLINICAL DATA:  Nausea. EXAM: CT ABDOMEN WITHOUT CONTRAST TECHNIQUE: Multidetector CT imaging of  the abdomen was performed following the standard protocol without IV contrast. COMPARISON:  CT scan dated 02/03/2009 FINDINGS: Lower chest: There are tiny bilateral pleural effusions with minimal atelectasis at the lung bases. Borderline neck chronic cardiomegaly. Hepatobiliary: Gallbladder has been removed. Liver and biliary tree are otherwise normal. Pancreas: The uncinate process and head of the pancreas are normal. The body and tail of the pancreas are not visible and are most likely severely fatty replaced. This is unchanged since the prior study. Spleen: Normal. Adrenals/Urinary Tract: New bilateral adrenal hyperplasia. The left kidney is small and irregularly calcified and unchanged. Right kidney appears normal with no hydronephrosis. Stomach/Bowel: There are few scattered diverticula in the visualized portion of the left side of the colon. Small hiatal hernia. The visualized portion of the bowel is otherwise normal except for a 2.5 cm diverticulum in the second portion the duodenum adjacent to the uncinate process of pancreas. Vascular/Lymphatic: Aorto bi-iliac atherosclerosis.  No adenopathy. Musculoskeletal: The patient has developed extensive erosions of the iliac side of both sacroiliac joints consistent with sacroiliitis secondary to renal osteodystrophy. There is marked progression of degenerative disc and joint disease in the lumbar spine with erosions of the inferior endplate of L1 which may also be related to renal osteodystrophy. There is a  10.6 x 9.6 x 7.7 cm well-defined fluid collection in the anterior abdominal wall with a slightly enhancing rim and small amount of calcification in the periphery of the lesion. This is at the site of a previous abdominal wall abscess. IMPRESSION: 1. New bilateral adrenal hyperplasia, nonspecific. 2. New extensive bilateral sacroiliitis most likely secondary to renal osteodystrophy. 3. Marked progression of degenerative disc and joint disease in the lumbar spine.  4. Prominent probable chronic fluid collection in the anterior abdominal wall just to the left of midline, probably representing a chronic seroma or chronic abscess. This was the site of an abscess on the prior exam. 5. Small hiatal hernia. Electronically Signed   By: Francene BoyersJames  Maxwell M.D.   On: 10/04/2015 16:50   Dg Chest Port 1 View  10/05/2015  CLINICAL DATA:  Followup pulmonary edema. History of diabetes, hypertension and sickle cell trait. EXAM: PORTABLE CHEST 1 VIEW COMPARISON:  09/25/2015 FINDINGS: Mild interstitial and bronchovascular prominence is noted in the lungs without overt pulmonary edema. No evidence of pneumonia. Mild elevation the right hemidiaphragm, stable. Mild enlargement of cardiac silhouette, also stable. No mediastinal or hilar masses. Right internal jugular tunneled central venous line. This is new from the prior exam. Distal tip lies in the right atrium. IMPRESSION: 1. Mild cardiomegaly and bronchovascular/interstitial prominence but no overt pulmonary edema. No evidence of pneumonia. Electronically Signed   By: Amie Portlandavid  Ormond M.D.   On: 10/05/2015 19:30    Labs: BMET  Recent Labs Lab 09/30/15 0535 10/01/15 0535 10/02/15 0535 10/03/15 0515 10/04/15 0458 10/05/15 0431 10/06/15 0653  NA 142 141 141 137 137 139  139 136  K 3.8 3.5 3.4* 4.0 3.9 3.9  3.9 4.4  CL 105 102 100* 98* 99* 99*  99* 96*  CO2 26 28 27 25 25 26  26 22   GLUCOSE 155* 171* 247* 256* 248* 152*  154* 104*  BUN 60* 69* 82* 67* 52* 42*  42* 29*  CREATININE 3.37* 3.64* 3.88* 3.88* 3.62* 3.40*  3.42* 2.69*  CALCIUM 8.5* 8.3* 8.5* 8.8* 8.9 9.1  9.0 8.9  PHOS 4.8* 4.9* 5.3* 4.8* 4.1 4.1  4.1 3.9   CBC  Recent Labs Lab 10/01/15 0838 10/03/15 0515 10/04/15 0458 10/05/15 0431  WBC 12.2* 11.7* 17.2* 17.1*  HGB 8.9* 8.9* 8.7* 8.9*  HCT 27.3* 27.2* 26.3* 27.5*  MCV 78.9 76.6* 75.8* 77.7*  PLT 249 177 155 142*   Medications:    . aspirin EC  81 mg Oral Daily  . cefTRIAXone (ROCEPHIN)  IV   1 g Intravenous Q24H  . darbepoetin (ARANESP) injection - DIALYSIS  100 mcg Intravenous Q Fri-HD  . docusate sodium  200 mg Oral QHS  . escitalopram  5 mg Oral Daily  . ferric gluconate (FERRLECIT/NULECIT) IV  125 mg Intravenous Q M,W,F-HD  . guaiFENesin  600 mg Oral BID  . heparin  5,000 Units Subcutaneous Q8H  . insulin aspart  0-9 Units Subcutaneous TID WC  . insulin aspart  3 Units Subcutaneous TID WC  . insulin glargine  8 Units Subcutaneous Daily  . pneumococcal 23 valent vaccine  0.5 mL Intramuscular Tomorrow-1000  . rosuvastatin  10 mg Oral q1800

## 2015-10-06 NOTE — Progress Notes (Signed)
Patient ID: Katherine LauthClara P Rittenhouse, female   DOB: 10/21/1933, 82 y.o.   MRN: [id number]  [facility] Progress Note    Assessment:   1. AKI/ CKD 4/ solitary kidney:  prob cardiorenal syndrome. SP HD x 4. 2. Vol excess/ pulm edema- resolved clinically, on room air. CXR improved/ resolved edema.  3. Anemia: sp feraheme x 1 on 4/27 (510 mg, tfs was 5%); started darbe 100/ wk, hold IV fe w nausea 4. Hypertension: bp meds stopped. BP's better 5. DM on insulin 6. Nausea - not improving despite HD x 4, not sure if could be psychogenic? Have d/w primary, he notes per staff that she has not been observed to ever vomit in spite of severe "nausea" for several days.   7. UTI/ fever/ Reina Fuse^wbc - on empiric Rocephin D#2  Plan -  HD tomorrow. Pt refused HD today and tells me "what's the point of doing this if I'm going to feel this bad, I might as well stop". Will d/w primary.    Vinson Moselleob Annalissa Murphey MD [facility] pager [phone]    cell [phone] 10/06/2015, 1:35 PM    Subjective:   As above   Objective:   BP 143/56 mmHg  Pulse 70  Temp(Src) 98.8 F (37.1 C) (Oral)  Resp 16  Ht 5\' 4"  (1.626 m)  Wt 114.3 kg (251 lb 15.8 oz)  BMI 43.23 kg/m2  SpO2 90%  Intake/Output Summary (Last 24 hours) at 10/06/15 1335 Last data filed at 10/05/15 1758  Gross per 24 hour  Intake    120 ml  Output      0 ml  Net    120 ml   Weight change: -7.6 kg (-16 lb 12.1 oz)  Physical Exam: Gen: Resting uncomfortably in bed, :"nauseated" CVS: Pulse sinus rhythm, normal rate, S1 and S2 normal  Resp: Clear lungs bilat, no rales/ rhonchi Abd: Soft, obese, nontender, firm nontender mass midabdomen Ext: 1+ lower extremity edema bilaterally  CXR 4/24 bilat effusions layering, vasc congestion, c/w chf CXR 5/4 resolved effusions/ CHF, still some IS prominence   Imaging: Ct Abdomen Wo Contrast  10/04/2015  CLINICAL DATA:  Nausea. EXAM: CT ABDOMEN WITHOUT CONTRAST TECHNIQUE: Multidetector CT imaging of  the abdomen was performed following the standard protocol without IV contrast. COMPARISON:  CT scan dated 02/03/2009 FINDINGS: Lower chest: There are tiny bilateral pleural effusions with minimal atelectasis at the lung bases. Borderline neck chronic cardiomegaly. Hepatobiliary: Gallbladder has been removed. Liver and biliary tree are otherwise normal. Pancreas: The uncinate process and head of the pancreas are normal. The body and tail of the pancreas are not visible and are most likely severely fatty replaced. This is unchanged since the prior study. Spleen: Normal. Adrenals/Urinary Tract: New bilateral adrenal hyperplasia. The left kidney is small and irregularly calcified and unchanged. Right kidney appears normal with no hydronephrosis. Stomach/Bowel: There are few scattered diverticula in the visualized portion of the left side of the colon. Small hiatal hernia. The visualized portion of the bowel is otherwise normal except for a 2.5 cm diverticulum in the second portion the duodenum adjacent to the uncinate process of pancreas. Vascular/Lymphatic: Aorto bi-iliac atherosclerosis.  No adenopathy. Musculoskeletal: The patient has developed extensive erosions of the iliac side of both sacroiliac joints consistent with sacroiliitis secondary to renal osteodystrophy. There is marked progression of degenerative disc and joint disease in the lumbar spine with erosions of the inferior endplate of L1 which may also be related to renal osteodystrophy. There is a  10.6 x 9.6 x 7.7 cm well-defined fluid collection in the anterior abdominal wall with a slightly enhancing rim and small amount of calcification in the periphery of the lesion. This is at the site of a previous abdominal wall abscess. IMPRESSION: 1. New bilateral adrenal hyperplasia, nonspecific. 2. New extensive bilateral sacroiliitis most likely secondary to renal osteodystrophy. 3. Marked progression of degenerative disc and joint disease in the lumbar spine.  4. Prominent probable chronic fluid collection in the anterior abdominal wall just to the left of midline, probably representing a chronic seroma or chronic abscess. This was the site of an abscess on the prior exam. 5. Small hiatal hernia. Electronically Signed   By: Francene Boyers M.D.   On: 10/04/2015 16:50   Dg Chest Port 1 View  10/05/2015  CLINICAL DATA:  Followup pulmonary edema. History of diabetes, hypertension and sickle cell trait. EXAM: PORTABLE CHEST 1 VIEW COMPARISON:  09/25/2015 FINDINGS: Mild interstitial and bronchovascular prominence is noted in the lungs without overt pulmonary edema. No evidence of pneumonia. Mild elevation the right hemidiaphragm, stable. Mild enlargement of cardiac silhouette, also stable. No mediastinal or hilar masses. Right internal jugular tunneled central venous line. This is new from the prior exam. Distal tip lies in the right atrium. IMPRESSION: 1. Mild cardiomegaly and bronchovascular/interstitial prominence but no overt pulmonary edema. No evidence of pneumonia. Electronically Signed   By: Amie Portland M.D.   On: 10/05/2015 19:30    Labs: BMET  Recent Labs Lab 09/30/15 0535 10/01/15 0535 10/02/15 0535 10/03/15 0515 10/04/15 0458 10/05/15 0431 10/06/15 0653  NA 142 141 141 137 137 139  139 136  K 3.8 3.5 3.4* 4.0 3.9 3.9  3.9 4.4  CL 105 102 100* 98* 99* 99*  99* 96*  CO2 GLUCOSE 155* 171* 247* 256* 248* 152*  154* 104*  BUN 60* 69* 82* 67* 52* 42*  42* 29*  CREATININE 3.37* 3.64* 3.88* 3.88* 3.62* 3.40*  3.42* 2.69*  CALCIUM 8.5* 8.3* 8.5* 8.8* 8.9 9.1  9.0 8.9  PHOS 4.8* 4.9* 5.3* 4.8* 4.1 4.1  4.1 3.9   CBC  Recent Labs Lab 10/03/15 0515 10/04/15 0458 10/05/15 0431 10/06/15 1032  WBC 11.7* 17.2* 17.1* 16.1*  HGB 8.9* 8.7* 8.9* 9.5*  HCT 27.2* 26.3* 27.5* 29.1*  MCV 76.6* 75.8* 77.7* 77.6*  PLT 177 155 142* 161   Medications:    . aspirin EC  81 mg Oral Daily  . cefTRIAXone (ROCEPHIN)  IV   1 g Intravenous Q24H  . darbepoetin (ARANESP) injection - DIALYSIS  100 mcg Intravenous Q Fri-HD  . docusate sodium  200 mg Oral QHS  . escitalopram  5 mg Oral Daily  . ferric gluconate (FERRLECIT/NULECIT) IV  125 mg Intravenous Q M,W,F-HD  . guaiFENesin  600 mg Oral BID  . heparin  5,000 Units Subcutaneous Q8H  . insulin aspart  0-9 Units Subcutaneous TID WC  . insulin aspart  3 Units Subcutaneous TID WC  . insulin glargine  8 Units Subcutaneous Daily  . pneumococcal 23 valent vaccine  0.5 mL Intramuscular Tomorrow-1000  . rosuvastatin  10 mg Oral q1800

## 2015-10-07 ENCOUNTER — Inpatient Hospital Stay (HOSPITAL_COMMUNITY): Payer: Medicare Other

## 2015-10-07 LAB — RENAL FUNCTION PANEL
ALBUMIN: 1.9 g/dL — AB (ref 3.5–5.0)
Anion gap: 17 — ABNORMAL HIGH (ref 5–15)
BUN: 46 mg/dL — AB (ref 6–20)
CO2: 23 mmol/L (ref 22–32)
CREATININE: 3.8 mg/dL — AB (ref 0.44–1.00)
Calcium: 8.7 mg/dL — ABNORMAL LOW (ref 8.9–10.3)
Chloride: 96 mmol/L — ABNORMAL LOW (ref 101–111)
GFR calc Af Amer: 12 mL/min — ABNORMAL LOW (ref >60)
GFR, EST NON AFRICAN AMERICAN: 10 mL/min — AB (ref >60)
GLUCOSE: 129 mg/dL — AB (ref 65–99)
PHOSPHORUS: 4.4 mg/dL (ref 2.5–4.6)
Potassium: 4 mmol/L (ref 3.5–5.1)
SODIUM: 136 mmol/L (ref 135–145)

## 2015-10-07 LAB — GLUCOSE, CAPILLARY
GLUCOSE-CAPILLARY: 144 mg/dL — AB (ref 65–99)
GLUCOSE-CAPILLARY: 149 mg/dL — AB (ref 65–99)
GLUCOSE-CAPILLARY: 151 mg/dL — AB (ref 65–99)
Glucose-Capillary: 169 mg/dL — ABNORMAL HIGH (ref 65–99)

## 2015-10-07 MED ORDER — SODIUM CHLORIDE 0.9% FLUSH: 10.0000 mL | INTRAVENOUS | Status: DC | PRN

## 2015-10-07 MED ORDER — DARBEPOETIN ALFA 100 MCG/0.5ML IJ SOSY
PREFILLED_SYRINGE | INTRAMUSCULAR | Status: AC
  Administered 2015-10-07: 100 ug via INTRAVENOUS
  Filled 2015-10-07: qty 0.5

## 2015-10-07 NOTE — Progress Notes (Signed)
Patient ID: Katherine [name], female   DOB: 1933/10/02, 82 y.o.   MRN: [id number]  [facility] Progress Note    Assessment:   1. AKI/ CKD 4/ solitary kidney:  prob cardiorenal syndrome. SP HD x 4. Refused HD on Friday.  2. Vol excess/ pulm edema- resolved clinically, on room air. CXR improved/ resolved edema.  3. Anemia: sp feraheme x 1 on 4/27 (510 mg, tfs was 5%); started darbe 100/ wk, hold IV fe w nausea 4. Hypertension: bp meds stopped. BP's better 5. DM on insulin 6. Nausea - did not improve despite HD x 4 so this is not uremic 7. UTI/ fever/ Reina Fuse^wbc - on empiric Rocephin D#2  Plan - I'm not sure that patient wants to continue with dialysis.  Multiple times she has stated to me that "If i'm going to feel this bad, what's the point of doing this (dialysis)?". She refused HD yesterday and voiced this same concern. She won't engage in a conversation about anything else with me including EOL issues , dying, hospice , etc. Would suggest palliative care address this. Have d/w primary.     Vinson Moselleob Zyla Dascenzo MD  [facility] pager [phone]    cell [phone] 10/07/2015, 12:15 PM    Subjective:   As above   Objective:   BP 129/67 mmHg  Pulse 81  Temp(Src) 97.7 F (36.5 C) (Oral)  Resp 18  Ht 5\' 4"  (1.626 m)  Wt 116.8 kg (257 lb 8 oz)  BMI 44.18 kg/m2  SpO2 96%  Intake/Output Summary (Last 24 hours) at 10/07/15 1215 Last data filed at 10/07/15 [id number]  Gross per 24 hour  Intake    230 ml  Output      0 ml  Net    230 ml   Weight change: 2.5 kg (5 lb 8.2 oz)  Physical Exam: Gen: Resting uncomfortably in bed, :"nauseated" CVS: Pulse sinus rhythm, normal rate, S1 and S2 normal  Resp: Clear lungs bilat, no rales/ rhonchi Abd: Soft, obese, nontender, firm nontender mass midabdomen Ext: 1+ lower extremity edema bilaterally  CXR 4/24 bilat effusions layering, vasc congestion, c/w chf CXR 5/4 resolved effusions/ CHF, still some IS prominence   Imaging: Dg  Chest Port 1 View  10/07/2015  CLINICAL DATA:  Shortness of breath with nausea and vomiting for 10 days EXAM: PORTABLE CHEST 1 VIEW COMPARISON:  Oct 05, 2015 FINDINGS: Interstitium remains prominent without frank edema or consolidation. There is stable cardiac enlargement with pulmonary vascularity within normal limits. No adenopathy evident. Central catheter tip is at the cavoatrial junction. No pneumothorax. IMPRESSION: Stable cardiac prominence. Mild generalized interstitial prominence is is stable; there is no overt edema or consolidation. The appearance is stable compared to recent prior study. Electronically Signed   By: Bretta BangWilliam  Woodruff III M.D.   On: 10/07/2015 11:18   Dg Chest Port 1 View  10/05/2015  CLINICAL DATA:  Followup pulmonary edema. History of diabetes, hypertension and sickle cell trait. EXAM: PORTABLE CHEST 1 VIEW COMPARISON:  09/25/2015 FINDINGS: Mild interstitial and bronchovascular prominence is noted in the lungs without overt pulmonary edema. No evidence of pneumonia. Mild elevation the right hemidiaphragm, stable. Mild enlargement of cardiac silhouette, also stable. No mediastinal or hilar masses. Right internal jugular tunneled central venous line. This is new from the prior exam. Distal tip lies in the right atrium. IMPRESSION: 1. Mild cardiomegaly and bronchovascular/interstitial prominence but no overt pulmonary edema. No evidence of pneumonia. Electronically Signed   By: Renard Hamperavid  Ormond M.D.  On: 10/05/2015 19:30    Labs: BMET  Recent Labs Lab 10/01/15 0535 10/02/15 0535 10/03/15 0515 10/04/15 0458 10/05/15 0431 10/06/15 0653 10/07/15 0543  NA 141 141 137 137 139  139 136 136  K 3.5 3.4* 4.0 3.9 3.9  3.9 4.4 4.0  CL 102 100* 98* 99* 99*  99* 96* 96*  CO2 GLUCOSE 171* 247* 256* 248* 152*  154* 104* 129*  BUN 69* 82* 67* 52* 42*  42* 29* 46*  CREATININE 3.64* 3.88* 3.88* 3.62* 3.40*  3.42* 2.69* 3.80*  CALCIUM 8.3* 8.5* 8.8* 8.9  9.1  9.0 8.9 8.7*  PHOS 4.9* 5.3* 4.8* 4.1 4.1  4.1 3.9 4.4   CBC  Recent Labs Lab 10/03/15 0515 10/04/15 0458 10/05/15 0431 10/06/15 1032  WBC 11.7* 17.2* 17.1* 16.1*  HGB 8.9* 8.7* 8.9* 9.5*  HCT 27.2* 26.3* 27.5* 29.1*  MCV 76.6* 75.8* 77.7* 77.6*  PLT 177 155 142* 161   Medications:    . aspirin EC  81 mg Oral Daily  . cefTRIAXone (ROCEPHIN)  IV  1 g Intravenous Q24H  . darbepoetin (ARANESP) injection - DIALYSIS  100 mcg Intravenous Q Fri-HD  . docusate sodium  200 mg Oral QHS  . escitalopram  5 mg Oral Daily  . ferric gluconate (FERRLECIT/NULECIT) IV  125 mg Intravenous Q M,W,F-HD  . guaiFENesin  600 mg Oral BID  . heparin  5,000 Units Subcutaneous Q8H  . insulin aspart  0-9 Units Subcutaneous TID WC  . insulin aspart  3 Units Subcutaneous TID WC  . insulin glargine  8 Units Subcutaneous Daily  . pneumococcal 23 valent vaccine  0.5 mL Intramuscular Tomorrow-1000  . rosuvastatin  10 mg Oral q1800

## 2015-10-07 NOTE — Clinical Social Work Note (Signed)
Clinical Social Worker received notification that there was a referral for new SNF placement.  Per the chart, [facility] and no CSW involvement since transition [facility].  Prior to transfer, patient had stated [facility].  Per chart, patient is refusing dialysis, however will not engage in conversation regarding end of life issues - palliative consult ordered.  CSW to follow up with patient and family to further discuss discharge options, once it is determined if patient will continue with dialysis.  CSW remains available for support and to facilitate patient discharge needs once medically stable.  Macario GoldsJesse Hazael Olveda, LCSW (Weekend Coverage) [phone]

## 2015-10-07 NOTE — Progress Notes (Signed)
PROGRESS NOTE  Katherine LauthClara P Schemm ZOX:[id number] DOB: Sep 03, 1933 DOA: 09/25/2015 PCP: Enrique SackGREEN, EDWIN JAY, MD Outpatient Specialists:    LOS: 12 days    Brief Narrative:  Katherine [name] is an 82 y.o. 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 Renal was consulted, patient did not respond to conservative management with high-dose Lasix, and eventually required to be started on dialysis on 5/1 and underwent hemodialysis 3 so far.  Assessment & Plan:   Acute respiratory resp. failure with hypoxia in the setting of acute on chronic diastolic heart failure - due to volume overload from acute diastolic CHF (congestive heart failure) (HCC)/chronic kidney disease stage 3-4/Cardiorenal syndrome, she had dialysis catheter placement by IR and dialysis was started on 10/02/2015. Respiratory status much improved, continue supportive care. Patient now refusing dialysis.    AKI on chronic kidney disease stage IV with a solitary functioning renal kidney - IR Placed temporary right IJ hemodialysis catheter placement 5/1 with initiation of dialysis on 10/02/2015, - Management per nephrology, and is now persistently refusing dialysis, we will have palliative care evaluate the patient for goals of care and CODE STATUS.   Severe nausea - initially felt to be due to renal failure, however this has not improved with hemodialysis, and thus patient underwent a CT scan of the abdomen and pelvis on 5/3. This showed a 10.6 x 9.6 x 7.7 well-defined fluid collection in the anterior abdominal wall.   Previous physician discussed with interventional radiology on 5/4, this appears to be  chronic without any radiological findings of active infection or inflammation, patient states that she has had this fluid for a number of years. She had a chronic fluid collection which was called "abscess" in the past in 2010, however microbiology does not reveal any organisms and patient does not recall being on antibiotics. This appears most likely to be a seroma. Patient denies any pain on exam.    Leukocytosis  - 11>> 17 5/3, afebrile, repeat chest x-ray unremarkable, does have UTI placed on Rocephin on 10/05/2015. Monitor clinically.   Essential HTN (hypertension) - off amlodipine, continue with Coreg and BiDil. - most recent BP 143/56   Dyslipidemia - Continue statins   Morbid obesity (HCC) - Advise to follow low calorie diet and to increase physical activity - Body mass index is 48.68 kg/(m^2   Anemia - Anemia of chronic kidney disease, status post IV iron   Depression  - started on low-dose Lexapro   Abdominal fluid collection - likely chronic, discussed with IR 5/4, it appears without surrounding inflammation and less likely to be infected. Agree with radiology that may be chronic and may not be fully beneficial to drainage at this point.   Possible UTI - Patient without symptoms, however with elevated white count and nausea, favor treating.  - Started on ceftriaxone 5/4, cultures pending   DM (diabetes mellitus) (HCC) uncontrolled - On insulin sliding scale, and NovoLog 3 units before meals, on Lantus from 8  - A1c is 9.0 - Fasting sugar better this morning, 104, continue current regimen  CBG (last 3)   Recent Labs  10/06/15 2029 10/07/15 0840 10/07/15 1148  GLUCAP 129* 144* 169*  DVT prophylaxis: Heparin  Code Status: Full code  Family Communication:  discussed extensively with the patient's daughters 5/4 Disposition Plan: TBD, SNF when ready Barriers for discharge: HD needs  Consultants:    Nephrology   Procedures:    2D echo: EF  55-60%, grade 1 diastolic dysfunction  HD  Foley  Antimicrobials:  Ceftriaxone 5/4 >>  Subjective:  Appears uncomfortable, no headache, no chest or abdominal pain, no shortness of breath.  Objective: Filed Vitals:   10/06/15 2030 10/07/15 0500 10/07/15 0549 10/07/15 0918  BP: 145/58  171/55 129/67  Pulse: 74  80 81  Temp: 98.6 F (37 C)  99 F (37.2 C) 97.7 F (36.5 C)  TempSrc:   Oral Oral  Resp: 16 16 16 18   Height:      Weight:  116.8 kg (257 lb 8 oz)    SpO2: 100%  96% 96%    Intake/Output Summary (Last 24 hours) at 10/07/15 1207 Last data filed at 10/07/15 0918  Gross per 24 hour  Intake    230 ml  Output      0 ml  Net    230 ml   Filed Weights   10/05/15 1226 10/05/15 1607 10/07/15 0500  Weight: 114.3 kg (251 lb 15.8 oz) 114.3 kg (251 lb 15.8 oz) 116.8 kg (257 lb 8 oz)    Examination:  Filed Vitals:   10/06/15 2030 10/07/15 0500 10/07/15 0549 10/07/15 0918  BP: 145/58  171/55 129/67  Pulse: 74  80 81  Temp: 98.6 F (37 C)  99 F (37.2 C) 97.7 F (36.5 C)  TempSrc:   Oral Oral  Resp: 16 16 16 18   Height:      Weight:  116.8 kg (257 lb 8 oz)    SpO2: 100%  96% 96%   Constitutional: NAD Eyes: PERRL, lids and conjunctivae normal ENMT: Mucous membranes are moist. No oropharyngeal exudates Neck: normal, supple, no masses Respiratory: clear to auscultation bilaterally, no wheezing, no crackles. Normal respiratory effort. No accessory muscle use.  Cardiovascular: Regular rate and rhythm, no murmurs / rubs / gallops. No extremity edema. 2+ pedal pulses.  Abdomen: no tenderness. Bowel sounds positive. 10 cm mass anterior aspect  Musculoskeletal: no clubbing / cyanosis.   Neurologic: non focal    Data Reviewed: I have personally reviewed following labs and imaging studies  CBC:  Recent Labs Lab 10/01/15 0838 10/03/15 0515 10/04/15 0458 10/05/15 0431 10/06/15 1032  WBC 12.2* 11.7* 17.2* 17.1* 16.1*  HGB 8.9* 8.9* 8.7* 8.9* 9.5*  HCT 27.3*  27.2* 26.3* 27.5* 29.1*  MCV 78.9 76.6* 75.8* 77.7* 77.6*  PLT 249 177 155 142* 161   Basic Metabolic Panel:  Recent Labs Lab 10/03/15 0515 10/04/15 0458 10/05/15 0431 10/06/15 0653 10/07/15 0543  NA 137 137 139  139 136 136  K 4.0 3.9 3.9  3.9 4.4 4.0  CL 98* 99* 99*  99* 96* 96*  CO2 25 25 26  26 22 23   GLUCOSE 256* 248* 152*  154* 104* 129*  BUN 67* 52* 42*  42* 29* 46*  CREATININE 3.88* 3.62* 3.40*  3.42* 2.69* 3.80*  CALCIUM 8.8* 8.9 9.1  9.0 8.9 8.7*  PHOS 4.8* 4.1 4.1  4.1 3.9 4.4   GFR: Estimated Creatinine Clearance: 15.1 mL/min (by C-G formula based on Cr of 3.8). Liver Function Tests:  Recent Labs Lab 10/03/15 0515 10/04/15 0458 10/05/15 0431 10/06/15 0653 10/07/15 0543  ALBUMIN 2.2* 2.0* 1.9*  1.9* 2.1* 1.9*   No  results for input(s): LIPASE, AMYLASE in the last 168 hours. No results for input(s): AMMONIA in the last 168 hours. Coagulation Profile: No results for input(s): INR, PROTIME in the last 168 hours. Cardiac Enzymes: No results for input(s): CKTOTAL, CKMB, CKMBINDEX, TROPONINI in the last 168 hours. BNP (last 3 results) No results for input(s): PROBNP in the last 8760 hours. HbA1C: No results for input(s): HGBA1C in the last 72 hours. CBG:  Recent Labs Lab 10/06/15 0807 10/06/15 1137 10/06/15 1727 10/06/15 2029 10/07/15 0840  GLUCAP 109* 109* 152* 129* 144*   Lipid Profile: No results for input(s): CHOL, HDL, LDLCALC, TRIG, CHOLHDL, LDLDIRECT in the last 72 hours. Thyroid Function Tests:  Recent Labs  10/06/15 0654  TSH 2.811   Anemia Panel: No results for input(s): VITAMINB12, FOLATE, FERRITIN, TIBC, IRON, RETICCTPCT in the last 72 hours. Urine analysis:    Component Value Date/Time   COLORURINE AMBER* 10/05/2015 1229   APPEARANCEUR TURBID* 10/05/2015 1229   LABSPEC 1.020 10/05/2015 1229   PHURINE 5.0 10/05/2015 1229   GLUCOSEU NEGATIVE 10/05/2015 1229   HGBUR LARGE* 10/05/2015 1229   BILIRUBINUR LARGE*  10/05/2015 1229   KETONESUR 15* 10/05/2015 1229   PROTEINUR >300* 10/05/2015 1229   UROBILINOGEN 1.0 02/11/2014 0913   NITRITE NEGATIVE 10/05/2015 1229   LEUKOCYTESUR LARGE* 10/05/2015 1229   Sepsis Labs:  Invalid input(s): PROCALCITONIN, LACTICIDVEN  No results found for this or any previous visit (from the past 240 hour(s)).   Radiology Studies:  Dg Chest Port 1 View  10/07/2015  CLINICAL DATA:  Shortness of breath with nausea and vomiting for 10 days EXAM: PORTABLE CHEST 1 VIEW COMPARISON:  Oct 05, 2015 FINDINGS: Interstitium remains prominent without frank edema or consolidation. There is stable cardiac enlargement with pulmonary vascularity within normal limits. No adenopathy evident. Central catheter tip is at the cavoatrial junction. No pneumothorax. IMPRESSION: Stable cardiac prominence. Mild generalized interstitial prominence is is stable; there is no overt edema or consolidation. The appearance is stable compared to recent prior study. Electronically Signed   By: Bretta Bang III M.D.   On: 10/07/2015 11:18   Dg Chest Port 1 View  10/05/2015  CLINICAL DATA:  Followup pulmonary edema. History of diabetes, hypertension and sickle cell trait. EXAM: PORTABLE CHEST 1 VIEW COMPARISON:  09/25/2015 FINDINGS: Mild interstitial and bronchovascular prominence is noted in the lungs without overt pulmonary edema. No evidence of pneumonia. Mild elevation the right hemidiaphragm, stable. Mild enlargement of cardiac silhouette, also stable. No mediastinal or hilar masses. Right internal jugular tunneled central venous line. This is new from the prior exam. Distal tip lies in the right atrium. IMPRESSION: 1. Mild cardiomegaly and bronchovascular/interstitial prominence but no overt pulmonary edema. No evidence of pneumonia. Electronically Signed   By: Amie Portland M.D.   On: 10/05/2015 19:30   Scheduled Meds: . aspirin EC  81 mg Oral Daily  . cefTRIAXone (ROCEPHIN)  IV  1 g Intravenous Q24H  .  darbepoetin (ARANESP) injection - DIALYSIS  100 mcg Intravenous Q Fri-HD  . docusate sodium  200 mg Oral QHS  . escitalopram  5 mg Oral Daily  . ferric gluconate (FERRLECIT/NULECIT) IV  125 mg Intravenous Q M,W,F-HD  . guaiFENesin  600 mg Oral BID  . heparin  5,000 Units Subcutaneous Q8H  . insulin aspart  0-9 Units Subcutaneous TID WC  . insulin aspart  3 Units Subcutaneous TID WC  . insulin glargine  8 Units Subcutaneous Daily  . pneumococcal 23 valent vaccine  0.5  mL Intramuscular Tomorrow-1000  . rosuvastatin  10 mg Oral q1800   Continuous Infusions:   Signature  Susa Raring K M.D on 10/07/2015 at 12:08 PM  Between 7am to 7pm - Pager - [phone], After 7pm go to [email or web address] - password [facility]  [facility]  - Office  [phone]

## 2015-10-08 ENCOUNTER — Inpatient Hospital Stay (HOSPITAL_COMMUNITY): Payer: Medicare Other

## 2015-10-08 DIAGNOSIS — Z515 Encounter for palliative care: Secondary | ICD-10-CM | POA: Insufficient documentation

## 2015-10-08 DIAGNOSIS — Z7189 Other specified counseling: Secondary | ICD-10-CM

## 2015-10-08 DIAGNOSIS — N186 End stage renal disease: Secondary | ICD-10-CM | POA: Insufficient documentation

## 2015-10-08 DIAGNOSIS — Z992 Dependence on renal dialysis: Secondary | ICD-10-CM

## 2015-10-08 LAB — CBC
HCT: 29.3 % — ABNORMAL LOW (ref 36.0–46.0)
HEMOGLOBIN: 9.4 g/dL — AB (ref 12.0–15.0)
MCH: 25.3 pg — AB (ref 26.0–34.0)
MCHC: 32.1 g/dL (ref 30.0–36.0)
MCV: 79 fL (ref 78.0–100.0)
Platelets: 176 10*3/uL (ref 150–400)
RBC: 3.71 MIL/uL — AB (ref 3.87–5.11)
RDW: 17.3 % — ABNORMAL HIGH (ref 11.5–15.5)
WBC: 11.8 10*3/uL — ABNORMAL HIGH (ref 4.0–10.5)

## 2015-10-08 LAB — GLUCOSE, CAPILLARY
GLUCOSE-CAPILLARY: 267 mg/dL — AB (ref 65–99)
Glucose-Capillary: 145 mg/dL — ABNORMAL HIGH (ref 65–99)
Glucose-Capillary: 206 mg/dL — ABNORMAL HIGH (ref 65–99)
Glucose-Capillary: 220 mg/dL — ABNORMAL HIGH (ref 65–99)

## 2015-10-08 LAB — RENAL FUNCTION PANEL
ANION GAP: 15 (ref 5–15)
Albumin: 1.9 g/dL — ABNORMAL LOW (ref 3.5–5.0)
BUN: 53 mg/dL — ABNORMAL HIGH (ref 6–20)
CO2: 26 mmol/L (ref 22–32)
CREATININE: 4.24 mg/dL — AB (ref 0.44–1.00)
Calcium: 8.8 mg/dL — ABNORMAL LOW (ref 8.9–10.3)
Chloride: 96 mmol/L — ABNORMAL LOW (ref 101–111)
GFR calc non Af Amer: 9 mL/min — ABNORMAL LOW (ref >60)
GFR, EST AFRICAN AMERICAN: 11 mL/min — AB (ref >60)
GLUCOSE: 142 mg/dL — AB (ref 65–99)
Phosphorus: 4.9 mg/dL — ABNORMAL HIGH (ref 2.5–4.6)
Potassium: 3.9 mmol/L (ref 3.5–5.1)
SODIUM: 137 mmol/L (ref 135–145)

## 2015-10-08 MED ORDER — GI COCKTAIL ~~LOC~~
30.0000 mL | Freq: Three times a day (TID) | ORAL | Status: DC
  Administered 2015-10-08: 30 mL via ORAL
  Filled 2015-10-08: qty 30

## 2015-10-08 MED ORDER — PANTOPRAZOLE SODIUM 40 MG IV SOLR: 40.0000 mg | Freq: Two times a day (BID) | INTRAVENOUS | Status: DC

## 2015-10-08 MED ORDER — SODIUM CHLORIDE 0.9% FLUSH
10.0000 mL | INTRAVENOUS | Status: DC | PRN
  Administered 2015-10-09: 10 mL
  Filled 2015-10-08: qty 40

## 2015-10-08 MED ORDER — PANTOPRAZOLE SODIUM 40 MG PO TBEC
40.0000 mg | DELAYED_RELEASE_TABLET | Freq: Two times a day (BID) | ORAL | Status: DC
  Administered 2015-10-08 – 2015-10-14 (×12): 40 mg via ORAL
  Filled 2015-10-08 (×13): qty 1

## 2015-10-08 NOTE — Progress Notes (Addendum)
Patient ID: Katherine [name], female   DOB: [birth year], 82 y.o.   MRN: [id number]  [facility] Progress Note    Assessment:   1. AKI/ CKD 4/ solitary kidney:  prob cardiorenal syndrome. SP HD x 4. No UOP since starting dialysis. Looks like ESRD.  She has met with palliative team and wants to go ahead w full medical care, including dialysis.  Questions answered.  Will need tunneled cath and perm access prior to d/c, have asked [facility] to see for this.  HD Monday.  Appreciate care of primary team and pall care.  2. Vol excess/ pulm edema- resolved clinically, on room air. CXR improved/ resolved edema.  3. Anemia: sp feraheme x 1 on 4/27 (510 mg, tfs was 5%); started darbe 100/ wk, hold IV fe w nausea 4. Hypertension: bp meds stopped. BP's better 5. DM on insulin 6. Nausea - better today, eating some food 7. UTI/ fever/ [place] - on empiric Rocephin  Plan - HD Monday, [facility] consulted for perm access/ new TDC. Vein map ordered. Will need to ask for CLIP on Monday.     Kelly Splinter MD [facility] pager [phone]    cell [phone] 10/08/2015, 3:08 PM    Subjective:   As above   Objective:   BP 154/72 mmHg  Pulse 87  Temp(Src) 98.7 F (37.1 C) (Oral)  Resp 16  Ht _0  (1.626 m)  Wt 119.5 kg (263 lb 7.2 oz)  BMI 45.20 kg/m2  SpO2 96%  Intake/Output Summary (Last 24 hours) at 10/08/15 1508 Last data filed at 10/08/15 7414  Gross per 24 hour  Intake    290 ml  Output      0 ml  Net    290 ml   Weight change: 2.7 kg (5 lb 15.2 oz)  Physical Exam: Gen: alert, looks much better today, actually eating grapes and pudding CVS: Pulse sinus rhythm, normal rate, S1 and S2 normal  Resp: Clear lungs bilat, no rales/ rhonchi Abd: Soft, obese, nontender, firm nontender mass midabdomen Ext: minimal / trace lower extremity edema bilaterally  CXR 4/24 bilat effusions layering, vasc congestion, c/w chf CXR 5/4 resolved effusions/ CHF, some residual IS  prominence   Imaging: Dg Chest Port 1 View  10/07/2015  CLINICAL DATA:  Shortness of breath with nausea and vomiting for 10 days EXAM: PORTABLE CHEST 1 VIEW COMPARISON:  Oct 05, 2015 FINDINGS: Interstitium remains prominent without frank edema or consolidation. There is stable cardiac enlargement with pulmonary vascularity within normal limits. No adenopathy evident. Central catheter tip is at the cavoatrial junction. No pneumothorax. IMPRESSION: Stable cardiac prominence. Mild generalized interstitial prominence is is stable; there is no overt edema or consolidation. The appearance is stable compared to recent prior study. Electronically Signed   By: Lowella Grip III M.D.   On: 10/07/2015 11:18    Labs: BMET  Recent Labs Lab 10/02/15 0535 10/03/15 0515 10/04/15 0458 10/05/15 0431 10/06/15 0653 10/07/15 0543 10/08/15 0623  NA 141 137 137 139  139 136 136 137  K 3.4* 4.0 3.9 3.9  3.9 4.4 4.0 3.9  CL 100* 98* 99* 99*  99* 96* 96* 96*  CO2 _1 GLUCOSE 247* 256* 248* 152*  154* 104* 129* 142*  BUN 82* 67* 52* 42*  42* 29* 46* 53*  CREATININE 3.88* 3.88* 3.62* 3.40*  3.42* 2.69* 3.80* 4.24*  CALCIUM 8.5* 8.8* 8.9 9.1  9.0 8.9 8.7* 8.8*  PHOS 5.3* 4.8* 4.1 4.1  4.1 3.9 4.4 4.9*   CBC  Recent Labs Lab 10/04/15 0458 10/05/15 0431 10/06/15 1032 10/08/15 0622  WBC 17.2* 17.1* 16.1* 11.8*  HGB 8.7* 8.9* 9.5* 9.4*  HCT 26.3* 27.5* 29.1* 29.3*  MCV 75.8* 77.7* 77.6* 79.0  PLT 155 142* 161 176   Medications:    . aspirin EC  81 mg Oral Daily  . cefTRIAXone (ROCEPHIN)  IV  1 g Intravenous Q24H  . darbepoetin (ARANESP) injection - DIALYSIS  100 mcg Intravenous Q Fri-HD  . docusate sodium  200 mg Oral QHS  . escitalopram  5 mg Oral Daily  . gi cocktail  30 mL Oral TID  . guaiFENesin  600 mg Oral BID  . heparin  5,000 Units Subcutaneous Q8H  . insulin aspart  0-9 Units Subcutaneous TID WC  . insulin aspart  3 Units Subcutaneous TID WC  . insulin  glargine  8 Units Subcutaneous Daily  . pantoprazole  40 mg Oral BID  . pneumococcal 23 valent vaccine  0.5 mL Intramuscular Tomorrow-1000  . rosuvastatin  10 mg Oral q1800

## 2015-10-08 NOTE — Progress Notes (Signed)
PROGRESS NOTE  Katherine [name]:[id number] DOB: 1933/07/27 DOA: 09/25/2015 PCP: [name], MD Outpatient Specialists:    LOS: 13 days    Brief Narrative:  Katherine [name] is an 82 y.o. 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 Renal was consulted, patient did not respond to conservative management with high-dose Lasix, and eventually required to be started on dialysis on 5/1 and underwent hemodialysis 3 so far.  Assessment & Plan:  Acute respiratory resp. failure with hypoxia in the setting of acute on chronic diastolic heart failure EF 55% - due to volume overload from acute diastolic CHF (congestive heart failure) (HCC)/chronic kidney disease stage 3-4/Cardiorenal syndrome, she had dialysis catheter placement by IR and dialysis was started on 10/02/2015. Respiratory status much improved, continue supportive care. Dialysis if patient is agreeable. Echo reviewed.  AKI on chronic kidney disease stage IV with a solitary functioning renal kidney - IR Placed temporary right IJ hemodialysis catheter placement 5/1 with initiation of dialysis on 10/02/2015, - Management per nephrology, patient refused dialysis for 2 days but on 10/08/2015 she says she has changed her mind as she wants to live, have informed renal, will also involve palliative care to evaluate the patient for goals of care and CODE STATUS.  Severe nausea - initially felt to be due to renal failure, however this has not improved with hemodialysis, and thus patient underwent a CT scan of the abdomen and pelvis on 5/3. This showed a 10.6 x 9.6 x 7.7 well-defined fluid collection in the anterior  abdominal wall which appears noninfected and chronic as below.  Continue dialysis in case uremia is causing nausea, place on IV PPI and GI cocktail, if not better we'll consult GI.  Previous physician discussed with interventional radiology on 5/4, this appears to be chronic without any radiological findings of active infection or inflammation, patient states that she has had this fluid for a number of years. She had a chronic fluid collection which was called "abscess" in the past in 2010, however microbiology does not reveal any organisms and patient does not recall being on antibiotics. This appears most likely to be a seroma. Patient denies any pain on exam.   Leukocytosis due to UTI - afebrile, repeat chest x-ray unremarkable, does have UTI placed on Rocephin on 10/05/2015. Monitor clinically.  Essential HTN (hypertension)  - off amlodipine, continue with Coreg and BiDil.  Dyslipidemia- Continue statins  Morbid obesity (HCC)   Advise to follow low calorie diet and to increase physical activity,  Body mass index is 48.68 kg/(m^2  Anemia - Anemia of chronic kidney disease, status post IV iron  Depression - started on low-dose Lexapro  Abdominal fluid collection - likely chronic, discussed with IR 5/4, it appears without surrounding inflammation and less likely to be infected. Agree with radiology that may be chronic and may not be fully beneficial to drainage at this point.  DM (diabetes mellitus) (HCC) uncontrolled -  currently on Lantus, female NovoLog and sliding scale. A1c was 9. Monitor CBGs.  CBG (last 3)   Recent Labs  10/07/15 1649 10/07/15 2202 10/08/15 0805  GLUCAP 149* 151* 145*      DVT prophylaxis: Heparin  Code  Status: Full code  Family Communication:  discussed extensively with the patient's daughters 5/4 Disposition Plan: TBD, SNF when ready Barriers for discharge: HD needs  Consultants:    Nephrology   Procedures:    2D echo: EF 55-60%, grade 1  diastolic dysfunction  HD  Foley  Antimicrobials:  Ceftriaxone 5/4 >>  Subjective:  Appears uncomfortable, no headache, no chest or abdominal pain, no shortness of breath.  Objective: Filed Vitals:   10/07/15 1821 10/07/15 2143 10/08/15 0552 10/08/15 0941  BP: 169/77 144/53 129/54 154/72  Pulse: 85 79 79 87  Temp: 98.1 F (36.7 C) 98.3 F (36.8 C) 98.2 F (36.8 C) 98.7 F (37.1 C)  TempSrc: Oral Oral Oral Oral  Resp: 18 16 14 16   Height:      Weight:   119.5 kg (263 lb 7.2 oz)   SpO2: 96% 99% 96% 96%    Intake/Output Summary (Last 24 hours) at 10/08/15 1141 Last data filed at 10/08/15 0614  Gross per 24 hour  Intake    290 ml  Output      0 ml  Net    290 ml   Filed Weights   10/05/15 1607 10/07/15 0500 10/08/15 0552  Weight: 114.3 kg (251 lb 15.8 oz) 116.8 kg (257 lb 8 oz) 119.5 kg (263 lb 7.2 oz)    Examination:  Filed Vitals:   10/07/15 1821 10/07/15 2143 10/08/15 0552 10/08/15 0941  BP: 169/77 144/53 129/54 154/72  Pulse: 85 79 79 87  Temp: 98.1 F (36.7 C) 98.3 F (36.8 C) 98.2 F (36.8 C) 98.7 F (37.1 C)  TempSrc: Oral Oral Oral Oral  Resp: 18 16 14 16   Height:      Weight:   119.5 kg (263 lb 7.2 oz)   SpO2: 96% 99% 96% 96%   Constitutional: NAD Eyes: PERRL, lids and conjunctivae normal ENMT: Mucous membranes are moist. No oropharyngeal exudates Neck: normal, supple, no masses Respiratory: clear to auscultation bilaterally, no wheezing, no crackles. Normal respiratory effort. No accessory muscle use.  Cardiovascular: Regular rate and rhythm, no murmurs / rubs / gallops. No extremity edema. 2+ pedal pulses.  Abdomen: no tenderness. Bowel sounds positive. 10 cm mass anterior aspect  Musculoskeletal: no clubbing / cyanosis.   Neurologic: non focal    Data Reviewed: I have personally reviewed following labs and imaging studies  CBC:  Recent Labs Lab 10/03/15 0515 10/04/15 0458 10/05/15 0431 10/06/15 1032 10/08/15 0622  WBC 11.7*  17.2* 17.1* 16.1* 11.8*  HGB 8.9* 8.7* 8.9* 9.5* 9.4*  HCT 27.2* 26.3* 27.5* 29.1* 29.3*  MCV 76.6* 75.8* 77.7* 77.6* 79.0  PLT 177 155 142* 161 176   Basic Metabolic Panel:  Recent Labs Lab 10/04/15 0458 10/05/15 0431 10/06/15 0653 10/07/15 0543 10/08/15 0623  NA 137 139  139 136 136 137  K 3.9 3.9  3.9 4.4 4.0 3.9  CL 99* 99*  99* 96* 96* 96*  CO2 25 26  26 22 23 26   GLUCOSE 248* 152*  154* 104* 129* 142*  BUN 52* 42*  42* 29* 46* 53*  CREATININE 3.62* 3.40*  3.42* 2.69* 3.80* 4.24*  CALCIUM 8.9 9.1  9.0 8.9 8.7* 8.8*  PHOS 4.1 4.1  4.1 3.9 4.4 4.9*   GFR: Estimated Creatinine Clearance: 13.7 mL/min (by C-G formula based on Cr of 4.24). Liver Function Tests:  Recent Labs Lab 10/04/15 0458 10/05/15 0431 10/06/15 0653 10/07/15 0543 10/08/15 0623  ALBUMIN 2.0* 1.9*  1.9* 2.1* 1.9* 1.9*  No results for input(s): LIPASE, AMYLASE in the last 168 hours. No results for input(s): AMMONIA in the last 168 hours. Coagulation Profile: No results for input(s): INR, PROTIME in the last 168 hours. Cardiac Enzymes: No results for input(s): CKTOTAL, CKMB, CKMBINDEX, TROPONINI in the last 168 hours. BNP (last 3 results) No results for input(s): PROBNP in the last 8760 hours. HbA1C: No results for input(s): HGBA1C in the last 72 hours. CBG:  Recent Labs Lab 10/07/15 0840 10/07/15 1148 10/07/15 1649 10/07/15 2202 10/08/15 0805  GLUCAP 144* 169* 149* 151* 145*   Lipid Profile: No results for input(s): CHOL, HDL, LDLCALC, TRIG, CHOLHDL, LDLDIRECT in the last 72 hours. Thyroid Function Tests:  Recent Labs  10/06/15 0654  TSH 2.811   Anemia Panel: No results for input(s): VITAMINB12, FOLATE, FERRITIN, TIBC, IRON, RETICCTPCT in the last 72 hours. Urine analysis:    Component Value Date/Time   COLORURINE AMBER* 10/05/2015 1229   APPEARANCEUR TURBID* 10/05/2015 1229   LABSPEC 1.020 10/05/2015 1229   PHURINE 5.0 10/05/2015 1229   GLUCOSEU NEGATIVE  10/05/2015 1229   HGBUR LARGE* 10/05/2015 1229   BILIRUBINUR LARGE* 10/05/2015 1229   KETONESUR 15* 10/05/2015 1229   PROTEINUR >300* 10/05/2015 1229   UROBILINOGEN 1.0 02/11/2014 0913   NITRITE NEGATIVE 10/05/2015 1229   LEUKOCYTESUR LARGE* 10/05/2015 1229   Sepsis Labs:  Invalid input(s): PROCALCITONIN, LACTICIDVEN  No results found for this or any previous visit (from the past 240 hour(s)).   Radiology Studies:  Dg Chest Port 1 View  10/07/2015  CLINICAL DATA:  Shortness of breath with nausea and vomiting for 10 days EXAM: PORTABLE CHEST 1 VIEW COMPARISON:  Oct 05, 2015 FINDINGS: Interstitium remains prominent without frank edema or consolidation. There is stable cardiac enlargement with pulmonary vascularity within normal limits. No adenopathy evident. Central catheter tip is at the cavoatrial junction. No pneumothorax. IMPRESSION: Stable cardiac prominence. Mild generalized interstitial prominence is is stable; there is no overt edema or consolidation. The appearance is stable compared to recent prior study. Electronically Signed   By: Bretta Bang III M.D.   On: 10/07/2015 11:18   Scheduled Meds: . aspirin EC  81 mg Oral Daily  . cefTRIAXone (ROCEPHIN)  IV  1 g Intravenous Q24H  . darbepoetin (ARANESP) injection - DIALYSIS  100 mcg Intravenous Q Fri-HD  . docusate sodium  200 mg Oral QHS  . escitalopram  5 mg Oral Daily  . guaiFENesin  600 mg Oral BID  . heparin  5,000 Units Subcutaneous Q8H  . insulin aspart  0-9 Units Subcutaneous TID WC  . insulin aspart  3 Units Subcutaneous TID WC  . insulin glargine  8 Units Subcutaneous Daily  . pneumococcal 23 valent vaccine  0.5 mL Intramuscular Tomorrow-1000  . rosuvastatin  10 mg Oral q1800    Signature  Susa Raring K M.D on 10/08/2015 at 11:41 AM  Between 7am to 7pm - Pager - [phone], After 7pm go to [email or web address] - password [facility]  [facility]  - Office  [phone]
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
## 2015-10-08 NOTE — Progress Notes (Signed)
Right  Upper Extremity Vein Map    Cephalic  Segment Diameter Depth Comment  1. Axilla 1.442mm 4.326mm   2. Mid upper arm 2.542mm 1.77mm branch  3. Above AC 1.408mm 5.143mm   4. In AC 1.66mm 4.mm   5. Below AC 1.538mm 5.856mm   6. Mid forearm 1.566mm 4.513mm   7. Wrist 1.505mm 7mm    mm mm    mm mm    mm mm    Basilic  Segment Diameter Depth Comment  1. Axilla mm mm   2.Origin 2.274mm 30mm   3. Above AC 2.183mm 20mm   4. In [facility] 1.369mm 13mm   5. Below AC 1.35mm 3.281mm   6. Mid forearm 1mm 10mm   7. Wrist mm mm not visualized   mm mm    mm mm    mm mm    Left Upper Extremity Vein Map    Cephalic  Segment Diameter Depth Comment  1. Axilla mm mm not visualized  2. Mid upper arm 1mm 0.729mm not visualized  3. Above AC 1.111mm 1.43mm   4. In AC 1.551mm 1.611mm   5. Below AC 1.752mm 1mm   6. Mid forearm 1mm 2.595mm   7. Wrist mm mm not visualized   mm mm    mm mm    mm mm    Basilic  Segment Diameter Depth Comment  1.Origin 3.664mm 25mm   2. Mid upper arm 5mm 33mm   3. Above AC 4mm 33mm branch  4. [facility] 2.664[facility] 9mm   5. Below AC 1.76mm 12mm branch  6. Mid forearm 1.753mm 2.313mm   7. Wrist mm mm not visualized   mm mm    mm mm    mm mm

## 2015-10-09 DIAGNOSIS — N185 Chronic kidney disease, stage 5: Secondary | ICD-10-CM

## 2015-10-09 LAB — RENAL FUNCTION PANEL
ALBUMIN: 2 g/dL — AB (ref 3.5–5.0)
ANION GAP: 11 (ref 5–15)
BUN: 56 mg/dL — AB (ref 6–20)
CO2: 26 mmol/L (ref 22–32)
Calcium: 8.6 mg/dL — ABNORMAL LOW (ref 8.9–10.3)
Chloride: 98 mmol/L — ABNORMAL LOW (ref 101–111)
Creatinine, Ser: 4.33 mg/dL — ABNORMAL HIGH (ref 0.44–1.00)
GFR calc Af Amer: 10 mL/min — ABNORMAL LOW (ref >60)
GFR calc non Af Amer: 9 mL/min — ABNORMAL LOW (ref >60)
GLUCOSE: 150 mg/dL — AB (ref 65–99)
PHOSPHORUS: 4.3 mg/dL (ref 2.5–4.6)
POTASSIUM: 3.7 mmol/L (ref 3.5–5.1)
Sodium: 135 mmol/L (ref 135–145)

## 2015-10-09 LAB — GLUCOSE, CAPILLARY
GLUCOSE-CAPILLARY: 130 mg/dL — AB (ref 65–99)
Glucose-Capillary: 158 mg/dL — ABNORMAL HIGH (ref 65–99)
Glucose-Capillary: 160 mg/dL — ABNORMAL HIGH (ref 65–99)
Glucose-Capillary: 149 mg/dL — ABNORMAL HIGH (ref 65–99)

## 2015-10-09 MED ORDER — LIDOCAINE-PRILOCAINE 2.5-2.5 % EX CREA: 1.0000 "application " | TOPICAL_CREAM | CUTANEOUS | Status: DC | PRN

## 2015-10-09 MED ORDER — SODIUM CHLORIDE 0.9 % IV SOLN: 100.0000 mL | INTRAVENOUS | Status: DC | PRN

## 2015-10-09 MED ORDER — ALTEPLASE 2 MG IJ SOLR: 2.0000 mg | Freq: Once | INTRAMUSCULAR | Status: DC | PRN

## 2015-10-09 MED ORDER — HEPARIN SODIUM (PORCINE) 1000 UNIT/ML DIALYSIS: 1000.0000 [IU] | INTRAMUSCULAR | Status: DC | PRN

## 2015-10-09 MED ORDER — HEPARIN SODIUM (PORCINE) 1000 UNIT/ML DIALYSIS: 2400.0000 [IU] | INTRAMUSCULAR | Status: DC | PRN

## 2015-10-09 MED ORDER — METOCLOPRAMIDE HCL 5 MG/ML IJ SOLN
10.0000 mg | Freq: Three times a day (TID) | INTRAMUSCULAR | Status: DC
  Administered 2015-10-09 – 2015-10-14 (×12): 10 mg via INTRAVENOUS
  Filled 2015-10-09 (×12): qty 2

## 2015-10-09 MED ORDER — PROMETHAZINE HCL 25 MG PO TABS
25.0000 mg | ORAL_TABLET | Freq: Two times a day (BID) | ORAL | Status: AC
  Administered 2015-10-09 – 2015-10-10 (×4): 25 mg via ORAL
  Filled 2015-10-09 (×4): qty 1

## 2015-10-09 MED ORDER — PENTAFLUOROPROP-TETRAFLUOROETH EX AERO: 1.0000 "application " | INHALATION_SPRAY | CUTANEOUS | Status: DC | PRN

## 2015-10-09 MED ORDER — LIDOCAINE HCL (PF) 1 % IJ SOLN: 5.0000 mL | INTRAMUSCULAR | Status: DC | PRN
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
## 2015-10-09 NOTE — Progress Notes (Signed)
PROGRESS NOTE  Katherine [name] NWG:[id number] DOB: 18-Feb-1934 DOA: 09/25/2015 PCP: Enrique SackGREEN, EDWIN JAY, MD Outpatient Specialists:    LOS: 14 days    Brief Narrative:  Katherine [name] is an 82 y.o. 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 Renal was consulted, patient did not respond to conservative management with high-dose Lasix, and eventually required to be started on dialysis on 5/1 and underwent hemodialysis 3 so far.  Assessment & Plan:  Acute respiratory resp. failure with hypoxia in the setting of acute on chronic diastolic heart failure EF 55% - due to volume overload from acute diastolic CHF (congestive heart failure) (HCC)/chronic kidney disease stage 3-4/Cardiorenal syndrome, she had dialysis catheter placement by IR and dialysis was started on 10/02/2015. Respiratory status much improved, continue supportive care. Dialysis if patient is agreeable. Echo reviewed.  AKI on chronic kidney disease stage IV with a solitary functioning kidney - IR Placed temporary right IJ hemodialysis catheter placement 5/1 with initiation of dialysis on 10/02/2015, - Management per nephrology, patient refused dialysis for 2 days but on 10/08/2015 she says she has changed her mind as she wants to live, palliative care to evaluate the patient for goals of care and CODE STATUS. Dialysis was resumed on 10/09/2015, likely she is regressing to ESRD. Per renal will require permanent catheter placement by [facility].  Severe nausea - initially felt to be due to renal failure, however this has not improved with hemodialysis, and thus patient underwent a CT scan of the abdomen and  pelvis on 5/3. This showed a 10.6 x 9.6 x 7.7 well-defined fluid collection in the anterior abdominal wall which appears noninfected and chronic as below.  Continue dialysis in case uremia is causing nausea, place on IV PPI and GI cocktail, along with Reglan pre-, patient feels better on 10/09/2015 will continue to monitor.  Previous physician discussed with interventional radiology on 5/4, this appears to be chronic without any radiological findings of active infection or inflammation, patient states that she has had this fluid for a number of years. She had a chronic fluid collection which was called "abscess" in the past in 2010, however microbiology does not reveal any organisms and patient does not recall being on antibiotics. This appears most likely to be a seroma. Patient denies any pain on exam.   Abdominal fluid collection - likely chronic, discussed with IR 5/4, it appears without surrounding inflammation and less likely to be infected. Agree with radiology that may be chronic and may not be fully beneficial to drainage at this point.  Leukocytosis due to UTI - afebrile, repeat chest x-ray unremarkable, does have UTI placed on Rocephin on 10/05/2015 x 3 days. improved clinically.  Essential HTN (hypertension)  - off amlodipine, continue with Coreg and BiDil.  Dyslipidemia- Continue statins  Morbid obesity (HCC)   Advise to follow low calorie diet and to increase physical activity,  Body mass index is 48.68 kg/(m^2  Anemia - Anemia of chronic kidney disease, status post IV iron  Depression - started on low-dose Lexapro  DM (diabetes mellitus) (HCC) uncontrolled -  currently on Lantus, female NovoLog and sliding scale. A1c was 9. Monitor CBGs.  CBG (last 3)   Recent  Labs  10/08/15 1622 10/08/15 2158 10/09/15 0513  GLUCAP 267* 220* 158*      DVT prophylaxis: Heparin  Code Status: Full code  Family Communication:  discussed extensively with the patient's daughters  5/4 Disposition Plan: TBD, SNF when ready Barriers for discharge: HD needs, CLIP  Consultants:    Nephrology   Procedures:    2D echo: EF 55-60%, grade 1 diastolic dysfunction  HD  Foley  Antimicrobials:  Ceftriaxone 5/4 >>5/7  Subjective:  Appears uncomfortable, no headache, no chest or abdominal pain, no shortness of breath.  Objective: Filed Vitals:   10/09/15 0759 10/09/15 0808 10/09/15 0830 10/09/15 0900  BP: 163/76 161/76 157/72 149/73  Pulse: 78 73 77 76  Temp: 97.4 F (36.3 C)     TempSrc: Oral     Resp:      Height:      Weight: 116.1 kg (255 lb 15.3 oz)     SpO2:        Intake/Output Summary (Last 24 hours) at 10/09/15 0932 Last data filed at 10/09/15 0629  Gross per 24 hour  Intake    390 ml  Output      0 ml  Net    390 ml   Filed Weights   10/07/15 0500 10/08/15 0552 10/09/15 0759  Weight: 116.8 kg (257 lb 8 oz) 119.5 kg (263 lb 7.2 oz) 116.1 kg (255 lb 15.3 oz)    Examination:  Filed Vitals:   10/09/15 0759 10/09/15 0808 10/09/15 0830 10/09/15 0900  BP: 163/76 161/76 157/72 149/73  Pulse: 78 73 77 76  Temp: 97.4 F (36.3 C)     TempSrc: Oral     Resp:      Height:      Weight: 116.1 kg (255 lb 15.3 oz)     SpO2:       Constitutional: NAD Eyes: PERRL, lids and conjunctivae normal ENMT: Mucous membranes are moist. No oropharyngeal exudates Neck: normal, supple, no masses Respiratory: clear to auscultation bilaterally, no wheezing, no crackles. Normal respiratory effort. No accessory muscle use.  Cardiovascular: Regular rate and rhythm, no murmurs / rubs / gallops. No extremity edema. 2+ pedal pulses.  Abdomen: no tenderness. Bowel sounds positive. 10 cm mass anterior aspect  Musculoskeletal: no clubbing / cyanosis.   Neurologic: non focal    Data Reviewed: I have personally reviewed following labs and imaging studies  CBC:  Recent Labs Lab 10/03/15 0515 10/04/15 0458 10/05/15 0431 10/06/15 1032 10/08/15 0622  WBC  11.7* 17.2* 17.1* 16.1* 11.8*  HGB 8.9* 8.7* 8.9* 9.5* 9.4*  HCT 27.2* 26.3* 27.5* 29.1* 29.3*  MCV 76.6* 75.8* 77.7* 77.6* 79.0  PLT 177 155 142* 161 176   Basic Metabolic Panel:  Recent Labs Lab 10/05/15 0431 10/06/15 0653 10/07/15 0543 10/08/15 0623 10/09/15 0800  NA 139  139 136 136 137 135  K 3.9  3.9 4.4 4.0 3.9 3.7  CL 99*  99* 96* 96* 96* 98*  CO2 26  26 22 23 26 26   GLUCOSE 152*  154* 104* 129* 142* 150*  BUN 42*  42* 29* 46* 53* 56*  CREATININE 3.40*  3.42* 2.69* 3.80* 4.24* 4.33*  CALCIUM 9.1  9.0 8.9 8.7* 8.8* 8.6*  PHOS 4.1  4.1 3.9 4.4 4.9* 4.3   GFR: Estimated Creatinine Clearance: 13.2 mL/min (by C-G formula based on Cr of 4.33). Liver Function Tests:  Recent Labs Lab 10/05/15 0431 10/06/15 0653 10/07/15 0543 10/08/15 0623 10/09/15 0800  ALBUMIN 1.9*  1.9*  2.1* 1.9* 1.9* 2.0*   No results for input(s): LIPASE, AMYLASE in the last 168 hours. No results for input(s): AMMONIA in the last 168 hours. Coagulation Profile: No results for input(s): INR, PROTIME in the last 168 hours. Cardiac Enzymes: No results for input(s): CKTOTAL, CKMB, CKMBINDEX, TROPONINI in the last 168 hours. BNP (last 3 results) No results for input(s): PROBNP in the last 8760 hours. HbA1C: No results for input(s): HGBA1C in the last 72 hours. CBG:  Recent Labs Lab 10/08/15 0805 10/08/15 1149 10/08/15 1622 10/08/15 2158 10/09/15 0513  GLUCAP 145* 206* 267* 220* 158*   Lipid Profile: No results for input(s): CHOL, HDL, LDLCALC, TRIG, CHOLHDL, LDLDIRECT in the last 72 hours. Thyroid Function Tests: No results for input(s): TSH, T4TOTAL, FREET4, T3FREE, THYROIDAB in the last 72 hours. Anemia Panel: No results for input(s): VITAMINB12, FOLATE, FERRITIN, TIBC, IRON, RETICCTPCT in the last 72 hours. Urine analysis:    Component Value Date/Time   COLORURINE AMBER* 10/05/2015 1229   APPEARANCEUR TURBID* 10/05/2015 1229   LABSPEC 1.020 10/05/2015 1229   PHURINE  5.0 10/05/2015 1229   GLUCOSEU NEGATIVE 10/05/2015 1229   HGBUR LARGE* 10/05/2015 1229   BILIRUBINUR LARGE* 10/05/2015 1229   KETONESUR 15* 10/05/2015 1229   PROTEINUR >300* 10/05/2015 1229   UROBILINOGEN 1.0 02/11/2014 0913   NITRITE NEGATIVE 10/05/2015 1229   LEUKOCYTESUR LARGE* 10/05/2015 1229   Sepsis Labs:  Invalid input(s): PROCALCITONIN, LACTICIDVEN  No results found for this or any previous visit (from the past 240 hour(s)).   Radiology Studies:  Dg Chest Port 1 View  10/07/2015  CLINICAL DATA:  Shortness of breath with nausea and vomiting for 10 days EXAM: PORTABLE CHEST 1 VIEW COMPARISON:  Oct 05, 2015 FINDINGS: Interstitium remains prominent without frank edema or consolidation. There is stable cardiac enlargement with pulmonary vascularity within normal limits. No adenopathy evident. Central catheter tip is at the cavoatrial junction. No pneumothorax. IMPRESSION: Stable cardiac prominence. Mild generalized interstitial prominence is is stable; there is no overt edema or consolidation. The appearance is stable compared to recent prior study. Electronically Signed   By: Bretta Bang III M.D.   On: 10/07/2015 11:18   Scheduled Meds: . aspirin EC  81 mg Oral Daily  . cefTRIAXone (ROCEPHIN)  IV  1 g Intravenous Q24H  . darbepoetin (ARANESP) injection - DIALYSIS  100 mcg Intravenous Q Fri-HD  . docusate sodium  200 mg Oral QHS  . escitalopram  5 mg Oral Daily  . guaiFENesin  600 mg Oral BID  . heparin  5,000 Units Subcutaneous Q8H  . insulin aspart  0-9 Units Subcutaneous TID WC  . insulin aspart  3 Units Subcutaneous TID WC  . insulin glargine  8 Units Subcutaneous Daily  . metoCLOPramide (REGLAN) injection  10 mg Intravenous TID PC  . pantoprazole  40 mg Oral BID  . pneumococcal 23 valent vaccine  0.5 mL Intramuscular Tomorrow-1000  . promethazine  25 mg Oral BID  . rosuvastatin  10 mg Oral q1800    Signature  Susa Raring K M.D on 10/09/2015 at 9:32 AM  Between  7am to 7pm - Pager - [phone], After 7pm go to [email or web address] - password [facility]  [facility]  - Office  [phone]

## 2015-10-09 NOTE — Progress Notes (Signed)
Physical Therapy Treatment Patient Details Name: Katherine [name] MRN: [id number] DOB: 10-Mar-1934 Today's Date: 10/09/2015    History of Present Illness 82 y.o. female with h/o B TKA, HTN, DM, CKD, CHF admitted with fall, increased BLE edema, weakness, SOB. Dx of pulmonary edema.     PT Comments    Agreeable to getting up, even when tired post HD; The RW we used today is not an optimal fit; will try to get a [place] RW (and put on a relatively short height); Showing quite good participation; I anticipate good progress at post-acute rehab   Follow Up Recommendations  SNF;Supervision/Assistance - 24 hour     Equipment Recommendations  Wheelchair (measurements PT)    Recommendations for Other Services OT consult     Precautions / Restrictions Precautions Precautions: Fall Precaution Comments: pt fell just PTA, she denies other falls in the past year Restrictions Weight Bearing Restrictions: No    Mobility  Bed Mobility Overal bed mobility: Needs Assistance Bed Mobility: Supine to Sit     Supine to sit: Mod assist;HOB elevated     General bed mobility comments: Verbal cues for technique.  Repeated verbal cues for hand placement.  Assist to move trunk to upright sitting position.  Once upright, patient able to maintain balance.  Transfers Overall transfer level: Needs assistance Equipment used: Rolling walker (2 wheeled) Transfers: Sit to/from Stand Sit to Stand: Min assist;+2 physical assistance         General transfer comment: Cues for safety and hand placement; dependent on momentum for successful stand; tending to rest forearms on RW  Ambulation/Gait Ambulation/Gait assistance: Min assist;+2 safety/equipment Ambulation Distance (Feet): 3 Feet (pivotal steps bed to chair) Assistive device: Rolling walker (2 wheeled) Gait Pattern/deviations: Shuffle     General Gait Details: a few steps to chair, very tired post HD, tending to rest forearms on RW; moved chair to Ms.  [name] to allow her to sit sooner   Information systems manager [place] (Stroke Patients Only)       Balance     Sitting balance-Leahy Scale: Fair       Standing balance-Leahy Scale: Poor                      Cognition Arousal/Alertness: Awake/alert Behavior During Therapy: WFL for tasks assessed/performed;Flat affect Overall Cognitive Status: Within Functional Limits for tasks assessed                      Exercises      General Comments        Pertinent Vitals/Pain Pain Assessment: Faces Faces Pain Scale: Hurts little more Pain Location: Grimace when donned L sock Pain Descriptors / Indicators: Grimacing Pain Intervention(s): Limited activity within patient's tolerance    Home Living                      Prior Function            PT Goals (current goals can now be found in the care plan section) Acute Rehab PT Goals Patient Stated Goal: to be strong enough to go home PT Goal Formulation: With patient Time For Goal Achievement: 10/10/15 Potential to Achieve Goals: Good Progress towards PT goals: Progressing toward goals (slowly)    Frequency  Min 3X/week    PT Plan Current plan remains appropriate    Co-evaluation  End of Session Equipment Utilized During Treatment: Gait belt;Oxygen Activity Tolerance: Patient limited by fatigue Patient left: in chair;with call bell/phone within reach     Time: [id number] PT Time Calculation (min) (ACUTE ONLY): 18 min  Charges:  $Therapeutic Activity: 8-22 mins                    G Codes:      Olen PelGarrigan, Sameer Teeple Hamff 10/09/2015, 2:33 PM  Van ClinesHolly Aymar Whitfill, [place]  Acute Rehabilitation Services Pager [phone] Office [phone]

## 2015-10-09 NOTE — Progress Notes (Signed)
Utilization review complete. Javanni Maring RN CCM Case Mgmt phone [phone] 

## 2015-10-09 NOTE — Progress Notes (Signed)
Patient ID: Katherine [name], female   DOB: 1934/01/01, 82 y.o.   MRN: [id number]  [facility] Progress Note    Assessment:   1. AKI/ CKD 4/ solitary kidney:  prob cardiorenal syndrome. SP HD x 4. No UOP since starting dialysis. Looks like ESRD.  She has met with palliative team and wants to go ahead w full medical care, including dialysis - I have confirmed that with her today. She understands that it will be her decision down the road whether or not to continue if quality of life not what she wants.  Needs tunneled cath and perm access prior to d/c, I think Dr. Jonnie Finner asked [facility] to see for this but I will confirm.  HD today. Have started CLIP process. Vein mapping has been done.  Appreciate care of primary team and pall care.  2. Vol excess/ pulm edema- resolved clinically, on room air. CXR improved/ resolved edema though still some interstitial prominence.  3. Anemia: sp feraheme x 1 on 4/27 (510 mg, tfs was 5%); started darbe 100/ wk. Anticipate will need additional dosing as tsat was so low. 4. Hypertension: Off all BP meds now 5. DM on insulin 6. Nausea - better today, eating some food but still persistent. Bowels moving OK and abd soft 7. UTI/ fever/ Duncan Dull - on empiric Rocephin. Started on 5/4  Jamal Maes, MD [facility] [phone] Pager 10/09/2015, 8:00 AM       Subjective:   Complains of no appetite - wants "grapes or grape juice" No longer SOB   Objective:   BP 140/52 mmHg  Pulse 82  Temp(Src) 99 F (37.2 C) (Oral)  Resp 18  Ht 5' 4"  (1.626 m)  Wt 119.5 kg (263 lb 7.2 oz)  BMI 45.20 kg/m2  SpO2 88%  Intake/Output Summary (Last 24 hours) at 10/09/15 0755 Last data filed at 10/09/15 9021  Gross per 24 hour  Intake    510 ml  Output      0 ml  Net    510 ml   Weight change:   Physical Exam: Gen: alert, awake, lying flat comfortably CVS: Regular rhythm, normal rate, S1 and S2 normal  Resp: Clear lungs bilat, no rales/ rhonchi Abd:  Soft, obese, nontender, firm nontender mass midabdomen Ext: minimal / trace lower extremity edema bilaterally  CXR 4/24 bilat effusions layering, vasc congestion, c/w chf CXR 5/4 resolved effusions/ CHF, some residual IS prominence   Imaging: Dg Chest Port 1 View  10/07/2015  CLINICAL DATA:  Shortness of breath with nausea and vomiting for 10 days EXAM: PORTABLE CHEST 1 VIEW COMPARISON:  Oct 05, 2015 FINDINGS: Interstitium remains prominent without frank edema or consolidation. There is stable cardiac enlargement with pulmonary vascularity within normal limits. No adenopathy evident. Central catheter tip is at the cavoatrial junction. No pneumothorax. IMPRESSION: Stable cardiac prominence. Mild generalized interstitial prominence is is stable; there is no overt edema or consolidation. The appearance is stable compared to recent prior study. Electronically Signed   By: Lowella Grip III M.D.   On: 10/07/2015 11:18    Labs: BMET  Recent Labs Lab 10/03/15 0515 10/04/15 0458 10/05/15 0431 10/06/15 0653 10/07/15 0543 10/08/15 0623  NA 137 137 139  139 136 136 137  K 4.0 3.9 3.9  3.9 4.4 4.0 3.9  CL 98* 99* 99*  99* 96* 96* 96*  CO2 25 25 26  26 22 23 26   GLUCOSE 256* 248* 152*  154* 104* 129* 142*  BUN  67* 52* 42*  42* 29* 46* 53*  CREATININE 3.88* 3.62* 3.40*  3.42* 2.69* 3.80* 4.24*  CALCIUM 8.8* 8.9 9.1  9.0 8.9 8.7* 8.8*  PHOS 4.8* 4.1 4.1  4.1 3.9 4.4 4.9*   CBC  Recent Labs Lab 10/04/15 0458 10/05/15 0431 10/06/15 1032 10/08/15 0622  WBC 17.2* 17.1* 16.1* 11.8*  HGB 8.7* 8.9* 9.5* 9.4*  HCT 26.3* 27.5* 29.1* 29.3*  MCV 75.8* 77.7* 77.6* 79.0  PLT 155 142* 161 176   Medications:    . aspirin EC  81 mg Oral Daily  . cefTRIAXone (ROCEPHIN)  IV  1 g Intravenous Q24H  . darbepoetin (ARANESP) injection - DIALYSIS  100 mcg Intravenous Q Fri-HD  . docusate sodium  200 mg Oral QHS  . escitalopram  5 mg Oral Daily  . gi cocktail  30 mL Oral TID  . guaiFENesin   600 mg Oral BID  . heparin  5,000 Units Subcutaneous Q8H  . insulin aspart  0-9 Units Subcutaneous TID WC  . insulin aspart  3 Units Subcutaneous TID WC  . insulin glargine  8 Units Subcutaneous Daily  . pantoprazole  40 mg Oral BID  . pneumococcal 23 valent vaccine  0.5 mL Intramuscular Tomorrow-1000  . rosuvastatin  10 mg Oral q1800

## 2015-10-09 NOTE — Care Management Note (Signed)
Case Management Note  Patient Details  Name: Katherine [name] MRN: [id number] Date of Birth: 02-Dec-1933  Subjective/Objective:  ESRD, new Hemodialysis              Action/Plan: Discharge Planning:  NCM spoke to pt and son at bedside. Pt states she plans to go to SNF-rehab but will hire a personal caregiver once she returns home. States she paid out of pocket previously for aide with [facility], she was not sure. CSW referral for SNF placement. Requested information on Renal diet. Unit RN made aware. Will have NCM give list to pt of private duty agencies on 10/10/2015 to review.    Expected Discharge Date:                  Expected Discharge Plan:  Skilled Nursing Facility  In-House Referral:  Clinical Social Work  Discharge planning Services  CM Consult  Post Acute Care Choice:  NA Choice offered to:  NA  DME Arranged:  N/A DME Agency:  NA  HH Arranged:  NA HH Agency:  NA  Status of Service:  Completed, signed off  Medicare Important Message Given:  Yes Date Medicare IM Given:    Medicare IM give by:    Date Additional Medicare IM Given:    Additional Medicare Important Message give by:     If discussed at Long Length of Stay Meetings, dates discussed:    Additional Comments:  Elliot CousinShavis, Gard[place] Ellen, RN 10/09/2015, 8:22 PM

## 2015-10-10 LAB — RENAL FUNCTION PANEL
ANION GAP: 11 (ref 5–15)
ANION GAP: 11 (ref 5–15)
Albumin: 2.1 g/dL — ABNORMAL LOW (ref 3.5–5.0)
Albumin: 2.2 g/dL — ABNORMAL LOW (ref 3.5–5.0)
BUN: 20 mg/dL (ref 6–20)
BUN: 20 mg/dL (ref 6–20)
CO2: 29 mmol/L (ref 22–32)
CO2: 29 mmol/L (ref 22–32)
Calcium: 9 mg/dL (ref 8.9–10.3)
Calcium: 9 mg/dL (ref 8.9–10.3)
Chloride: 100 mmol/L — ABNORMAL LOW (ref 101–111)
Chloride: 100 mmol/L — ABNORMAL LOW (ref 101–111)
Creatinine, Ser: 3.42 mg/dL — ABNORMAL HIGH (ref 0.44–1.00)
Creatinine, Ser: 3.44 mg/dL — ABNORMAL HIGH (ref 0.44–1.00)
GFR calc Af Amer: 14 mL/min — ABNORMAL LOW (ref >60)
GFR calc non Af Amer: 12 mL/min — ABNORMAL LOW (ref >60)
GFR, EST AFRICAN AMERICAN: 14 mL/min — AB (ref >60)
GFR, EST NON AFRICAN AMERICAN: 12 mL/min — AB (ref >60)
GLUCOSE: 131 mg/dL — AB (ref 65–99)
Glucose, Bld: 132 mg/dL — ABNORMAL HIGH (ref 65–99)
POTASSIUM: 4.4 mmol/L (ref 3.5–5.1)
POTASSIUM: 4.4 mmol/L (ref 3.5–5.1)
Phosphorus: 4.1 mg/dL (ref 2.5–4.6)
Phosphorus: 4.1 mg/dL (ref 2.5–4.6)
SODIUM: 140 mmol/L (ref 135–145)
Sodium: 140 mmol/L (ref 135–145)

## 2015-10-10 LAB — GLUCOSE, CAPILLARY
Glucose-Capillary: 159 mg/dL — ABNORMAL HIGH (ref 65–99)
Glucose-Capillary: 139 mg/dL — ABNORMAL HIGH (ref 65–99)
Glucose-Capillary: 217 mg/dL — ABNORMAL HIGH (ref 65–99)
Glucose-Capillary: 255 mg/dL — ABNORMAL HIGH (ref 65–99)

## 2015-10-10 LAB — CBC
HEMATOCRIT: 29.9 % — AB (ref 36.0–46.0)
Hemoglobin: 9.5 g/dL — ABNORMAL LOW (ref 12.0–15.0)
MCH: 25.6 pg — ABNORMAL LOW (ref 26.0–34.0)
MCHC: 31.8 g/dL (ref 30.0–36.0)
MCV: 80.6 fL (ref 78.0–100.0)
PLATELETS: 210 10*3/uL (ref 150–400)
RBC: 3.71 MIL/uL — AB (ref 3.87–5.11)
RDW: 17.7 % — ABNORMAL HIGH (ref 11.5–15.5)
WBC: 11.3 10*3/uL — AB (ref 4.0–10.5)

## 2015-10-10 NOTE — Progress Notes (Signed)
PROGRESS NOTE  Katherine [name] ZOX:[id number] DOB: 1933/11/13 DOA: 09/25/2015 PCP: Enrique SackGREEN, EDWIN JAY, MD Outpatient Specialists:    LOS: 15 days    Brief Narrative:  Katherine [name] is an 82 y.o. 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 Renal was consulted, patient did not respond to conservative management with high-dose Lasix, and eventually required to be started on dialysis on 5/1 .  She subsequently refused. Hemodialysis runs and wanted to become comfort care, she has now changed her mind and as been placed back on hemodialysis. She has had her cyst and nausea and vomiting however after instillation of IV PPI and Reglan pre-meal on 10/08/2015 she is feeling much better on 10/10/2015.   Assessment & Plan:  Acute respiratory resp. failure with hypoxia in the setting of acute on chronic diastolic heart failure EF 55% - due to volume overload from acute diastolic CHF (congestive heart failure) (HCC)/chronic kidney disease stage 3-4/Cardiorenal syndrome, she had dialysis catheter placement by IR and dialysis was started on 10/02/2015. Respiratory status much improved, continue supportive care. Dialysis if patient is agreeable. Echo reviewed.  AKI on chronic kidney disease stage IV with a solitary functioning renal kidney - IR Placed temporary right IJ hemodialysis catheter placement 5/1 with initiation of dialysis on 10/02/2015, - Management per nephrology, patient refused dialysis for 2 days but on 10/08/2015 she now says she has changed her mind as she wants to live, have informed renal, will also involve palliative care to evaluate the patient for goals of  care and CODE STATUS. She currently wants to be full code and now wants to pursue aggressive treatment measures. Dialysis was resumed on 10/09/2015.  Severe nausea - initially felt to be due to renal failure, however this has not improved with hemodialysis, CT scan of the abdomen and pelvis on 5/3. This showed a 10.6 x 9.6 x 7.7 well-defined fluid collection in the anterior abdominal wall which appears noninfected and chronic as below.  Continue dialysis in case uremia is causing nausea, placed on IV PPI along with pre-meal Reglan, this was started on 10/08/2015 with remarkable improvement and on 10/10/2015 she feels she at least feels 60-70% better.  Previous physician discussed with interventional radiology on 5/4, this appears to be chronic without any radiological findings of active infection or inflammation, patient states that she has had this fluid for a number of years. She had a chronic fluid collection which was called "abscess" in the past in 2010, however microbiology does not reveal any organisms and patient does not recall being on antibiotics. This appears most likely to be a seroma. Patient denies any pain on exam.   Leukocytosis due to UTI - afebrile, repeat chest x-ray unremarkable, UTI treated with Rocephin.  Essential HTN (hypertension)  - off amlodipine, continue with Coreg and BiDil.  Dyslipidemia- Continue statins  Morbid obesity (HCC)   Advise to follow low calorie diet and to increase physical activity,  Body mass index is 48.68 kg/(m^2  Anemia - Anemia of chronic kidney disease, status post IV iron.  Depression - started on low-dose Lexapro  Abdominal fluid collection - likely chronic, discussed with IR 5/4, it appears without surrounding inflammation and less likely  to be infected. Agree with radiology that may be chronic and may not be fully beneficial to drainage at this point.  DM (diabetes mellitus) (HCC) uncontrolled -  currently on Lantus, female NovoLog and  sliding scale. A1c was 9. Monitor CBGs.  CBG (last 3)   Recent Labs  10/09/15 1628 10/09/15 2156 10/10/15 0809  GLUCAP 160* 149* 159*      DVT prophylaxis: Heparin  Code Status: Full code  Family Communication:  discussed extensively with the patient's daughters 5/4 Disposition Plan: SNF once outpatient dialysis is arranged. Barriers for discharge: HD needs  Consultants:    Nephrology   Palliative care  Procedures:    2D echo: EF 55-60%, grade 1 diastolic dysfunction  HD  Foley  Anti-infectives    Start     Dose/Rate Route Frequency Ordered Stop   10/05/15 1800  cefTRIAXone (ROCEPHIN) 1 g in dextrose 5 % 50 mL IVPB  Status:  Discontinued     1 g 100 mL/hr over 30 Minutes Intravenous Every 24 hours 10/05/15 1443 10/09/15 0934      Subjective:  Appears uncomfortable, no headache, no chest or abdominal pain, no shortness of breath.  Objective: Filed Vitals:   10/09/15 1310 10/09/15 1633 10/09/15 2154 10/10/15 0457  BP: 131/49 113/50 128/46 123/41  Pulse: 83 81 76 79  Temp: 98.8 F (37.1 C) 97.8 F (36.6 C) 98.3 F (36.8 C) 98.2 F (36.8 C)  TempSrc: Oral Oral Oral Oral  Resp: Height:      Weight:      SpO2: 100% 100% 100% 97%    Intake/Output Summary (Last 24 hours) at 10/10/15 1131 Last data filed at 10/09/15 2115  Gross per 24 hour  Intake    120 ml  Output   2000 ml  Net  -1880 ml   Filed Weights   10/08/15 0552 10/09/15 0759 10/09/15 1203  Weight: 119.5 kg (263 lb 7.2 oz) 116.1 kg (255 lb 15.3 oz) 114.1 kg (251 lb 8.7 oz)    Examination:  Filed Vitals:   10/09/15 1310 10/09/15 1633 10/09/15 2154 10/10/15 0457  BP: 131/49 113/50 128/46 123/41  Pulse: 83 81 76 79  Temp: 98.8 F (37.1 C) 97.8 F (36.6 C) 98.3 F (36.8 C) 98.2 F (36.8 C)  TempSrc: Oral Oral Oral Oral  Resp: Height:      Weight:      SpO2: 100% 100% 100% 97%   Constitutional: NAD Eyes: PERRL, lids and conjunctivae normal ENMT:  Mucous membranes are moist. No oropharyngeal exudates Neck: normal, supple, no masses Respiratory: clear to auscultation bilaterally, no wheezing, no crackles. Normal respiratory effort. No accessory muscle use.  Cardiovascular: Regular rate and rhythm, no murmurs / rubs / gallops. No extremity edema. 2+ pedal pulses.  Abdomen: no tenderness. Bowel sounds positive. 10 cm mass anterior aspect  Musculoskeletal: no clubbing / cyanosis.   Neurologic: non focal    Data Reviewed: I have personally reviewed following labs and imaging studies  CBC:  Recent Labs Lab 10/04/15 0458 10/05/15 0431 10/06/15 1032 10/08/15 0622 10/10/15 0703  WBC 17.2* 17.1* 16.1* 11.8* 11.3*  HGB 8.7* 8.9* 9.5* 9.4* 9.5*  HCT 26.3* 27.5* 29.1* 29.3* 29.9*  MCV 75.8* 77.7* 77.6* 79.0 80.6  PLT 155 142* 161 176 210   Basic Metabolic Panel:  Recent Labs Lab 10/06/15 0653 10/07/15 0543 10/08/15 0623 10/09/15 0800 10/10/15 0703  NA 136 136 137 135 140  140  K  4.4 4.0 3.9 3.7 4.4  4.4  CL 96* 96* 96* 98* 100*  100*  CO2 22 23 26 26 29  29   GLUCOSE 104* 129* 142* 150* 132*  131*  BUN 29* 46* 53* 56* 20  20  CREATININE 2.69* 3.80* 4.24* 4.33* 3.44*  3.42*  CALCIUM 8.9 8.7* 8.8* 8.6* 9.0  9.0  PHOS 3.9 4.4 4.9* 4.3 4.1  4.1   GFR: Estimated Creatinine Clearance: 16.5 mL/min (by C-G formula based on Cr of 3.42). Liver Function Tests:  Recent Labs Lab 10/06/15 0653 10/07/15 0543 10/08/15 0623 10/09/15 0800 10/10/15 0703  ALBUMIN 2.1* 1.9* 1.9* 2.0* 2.1*  2.2*   No results for input(s): LIPASE, AMYLASE in the last 168 hours. No results for input(s): AMMONIA in the last 168 hours. Coagulation Profile: No results for input(s): INR, PROTIME in the last 168 hours. Cardiac Enzymes: No results for input(s): CKTOTAL, CKMB, CKMBINDEX, TROPONINI in the last 168 hours. BNP (last 3 results) No results for input(s): PROBNP in the last 8760 hours. HbA1C: No results for input(s): HGBA1C in the  last 72 hours. CBG:  Recent Labs Lab 10/09/15 0513 10/09/15 1309 10/09/15 1628 10/09/15 2156 10/10/15 0809  GLUCAP 158* 130* 160* 149* 159*   Lipid Profile: No results for input(s): CHOL, HDL, LDLCALC, TRIG, CHOLHDL, LDLDIRECT in the last 72 hours. Thyroid Function Tests: No results for input(s): TSH, T4TOTAL, FREET4, T3FREE, THYROIDAB in the last 72 hours. Anemia Panel: No results for input(s): VITAMINB12, FOLATE, FERRITIN, TIBC, IRON, RETICCTPCT in the last 72 hours. Urine analysis:    Component Value Date/Time   COLORURINE AMBER* 10/05/2015 1229   APPEARANCEUR TURBID* 10/05/2015 1229   LABSPEC 1.020 10/05/2015 1229   PHURINE 5.0 10/05/2015 1229   GLUCOSEU NEGATIVE 10/05/2015 1229   HGBUR LARGE* 10/05/2015 1229   BILIRUBINUR LARGE* 10/05/2015 1229   KETONESUR 15* 10/05/2015 1229   PROTEINUR >300* 10/05/2015 1229   UROBILINOGEN 1.0 02/11/2014 0913   NITRITE NEGATIVE 10/05/2015 1229   LEUKOCYTESUR LARGE* 10/05/2015 1229   Sepsis Labs:  Invalid input(s): PROCALCITONIN, LACTICIDVEN  No results found for this or any previous visit (from the past 240 hour(s)).   Radiology Studies:  No results found. Scheduled Meds: . aspirin EC  81 mg Oral Daily  . darbepoetin (ARANESP) injection - DIALYSIS  100 mcg Intravenous Q Fri-HD  . docusate sodium  200 mg Oral QHS  . escitalopram  5 mg Oral Daily  . guaiFENesin  600 mg Oral BID  . heparin  5,000 Units Subcutaneous Q8H  . insulin aspart  0-9 Units Subcutaneous TID WC  . insulin aspart  3 Units Subcutaneous TID WC  . insulin glargine  8 Units Subcutaneous Daily  . metoCLOPramide (REGLAN) injection  10 mg Intravenous TID PC  . pantoprazole  40 mg Oral BID  . pneumococcal 23 valent vaccine  0.5 mL Intramuscular Tomorrow-1000  . promethazine  25 mg Oral BID  . rosuvastatin  10 mg Oral q1800    Signature  Susa Raring K M.D on 10/10/2015 at 11:31 AM  Between 7am to 7pm - Pager - [phone], After 7pm go to [email or web address]  - password [facility] [address] [address]  [facility]  - Office  [phone]

## 2015-10-10 NOTE — Progress Notes (Signed)
Patient ID: Katherine [name], female   DOB: Nov 01, 1933, 82 y.o.   MRN: [id number]  [facility] Progress Note    Subjective:   Starting to get her appetite back. Eating lunch Has agreed to proceed with AVF/AVG and dialysis "for now"   Objective:   BP 123/41 mmHg  Pulse 79  Temp(Src) 98.2 F (36.8 C) (Oral)  Resp 17  Ht _0  (1.626 m)  Wt 114.1 kg (251 lb 8.7 oz)  BMI 43.16 kg/m2  SpO2 97%  Intake/Output Summary (Last 24 hours) at 10/10/15 1415 Last data filed at 10/09/15 2115  Gross per 24 hour  Intake     10 ml  Output      0 ml  Net     10 ml   Weight change:   Physical Exam: Gen: alert, awake, lying flat comfortably CVS: Regular rhythm, normal rate, S1 and S2 normal  Resp: Clear lungs bilat, no rales/ rhonchi Abd: Soft, obese, nontender, firm nontender mass midabdomen Ext: minimal / trace lower extremity edema bilaterally  CXR 4/24 bilat effusions layering, vasc congestion, c/w chf CXR 5/4 resolved effusions/ CHF, some residual IS prominence   Imaging: No results found.  Labs: BMET  Recent Labs Lab 10/04/15 0458 10/05/15 0431 10/06/15 0653 10/07/15 0543 10/08/15 0623 10/09/15 0800 10/10/15 0703  NA 137 139  139 136 136 137 135 140  140  K 3.9 3.9  3.9 4.4 4.0 3.9 3.7 4.4  4.4  CL 99* 99*  99* 96* 96* 96* 98* 100*  100*  CO2 _1 GLUCOSE 248* 152*  154* 104* 129* 142* 150* 132*  131*  BUN 52* 42*  42* 29* 46* 53* 56* 20  20  CREATININE 3.62* 3.40*  3.42* 2.69* 3.80* 4.24* 4.33* 3.44*  3.42*  CALCIUM 8.9 9.1  9.0 8.9 8.7* 8.8* 8.6* 9.0  9.0  PHOS 4.1 4.1  4.1 3.9 4.4 4.9* 4.3 4.1  4.1   CBC  Recent Labs Lab 10/05/15 0431 10/06/15 1032 10/08/15 0622 10/10/15 0703  WBC 17.1* 16.1* 11.8* 11.3*  HGB 8.9* 9.5* 9.4* 9.5*  HCT 27.5* 29.1* 29.3* 29.9*  MCV 77.7* 77.6* 79.0 80.6  PLT 142* 161 176 210   Medications:    . aspirin EC  81 mg Oral Daily  . darbepoetin (ARANESP) injection -  DIALYSIS  100 mcg Intravenous Q Fri-HD  . docusate sodium  200 mg Oral QHS  . escitalopram  5 mg Oral Daily  . guaiFENesin  600 mg Oral BID  . heparin  5,000 Units Subcutaneous Q8H  . insulin aspart  0-9 Units Subcutaneous TID WC  . insulin aspart  3 Units Subcutaneous TID WC  . insulin glargine  8 Units Subcutaneous Daily  . metoCLOPramide (REGLAN) injection  10 mg Intravenous TID PC  . pantoprazole  40 mg Oral BID  . pneumococcal 23 valent vaccine  0.5 mL Intramuscular Tomorrow-1000  . promethazine  25 mg Oral BID  . rosuvastatin  10 mg Oral q1800   Background 82 y.o. year-old AAF with DM, HTN, HLD, known CKD with solitary functioning kidney who has AKI on CKD in the setting of acute diastolic heart failure. She was seen by Dr. Moshe Cipro 07/2015 for an upward trending of creatinine (see table below). Pt was on losartan, and was taking Aleve which she was asked to stop. Lasix 40 mg BID was added for edema. When labs were rechecked a month later, creatinine  was up to 3.22 and she was then asked to stop her lasix, losartan. Creatinine after that change dropped to 1.95 but when she stopped the furosemide, she started swelling with development of pitting edema to the thighs, and became more SOB. She had a fall at home on 4/24 and called EMS for help getting up - noted to be SOB with low O2 sats so was brought to the hospital and admitted for treatment of CHF. She did not respond to ceiling dose diuretics so was transferred from [facility] for dialysis. 1st TMT 5/1, then had some hesitation about continuing, has met with Palliative Care and is agreeable to proceed with access placement  Assessment:   1. AKI/ CKD 4/ solitary functioning kidney->new ESRD: tipped over into ESRD by cardiorenal syndrome. SP HD x 6. Minimal UOP since starting dialysis. She has met with palliative team and wants to go ahead w full medical care, including dialysis - I have confirmed that with her again today. She understands that  it will be her decision down the road whether or not to continue if quality of life not what she wants.  Needs tunneled cath and perm access prior to d/c, [facility] has been consulted for [facility] and AVF/AVG.  HD tomorrow (doing MWF pending outpt schedule) . Have started CLIP process.   2. Vol excess/ pulm edema- resolved clinically, on room air. CXR improved. Still with LE edema on exam today  3. CHF - much improved with dialysis and volume removal. Weight down 13 kg from highest weight in the hospital.  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 Check PTH.  5. Anemia: sp feraheme x 1 on 4/27 (510 mg, tfs was 5%); started darbe 100/ wk.  6. Hypertension: Off all BP meds now 7. DM on insulin 8. Nausea - better today, eating solid food with better appetite. Bowels moving OK and abd soft. On reglan IV ac. 9. UTI/ fever/ Duncan Dull - completed a course of rocephin  Jamal Maes, MD [place] Pager 10/10/2015, 2:15 PM

## 2015-10-11 DIAGNOSIS — N186 End stage renal disease: Secondary | ICD-10-CM

## 2015-10-11 DIAGNOSIS — Z992 Dependence on renal dialysis: Secondary | ICD-10-CM

## 2015-10-11 LAB — RENAL FUNCTION PANEL
ALBUMIN: 2.1 g/dL — AB (ref 3.5–5.0)
ANION GAP: 10 (ref 5–15)
BUN: 28 mg/dL — ABNORMAL HIGH (ref 6–20)
CALCIUM: 8.8 mg/dL — AB (ref 8.9–10.3)
CO2: 29 mmol/L (ref 22–32)
Chloride: 100 mmol/L — ABNORMAL LOW (ref 101–111)
Creatinine, Ser: 4.68 mg/dL — ABNORMAL HIGH (ref 0.44–1.00)
GFR, EST AFRICAN AMERICAN: 9 mL/min — AB (ref >60)
GFR, EST NON AFRICAN AMERICAN: 8 mL/min — AB (ref >60)
Glucose, Bld: 144 mg/dL — ABNORMAL HIGH (ref 65–99)
PHOSPHORUS: 4.4 mg/dL (ref 2.5–4.6)
Potassium: 4.2 mmol/L (ref 3.5–5.1)
SODIUM: 139 mmol/L (ref 135–145)

## 2015-10-11 LAB — CBC
HCT: 28.7 % — ABNORMAL LOW (ref 36.0–46.0)
HEMATOCRIT: 30.2 % — AB (ref 36.0–46.0)
HEMOGLOBIN: 9.3 g/dL — AB (ref 12.0–15.0)
Hemoglobin: 9 g/dL — ABNORMAL LOW (ref 12.0–15.0)
MCH: 25.4 pg — ABNORMAL LOW (ref 26.0–34.0)
MCH: 24.5 pg — ABNORMAL LOW (ref 26.0–34.0)
MCHC: 31.4 g/dL (ref 30.0–36.0)
MCHC: 30.8 g/dL (ref 30.0–36.0)
MCV: 81.1 fL (ref 78.0–100.0)
MCV: 79.7 fL (ref 78.0–100.0)
Platelets: 217 10*3/uL (ref 150–400)
Platelets: 212 10*3/uL (ref 150–400)
RBC: 3.54 MIL/uL — ABNORMAL LOW (ref 3.87–5.11)
RBC: 3.79 MIL/uL — ABNORMAL LOW (ref 3.87–5.11)
RDW: 17.9 % — ABNORMAL HIGH (ref 11.5–15.5)
RDW: 17.8 % — ABNORMAL HIGH (ref 11.5–15.5)
WBC: 10.1 10*3/uL (ref 4.0–10.5)
WBC: 12 10*3/uL — AB (ref 4.0–10.5)

## 2015-10-11 LAB — GLUCOSE, CAPILLARY
GLUCOSE-CAPILLARY: 135 mg/dL — AB (ref 65–99)
GLUCOSE-CAPILLARY: 204 mg/dL — AB (ref 65–99)
Glucose-Capillary: 180 mg/dL — ABNORMAL HIGH (ref 65–99)

## 2015-10-11 MED ORDER — CEFAZOLIN SODIUM 1-5 GM-% IV SOLN
1.0000 g | INTRAVENOUS | Status: AC
  Administered 2015-10-12: 2 g via INTRAVENOUS
  Filled 2015-10-11: qty 50

## 2015-10-11 MED ORDER — ALBUMIN HUMAN 25 % IV SOLN
12.5000 g | Freq: Once | INTRAVENOUS | Status: AC
  Administered 2015-10-11: 12.5 g via INTRAVENOUS

## 2015-10-11 NOTE — Progress Notes (Signed)
Patient ID: Katherine [name], female   DOB: 02-Jul-1933, 82 y.o.   MRN: [id number]  [facility] Progress Note    Subjective:   Eating much better with addition of IV reglan On HD right now BP down   Objective:   BP 91/51 mmHg  Pulse 76  Temp(Src) 98.4 F (36.9 C) (Oral)  Resp 18  Ht _0  (1.626 m)  Wt 113.1 kg (249 lb 5.4 oz)  BMI 42.78 kg/m2  SpO2 99%  Intake/Output Summary (Last 24 hours) at 10/11/15 0850 Last data filed at 10/11/15 0600  Gross per 24 hour  Intake    960 ml  Output      0 ml  Net    960 ml   Weight change: -2.701 kg (-5 lb 15.3 oz)  Physical Exam: Gen: alert, awake, lying flat comfortably CVS: Regular rhythm, normal rate, S1 and S2 normal  Resp: Clear lungs bilat, no rales/ rhonchi Abd: Soft, obese, nontender, firm nontender mass midabdomen Ext: minimal / trace lower extremity edema bilaterally Marked area RLE (from fall PTA) still with some nodular swelling Temp HD cath R chest in use  CXR 4/24 bilat effusions layering, vasc congestion, c/w chf CXR 5/4 resolved effusions/ CHF, some residual IS prominence   Recent Labs Lab 10/05/15 0431 10/06/15 0653 10/07/15 0543 10/08/15 0623 10/09/15 0800 10/10/15 0703 10/11/15 0723  NA 139  139 136 136 137 135 140  140 139  K 3.9  3.9 4.4 4.0 3.9 3.7 4.4  4.4 4.2  CL 99*  99* 96* 96* 96* 98* 100*  100* 100*  CO2 _1 GLUCOSE 152*  154* 104* 129* 142* 150* 132*  131* 144*  BUN 42*  42* 29* 46* 53* 56* 20  20 28*  CREATININE 3.40*  3.42* 2.69* 3.80* 4.24* 4.33* 3.44*  3.42* 4.68*  CALCIUM 9.1  9.0 8.9 8.7* 8.8* 8.6* 9.0  9.0 8.8*  PHOS 4.1  4.1 3.9 4.4 4.9* 4.3 4.1  4.1 4.4     Recent Labs Lab 10/06/15 1032 10/08/15 0622 10/10/15 0703 10/11/15 0724  WBC 16.1* 11.8* 11.3* 10.1  HGB 9.5* 9.4* 9.5* 9.0*  HCT 29.1* 29.3* 29.9* 28.7*  MCV 77.6* 79.0 80.6 81.1  PLT 161 176 210 217   Medications:    . albumin human  12.5 g Intravenous  Once  . aspirin EC  81 mg Oral Daily  . darbepoetin (ARANESP) injection - DIALYSIS  100 mcg Intravenous Q Fri-HD  . docusate sodium  200 mg Oral QHS  . escitalopram  5 mg Oral Daily  . guaiFENesin  600 mg Oral BID  . heparin  5,000 Units Subcutaneous Q8H  . insulin aspart  0-9 Units Subcutaneous TID WC  . insulin aspart  3 Units Subcutaneous TID WC  . insulin glargine  8 Units Subcutaneous Daily  . metoCLOPramide (REGLAN) injection  10 mg Intravenous TID PC  . pantoprazole  40 mg Oral BID  . pneumococcal 23 valent vaccine  0.5 mL Intramuscular Tomorrow-1000  . rosuvastatin  10 mg Oral q1800   Background 82 y.o. year-old AAF with DM, HTN, HLD, known CKD with solitary functioning kidney who has AKI on CKD in the setting of acute diastolic heart failure. She was seen by Dr. Moshe Cipro 07/2015 for an upward trending of creatinine (see table below). Pt was on losartan, and was taking Aleve which she was asked to stop. Lasix 40 mg BID was added  for edema. When labs were rechecked a month later, creatinine was up to 3.22 and she was then asked to stop her lasix, losartan. Creatinine after that change dropped to 1.95 but when she stopped the furosemide, she started swelling with development of pitting edema to the thighs, and became more SOB. She had a fall at home on 4/24 and called EMS for help getting up - noted to be SOB with low O2 sats so was brought to the hospital and admitted for treatment of CHF. She did not respond to ceiling dose diuretics so was transferred from [facility] for dialysis. 1st TMT 5/1, then had some hesitation about continuing, has met with Palliative Care and is agreeable to proceed with access placement  Assessment:   1. AKI/ CKD 4/ solitary functioning kidney->new ESRD: tipped over into ESRD by cardiorenal syndrome. Has done well with HD. Minimal UOP since starting dialysis. She has met with palliative team is moving ahead w full medical care, including dialysis. She understands  that it will be her decision down the road whether or not to continue if quality of life not what she wants.  Needs tunneled cath and perm access prior to d/c, [facility] has been consulted for [facility] and AVF/AVG.  Anticipate access on Thursday.  HD today (doing MWF pending outpt schedule) . Have started CLIP process but still no word on her outpt assignment. BP low on HD today - may be at EDW. Pre HD was around 113.1 kg. 2. Vol excess/ pulm edema- resolved clinically, on room air. CXR improved. Still with LE edema on exam but BP wiol limit much furrther UF.  3. CHF - much improved with dialysis and volume removal. Weight down 13 kg from highest weight in the hospital.  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 Check PTH (sent from HD today).  5. Anemia: sp feraheme x 1 on 4/27 (510 mg, tfs was 5%); started darbe 100/ wk. QFriday 6. Hypertension: Off all BP meds now 7. DM on insulin 8. Nausea - Marked improvement with Reglan. Still getting IV - ? Transition to po? 9. UTI/ fever/ [place] Dull - completed a course of rocephin. WBC normal now.  Jamal Maes, MD [facility] [phone] Pager 10/10/2015, 2:15 PM

## 2015-10-11 NOTE — Progress Notes (Signed)
PROGRESS NOTE  Katherine [name] ZOX:[id number] DOB: [birth year] DOA: 09/25/2015 PCP: [name], MD Outpatient Specialists:    LOS: 16 days    Brief Narrative:  Katherine [name] is an 82 y.o. 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 Renal was consulted, patient did not respond to conservative management with high-dose Lasix, and eventually required to be started on dialysis on 5/1 .  She subsequently refused. Hemodialysis runs and wanted to become comfort care, she has now changed her mind and as been placed back on hemodialysis. She has had her cyst and nausea and vomiting however after instillation of IV PPI and Reglan pre-meal on 10/08/2015 she is feeling much better on 10/10/2015.   Assessment & Plan:  Acute respiratory resp. failure with hypoxia in the setting of acute on chronic diastolic heart failure EF 55% - due to volume overload from acute diastolic CHF (congestive heart failure) (HCC)/chronic kidney disease stage 3-4/Cardiorenal syndrome, she had dialysis catheter placement by IR and dialysis was started on 10/02/2015. Respiratory status much improved, continue supportive care. Echo reviewed.  AKI on chronic kidney disease stage IV with a solitary functioning renal kidney - IR Placed temporary right IJ hemodialysis catheter placement 5/1 with initiation of dialysis on 10/02/2015, - Management per nephrology, patient refused dialysis for 2 days but on 10/08/2015 she now says she has changed her mind as she wants to live, have informed renal, will also involve palliative care to evaluate the patient for goals of care and CODE STATUS. She currently  wants to be full code and now wants to pursue aggressive treatment measures. Dialysis was resumed on 10/09/2015.  Severe nausea - initially felt to be due to renal failure, however this has not improved with hemodialysis, CT scan of the abdomen and pelvis on 5/3. This showed a 10.6 x 9.6 x 7.7 well-defined fluid collection in the anterior abdominal wall which appears noninfected and chronic as below.  Continue dialysis in case uremia is causing nausea, placed on IV PPI along with pre-meal Reglan, this was started on 10/08/2015 with remarkable improvement and on 10/10/2015 she feels she at least feels 60-70% better.  Previous physician discussed with interventional radiology on 5/4, this appears to be chronic without any radiological findings of active infection or inflammation, patient states that she has had this fluid for a number of years. She had a chronic fluid collection which was called "abscess" in the past in 2010, however microbiology does not reveal any organisms and patient does not recall being on antibiotics. This appears most likely to be a seroma. Patient denies any pain on exam.   Leukocytosis due to UTI - afebrile, repeat chest x-ray unremarkable, UTI treated with Rocephin.  Essential HTN (hypertension)  - off amlodipine, continue with Coreg and BiDil.  Dyslipidemia- Continue statins  Morbid obesity (HCC)   Advise to follow low calorie diet and to increase physical activity,  Body mass index is 48.68 kg/(m^2  Anemia - Anemia of chronic kidney disease, status post IV iron.  Depression - started on low-dose Lexapro  Abdominal fluid collection - likely chronic, discussed with IR 5/4, it appears without surrounding inflammation and less likely to be infected. Agree with  radiology that may be chronic and may not be fully beneficial to drainage at this point.  DM (diabetes mellitus) (HCC) uncontrolled -  currently on Lantus, female NovoLog and sliding scale. A1c was 9. Monitor  CBGs.  CBG (last 3)   Recent Labs  10/10/15 1629 10/10/15 2151 10/11/15 1131  GLUCAP 217* 255* 135*      DVT prophylaxis: Heparin  Code Status: Full code  Family Communication:  None at bedside Disposition Plan: SNF once outpatient dialysis is arranged. Barriers for discharge: HD needs  Consultants:    Nephrology   Palliative care  Procedures:    2D echo: EF 55-60%, grade 1 diastolic dysfunction  HD  Foley  Anti-infectives    Start     Dose/Rate Route Frequency Ordered Stop   10/12/15 1000  ceFAZolin (ANCEF) IVPB 1 g/50 mL premix    Comments:  Send with pt to OR   1 g 100 mL/hr over 30 Minutes Intravenous To [facility] 10/11/15 0945 10/13/15 1000   10/05/15 1800  cefTRIAXone (ROCEPHIN) 1 g in dextrose 5 % 50 mL IVPB  Status:  Discontinued     1 g 100 mL/hr over 30 Minutes Intravenous Every 24 hours 10/05/15 1443 10/09/15 0934      Subjective:  Denies any complaints, no headache, no chest or abdominal pain, no shortness of breath.  Objective: Filed Vitals:   10/11/15 0930 10/11/15 1000 10/11/15 1030 10/11/15 1057  BP: 117/65 88/59 91/58  106/59  Pulse: 79 83 82 86  Temp:    97.9 F (36.6 C)  TempSrc:    Oral  Resp:      Height:      Weight:    111.5 kg (245 lb 13 oz)  SpO2:    99%    Intake/Output Summary (Last 24 hours) at 10/11/15 1417 Last data filed at 10/11/15 1057  Gross per 24 hour  Intake    480 ml  Output   1481 ml  Net  -1001 ml   Filed Weights   10/10/15 2153 10/11/15 0649 10/11/15 1057  Weight: 113.399 kg (250 lb) 113.1 kg (249 lb 5.4 oz) 111.5 kg (245 lb 13 oz)    Examination:  Filed Vitals:   10/11/15 0930 10/11/15 1000 10/11/15 1030 10/11/15 1057  BP: 117/65 88/59 91/58  106/59  Pulse: 79 83 82 86  Temp:    97.9 F (36.6 C)  TempSrc:    Oral  Resp:      Height:      Weight:    111.5 kg (245 lb 13 oz)  SpO2:    99%   Constitutional: NAD Eyes: PERRL, lids and conjunctivae normal ENMT: Mucous membranes are  moist. No oropharyngeal exudates Neck: normal, supple, no masses Respiratory: clear to auscultation bilaterally, no wheezing, no crackles. Normal respiratory effort. No accessory muscle use.  Cardiovascular: Regular rate and rhythm, no murmurs / rubs / gallops. No extremity edema. 2+ pedal pulses.  Abdomen: no tenderness. Bowel sounds positive. 10 cm mass anterior aspect  Musculoskeletal: no clubbing / cyanosis.   Neurologic: non focal    Data Reviewed: I have personally reviewed following labs and imaging studies  CBC:  Recent Labs Lab 10/05/15 0431 10/06/15 1032 10/08/15 0622 10/10/15 0703 10/11/15 0724  WBC 17.1* 16.1* 11.8* 11.3* 10.1  HGB 8.9* 9.5* 9.4* 9.5* 9.0*  HCT 27.5* 29.1* 29.3* 29.9* 28.7*  MCV 77.7* 77.6* 79.0 80.6 81.1  PLT 142* 161 176 210 217   Basic Metabolic Panel:  Recent Labs Lab  10/07/15 0543 10/08/15 0623 10/09/15 0800 10/10/15 0703 10/11/15 0723  NA 136 137 135 140  140 139  K 4.0 3.9 3.7 4.4  4.4 4.2  CL 96* 96* 98* 100*  100* 100*  CO2 23 26 26 29  29 29   GLUCOSE 129* 142* 150* 132*  131* 144*  BUN 46* 53* 56* 20  20 28*  CREATININE 3.80* 4.24* 4.33* 3.44*  3.42* 4.68*  CALCIUM 8.7* 8.8* 8.6* 9.0  9.0 8.8*  PHOS 4.4 4.9* 4.3 4.1  4.1 4.4   GFR: Estimated Creatinine Clearance: 11.9 mL/min (by C-G formula based on Cr of 4.68). Liver Function Tests:  Recent Labs Lab 10/07/15 0543 10/08/15 0623 10/09/15 0800 10/10/15 0703 10/11/15 0723  ALBUMIN 1.9* 1.9* 2.0* 2.1*  2.2* 2.1*   No results for input(s): LIPASE, AMYLASE in the last 168 hours. No results for input(s): AMMONIA in the last 168 hours. Coagulation Profile: No results for input(s): INR, PROTIME in the last 168 hours. Cardiac Enzymes: No results for input(s): CKTOTAL, CKMB, CKMBINDEX, TROPONINI in the last 168 hours. BNP (last 3 results) No results for input(s): PROBNP in the last 8760 hours. HbA1C: No results for input(s): HGBA1C in the last 72  hours. CBG:  Recent Labs Lab 10/10/15 0809 10/10/15 1201 10/10/15 1629 10/10/15 2151 10/11/15 1131  GLUCAP 159* 139* 217* 255* 135*   Lipid Profile: No results for input(s): CHOL, HDL, LDLCALC, TRIG, CHOLHDL, LDLDIRECT in the last 72 hours. Thyroid Function Tests: No results for input(s): TSH, T4TOTAL, FREET4, T3FREE, THYROIDAB in the last 72 hours. Anemia Panel: No results for input(s): VITAMINB12, FOLATE, FERRITIN, TIBC, IRON, RETICCTPCT in the last 72 hours. Urine analysis:    Component Value Date/Time   COLORURINE AMBER* 10/05/2015 1229   APPEARANCEUR TURBID* 10/05/2015 1229   LABSPEC 1.020 10/05/2015 1229   PHURINE 5.0 10/05/2015 1229   GLUCOSEU NEGATIVE 10/05/2015 1229   HGBUR LARGE* 10/05/2015 1229   BILIRUBINUR LARGE* 10/05/2015 1229   KETONESUR 15* 10/05/2015 1229   PROTEINUR >300* 10/05/2015 1229   UROBILINOGEN 1.0 02/11/2014 0913   NITRITE NEGATIVE 10/05/2015 1229   LEUKOCYTESUR LARGE* 10/05/2015 1229   Sepsis Labs:  Invalid input(s): PROCALCITONIN, LACTICIDVEN  No results found for this or any previous visit (from the past 240 hour(s)).   Radiology Studies:  No results found. Scheduled Meds: . aspirin EC  81 mg Oral Daily  . [START ON 10/12/2015]  ceFAZolin (ANCEF) IV  1 g Intravenous To SS-Surg  . darbepoetin (ARANESP) injection - DIALYSIS  100 mcg Intravenous Q Fri-HD  . docusate sodium  200 mg Oral QHS  . escitalopram  5 mg Oral Daily  . guaiFENesin  600 mg Oral BID  . heparin  5,000 Units Subcutaneous Q8H  . insulin aspart  0-9 Units Subcutaneous TID WC  . insulin aspart  3 Units Subcutaneous TID WC  . insulin glargine  8 Units Subcutaneous Daily  . metoCLOPramide (REGLAN) injection  10 mg Intravenous TID PC  . pantoprazole  40 mg Oral BID  . pneumococcal 23 valent vaccine  0.5 mL Intramuscular Tomorrow-1000  . rosuvastatin  10 mg Oral q1800    Leida Lauth, Josehua Hammar M.D on 10/11/2015 at 2:17 PM  Between 7am to 7pm - Pager -  [phone], After 7pm go to [email or web address] - password [facility]  [facility]  - Office  [phone]

## 2015-10-11 NOTE — Progress Notes (Signed)
Physical Therapy Treatment Patient Details Name: Katherine [name] MRN: [id number] DOB: 1933/09/22 Today's Date: 10/11/2015    History of Present Illness 82 y.o. female with h/o B TKA, HTN, DM, CKD, CHF admitted with fall, increased BLE edema, weakness, SOB. Dx of pulmonary edema.     PT Comments    Activity tolerance limited today by postural hypotension post Hemodialysis;  Serial BPs as follows:    10/11/15 1443 10/11/15 1449 10/11/15 1502  Vital Signs  Pulse Rate 95 (!) 105 (!) 110  BP (!) 110/49 mmHg 107/70 mmHg (!) 79/37 mmHg  Patient Position (if appropriate) Lying Sitting Standing (could not stand long enough for BP, so sat)     10/11/15 1504 10/11/15 1515  Vital Signs  Pulse Rate (!) 103 100  BP (!) 85/34 mmHg (!) 91/45 mmHg  Patient Position (if appropriate) Sitting (reclined in recliner) Sitting (Reclined)   RN aware;  TED hose ordered; will continue to monitor for activity tolerance and orthostatic hypotension in further sessions.   Follow Up Recommendations  SNF;Supervision/Assistance - 24 hour     Equipment Recommendations  Rolling walker with 5" wheels;3in1 (PT);Wheelchair (measurements PT)    Recommendations for Other Services       Precautions / Restrictions Precautions Precautions: Fall Precaution Comments: pt fell just PTA, she denies other falls in the past year    Mobility  Bed Mobility Overal bed mobility: Needs Assistance Bed Mobility: Supine to Sit     Supine to sit: Mod assist;HOB elevated     General bed mobility comments: Verbal cues for technique.  Assist to move trunk to upright sitting position.  Once upright, patient able to maintain balance.  Transfers Overall transfer level: Needs assistance Equipment used: Rolling walker (2 wheeled) Transfers: Sit to/from Stand Sit to Stand: Mod assist;+2 physical assistance         General transfer comment: Cues for safety and hand placement; dependent on momentum for successful stand;  tending to rest forearms on RW  Ambulation/Gait Ambulation/Gait assistance: +2 physical assistance;Mod assist Ambulation Distance (Feet):  (pivot steps bed t recliner) Assistive device: Rolling walker (2 wheeled) Gait Pattern/deviations: Shuffle     General Gait Details: a few steps to chair, very tired post HD, tending to rest forearms on RW; moved chair to Ms. Gherardi to allow her to sit sooner   Information systems manager Rankin (Stroke Patients Only)       Balance             Standing balance-Leahy Scale: Poor                      Cognition Arousal/Alertness: Awake/alert Behavior During Therapy: WFL for tasks assessed/performed Overall Cognitive Status: Within Functional Limits for tasks assessed                      Exercises      General Comments        Pertinent Vitals/Pain Pain Assessment: No/denies pain    Home Living                      Prior Function            PT Goals (current goals can now be found in the care plan section) Acute Rehab PT Goals Patient Stated Goal: to be strong enough to go home PT Goal Formulation: With patient  Time For Goal Achievement: 10/25/15 Potential to Achieve Goals: Good Progress towards PT goals: Progressing toward goals    Frequency  Min 3X/week    PT Plan Current plan remains appropriate    Co-evaluation             End of Session Equipment Utilized During Treatment: Gait belt;Oxygen Activity Tolerance: Patient limited by fatigue;Other (comment) (and limited by postural hypotension) Patient left: in chair;with call bell/phone within reach;with family/visitor present;Other (comment) (quite reclined in recliner)     Time: [id number] PT Time Calculation (min) (ACUTE ONLY): 42 min  Charges:  $Therapeutic Activity: 38-52 mins                    G Codes:      Van ClinesGarrigan, Breiona Couvillon Hamff 10/11/2015, 4:10 PM  Van ClinesHolly Asal Teas, [place]PT  Acute  Rehabilitation Services Pager [phone] Office [phone]

## 2015-10-11 NOTE — Progress Notes (Signed)
PT Cancellation Note  Patient Details Name: Katherine [name] MRN: [id number] DOB: 08-May-1934   Cancelled Treatment:    Reason Eval/Treat Not Completed: Patient at procedure or test/unavailable   Currently in HD;  Will follow up later today as time allows;  Otherwise, will follow up for PT tomorrow;   Thank you,  Van ClinesHolly Symphony Demuro, PT  Acute Rehabilitation Services Pager [phone] Office [phone]     Van ClinesGarrigan, Kerrington Greenhalgh Hamff 10/11/2015, 11:02 AM

## 2015-10-11 NOTE — Procedures (Signed)
I have personally attended this patient's dialysis session. BP 90's limiting UF so may be close to EDW Temp cath 400 2K bath K 4.2 Hb 9 Will use some alb for BP support and just UF what we can  Camille Balynthia Media Pizzini, MD [facility] [phone] Pager 10/11/2015, 8:58 AM

## 2015-10-12 ENCOUNTER — Other Ambulatory Visit: Payer: Self-pay

## 2015-10-12 ENCOUNTER — Inpatient Hospital Stay (HOSPITAL_COMMUNITY): Payer: Medicare Other

## 2015-10-12 ENCOUNTER — Inpatient Hospital Stay (HOSPITAL_COMMUNITY): Payer: Medicare Other | Admitting: Anesthesiology

## 2015-10-12 ENCOUNTER — Encounter (HOSPITAL_COMMUNITY)
Admission: EM | Disposition: A | Discharge status: Skilled Nursing Facility | Payer: Self-pay | Source: Home / Self Care | Attending: Internal Medicine

## 2015-10-12 DIAGNOSIS — N186 End stage renal disease: Secondary | ICD-10-CM

## 2015-10-12 DIAGNOSIS — Z992 Dependence on renal dialysis: Principal | ICD-10-CM

## 2015-10-12 DIAGNOSIS — Z48812 Encounter for surgical aftercare following surgery on the circulatory system: Secondary | ICD-10-CM

## 2015-10-12 HISTORY — PX: INSERTION OF DIALYSIS CATHETER: SHX1324

## 2015-10-12 HISTORY — PX: BASCILIC VEIN TRANSPOSITION: SHX5742

## 2015-10-12 LAB — PTH, INTACT AND CALCIUM
CALCIUM TOTAL (PTH): 8.4 mg/dL — AB (ref 8.7–10.3)
PTH: 392 pg/mL — AB (ref 15–65)

## 2015-10-12 LAB — GLUCOSE, CAPILLARY
GLUCOSE-CAPILLARY: 132 mg/dL — AB (ref 65–99)
GLUCOSE-CAPILLARY: 131 mg/dL — AB (ref 65–99)
GLUCOSE-CAPILLARY: 148 mg/dL — AB (ref 65–99)
Glucose-Capillary: 111 mg/dL — ABNORMAL HIGH (ref 65–99)

## 2015-10-12 LAB — POCT I-STAT 4, (NA,K, GLUC, HGB,HCT)
Glucose, Bld: 108 mg/dL — ABNORMAL HIGH (ref 65–99)
HEMATOCRIT: 31 % — AB (ref 36.0–46.0)
HEMOGLOBIN: 10.5 g/dL — AB (ref 12.0–15.0)
Potassium: 4.3 mmol/L (ref 3.5–5.1)
Sodium: 135 mmol/L (ref 135–145)

## 2015-10-12 LAB — MRSA PCR SCREENING: MRSA by PCR: NEGATIVE

## 2015-10-12 SURGERY — INSERTION OF DIALYSIS CATHETER
Anesthesia: General | Site: Chest | Laterality: Right

## 2015-10-12 MED ORDER — ROCURONIUM BROMIDE 100 MG/10ML IV SOLN
INTRAVENOUS | Status: DC | PRN
  Administered 2015-10-12: 40 mg via INTRAVENOUS
  Administered 2015-10-12: 10 mg via INTRAVENOUS

## 2015-10-12 MED ORDER — HEPARIN SODIUM (PORCINE) 5000 UNIT/ML IJ SOLN
INTRAMUSCULAR | Status: DC | PRN
  Administered 2015-10-12: 11:00:00

## 2015-10-12 MED ORDER — PROTAMINE SULFATE 10 MG/ML IV SOLN
INTRAVENOUS | Status: DC | PRN
  Administered 2015-10-12: 40 mg via INTRAVENOUS

## 2015-10-12 MED ORDER — HEPARIN SODIUM (PORCINE) 1000 UNIT/ML IJ SOLN
INTRAMUSCULAR | Status: AC
  Filled 2015-10-12: qty 1

## 2015-10-12 MED ORDER — MIDODRINE HCL 5 MG PO TABS: 10.0000 mg | ORAL_TABLET | ORAL | Status: DC

## 2015-10-12 MED ORDER — SUCCINYLCHOLINE CHLORIDE 20 MG/ML IJ SOLN
INTRAMUSCULAR | Status: DC | PRN
  Administered 2015-10-12: 140 mg via INTRAVENOUS

## 2015-10-12 MED ORDER — PROPOFOL 10 MG/ML IV BOLUS
INTRAVENOUS | Status: DC | PRN
  Administered 2015-10-12: 140 mg via INTRAVENOUS

## 2015-10-12 MED ORDER — 0.9 % SODIUM CHLORIDE (POUR BTL) OPTIME
TOPICAL | Status: DC | PRN
Start: 2015-10-12 — End: 2015-10-12
  Administered 2015-10-12: 1000 mL

## 2015-10-12 MED ORDER — SODIUM CHLORIDE 0.9 % IV SOLN
INTRAVENOUS | Status: DC
  Administered 2015-10-12 (×3): via INTRAVENOUS

## 2015-10-12 MED ORDER — PHENYLEPHRINE HCL 10 MG/ML IJ SOLN
INTRAMUSCULAR | Status: DC | PRN
Start: 2015-10-12 — End: 2015-10-12
  Administered 2015-10-12 (×3): 80 ug via INTRAVENOUS

## 2015-10-12 MED ORDER — HEPARIN SODIUM (PORCINE) 1000 UNIT/ML IJ SOLN
INTRAMUSCULAR | Status: DC | PRN
  Administered 2015-10-12: 1000 [IU]

## 2015-10-12 MED ORDER — NEOSTIGMINE METHYLSULFATE 10 MG/10ML IV SOLN
INTRAVENOUS | Status: DC | PRN
  Administered 2015-10-12: 3 mg via INTRAVENOUS

## 2015-10-12 MED ORDER — HEPARIN SODIUM (PORCINE) 1000 UNIT/ML IJ SOLN
INTRAMUSCULAR | Status: DC | PRN
Start: 2015-10-12 — End: 2015-10-12
  Administered 2015-10-12: 8000 [IU] via INTRAVENOUS

## 2015-10-12 MED ORDER — DEXTROSE 5 % IV SOLN
10.0000 mg | INTRAVENOUS | Status: DC | PRN
  Administered 2015-10-12: 15 ug/min via INTRAVENOUS

## 2015-10-12 MED ORDER — LIDOCAINE HCL (CARDIAC) 20 MG/ML IV SOLN
INTRAVENOUS | Status: DC | PRN
Start: 2015-10-12 — End: 2015-10-12
  Administered 2015-10-12 (×2): 50 mg via INTRAVENOUS

## 2015-10-12 MED ORDER — PAPAVERINE HCL 30 MG/ML IJ SOLN
INTRAMUSCULAR | Status: DC | PRN
  Administered 2015-10-12: 2 mL via INTRAVENOUS

## 2015-10-12 MED ORDER — LIDOCAINE-EPINEPHRINE (PF) 1 %-1:200000 IJ SOLN
INTRAMUSCULAR | Status: AC
  Filled 2015-10-12: qty 30

## 2015-10-12 MED ORDER — GLYCOPYRROLATE 0.2 MG/ML IJ SOLN
INTRAMUSCULAR | Status: DC | PRN
  Administered 2015-10-12: 0.2 mg via INTRAVENOUS
  Administered 2015-10-12: 0.4 mg via INTRAVENOUS

## 2015-10-12 MED ORDER — PAPAVERINE HCL 30 MG/ML IJ SOLN
INTRAMUSCULAR | Status: AC
  Filled 2015-10-12: qty 2

## 2015-10-12 MED ORDER — ARTIFICIAL TEARS OP OINT
TOPICAL_OINTMENT | OPHTHALMIC | Status: DC | PRN
  Administered 2015-10-12: 1 via OPHTHALMIC

## 2015-10-12 MED ORDER — EPHEDRINE SULFATE 50 MG/ML IJ SOLN
INTRAMUSCULAR | Status: DC | PRN
  Administered 2015-10-12 (×2): 10 mg via INTRAVENOUS

## 2015-10-12 MED ORDER — RENA-VITE PO TABS
1.0000 | ORAL_TABLET | Freq: Every day | ORAL | Status: DC
  Administered 2015-10-12 – 2015-10-13 (×2): 1 via ORAL
  Filled 2015-10-12 (×2): qty 1

## 2015-10-12 MED ORDER — FENTANYL CITRATE (PF) 100 MCG/2ML IJ SOLN: 25.0000 ug | INTRAMUSCULAR | Status: DC | PRN

## 2015-10-12 MED ORDER — LIDOCAINE HCL (PF) 1 % IJ SOLN
INTRAMUSCULAR | Status: AC
  Filled 2015-10-12: qty 30

## 2015-10-12 MED ORDER — ONDANSETRON HCL 4 MG/2ML IJ SOLN
INTRAMUSCULAR | Status: DC | PRN
Start: 2015-10-12 — End: 2015-10-12
  Administered 2015-10-12: 4 mg via INTRAVENOUS

## 2015-10-12 MED ORDER — IOPAMIDOL (ISOVUE-300) INJECTION 61%
INTRAVENOUS | Status: AC
  Filled 2015-10-12: qty 50

## 2015-10-12 MED ORDER — FENTANYL CITRATE (PF) 100 MCG/2ML IJ SOLN
INTRAMUSCULAR | Status: DC | PRN
  Administered 2015-10-12 (×4): 50 ug via INTRAVENOUS

## 2015-10-12 SURGICAL SUPPLY — 68 items
BAG DECANTER FOR FLEXI CONT (MISCELLANEOUS) ×4 IMPLANT
BIOPATCH RED 1 DISK 7.0 (GAUZE/BANDAGES/DRESSINGS) ×3 IMPLANT
BIOPATCH RED 1IN DISK 7.0MM (GAUZE/BANDAGES/DRESSINGS) ×1
CANISTER SUCTION 2500CC (MISCELLANEOUS) ×4 IMPLANT
CANNULA VESSEL 3MM 2 BLNT TIP (CANNULA) ×4 IMPLANT
CATH PALINDROME RT-P 15FX19CM (CATHETERS) IMPLANT
CATH PALINDROME RT-P 15FX23CM (CATHETERS) ×2 IMPLANT
CATH PALINDROME RT-P 15FX28CM (CATHETERS) IMPLANT
CATH PALINDROME RT-P 15FX55CM (CATHETERS) IMPLANT
CHLORAPREP W/TINT 26ML (MISCELLANEOUS) ×4 IMPLANT
CLIP TI MEDIUM 24 (CLIP) ×4 IMPLANT
CLIP TI WIDE RED SMALL 24 (CLIP) ×4 IMPLANT
CLIP TI WIDE RED SMALL 6 (CLIP) ×2 IMPLANT
COVER PROBE W GEL 5X96 (DRAPES) ×4 IMPLANT
COVER SURGICAL LIGHT HANDLE (MISCELLANEOUS) ×2 IMPLANT
DECANTER SPIKE VIAL GLASS SM (MISCELLANEOUS) ×4 IMPLANT
DRAIN PENROSE 1/2X12 LTX STRL (WOUND CARE) IMPLANT
DRAPE C-ARM 42X72 X-RAY (DRAPES) ×4 IMPLANT
DRAPE CHEST BREAST 15X10 FENES (DRAPES) ×4 IMPLANT
DRSG COVADERM 4X6 (GAUZE/BANDAGES/DRESSINGS) ×2 IMPLANT
ELECT REM PT RETURN 9FT ADLT (ELECTROSURGICAL) ×4
ELECTRODE REM PT RTRN 9FT ADLT (ELECTROSURGICAL) ×2 IMPLANT
GAUZE SPONGE 2X2 8PLY STRL LF (GAUZE/BANDAGES/DRESSINGS) ×2 IMPLANT
GAUZE SPONGE 4X4 16PLY XRAY LF (GAUZE/BANDAGES/DRESSINGS) ×6 IMPLANT
GLOVE BIO SURGEON STRL SZ 6.5 (GLOVE) ×2 IMPLANT
GLOVE BIO SURGEON STRL SZ7.5 (GLOVE) ×4 IMPLANT
GLOVE BIO SURGEONS STRL SZ 6.5 (GLOVE) ×2
GLOVE BIOGEL PI IND STRL 6.5 (GLOVE) IMPLANT
GLOVE BIOGEL PI IND STRL 7.0 (GLOVE) IMPLANT
GLOVE BIOGEL PI IND STRL 7.5 (GLOVE) IMPLANT
GLOVE BIOGEL PI IND STRL 8 (GLOVE) ×2 IMPLANT
GLOVE BIOGEL PI INDICATOR 6.5 (GLOVE) ×6
GLOVE BIOGEL PI INDICATOR 7.0 (GLOVE) ×6
GLOVE BIOGEL PI INDICATOR 7.5 (GLOVE) ×2
GLOVE BIOGEL PI INDICATOR 8 (GLOVE) ×4
GLOVE SURG SS PI 6.5 STRL IVOR (GLOVE) ×4 IMPLANT
GLOVE SURG SS PI 7.0 STRL IVOR (GLOVE) ×2 IMPLANT
GOWN STRL REUS W/ TWL LRG LVL3 (GOWN DISPOSABLE) ×6 IMPLANT
GOWN STRL REUS W/TWL LRG LVL3 (GOWN DISPOSABLE) ×12
KIT BASIN OR (CUSTOM PROCEDURE TRAY) ×4 IMPLANT
KIT ROOM TURNOVER OR (KITS) ×4 IMPLANT
LIQUID BAND (GAUZE/BANDAGES/DRESSINGS) ×8 IMPLANT
NDL 18GX1X1/2 (RX/OR ONLY) (NEEDLE) ×2 IMPLANT
NDL HYPO 25GX1X1/2 BEV (NEEDLE) ×2 IMPLANT
NEEDLE 18GX1X1/2 (RX/OR ONLY) (NEEDLE) ×4 IMPLANT
NEEDLE 22X1 1/2 (OR ONLY) (NEEDLE) ×4 IMPLANT
NEEDLE HYPO 25GX1X1/2 BEV (NEEDLE) ×4 IMPLANT
NS IRRIG 1000ML POUR BTL (IV SOLUTION) ×4 IMPLANT
PACK CV ACCESS (CUSTOM PROCEDURE TRAY) ×4 IMPLANT
PACK SURGICAL SETUP 50X90 (CUSTOM PROCEDURE TRAY) ×4 IMPLANT
PAD ARMBOARD 7.5X6 YLW CONV (MISCELLANEOUS) ×8 IMPLANT
SPONGE GAUZE 2X2 STER 10/PKG (GAUZE/BANDAGES/DRESSINGS) ×2
SPONGE SURGIFOAM ABS GEL 100 (HEMOSTASIS) IMPLANT
SUT ETHILON 3 0 PS 1 (SUTURE) ×4 IMPLANT
SUT PROLENE 6 0 BV (SUTURE) ×4 IMPLANT
SUT SILK 2 0 FS (SUTURE) ×2 IMPLANT
SUT SILK 2 0 SH (SUTURE) ×4 IMPLANT
SUT SILK 3 0 (SUTURE) ×4
SUT SILK 3-0 18XBRD TIE 12 (SUTURE) IMPLANT
SUT VIC AB 3-0 SH 27 (SUTURE) ×4
SUT VIC AB 3-0 SH 27X BRD (SUTURE) ×2 IMPLANT
SUT VICRYL 4-0 PS2 18IN ABS (SUTURE) ×4 IMPLANT
SYR 20CC LL (SYRINGE) ×8 IMPLANT
SYR 5ML LL (SYRINGE) ×8 IMPLANT
SYR CONTROL 10ML LL (SYRINGE) ×4 IMPLANT
SYRINGE 10CC LL (SYRINGE) ×4 IMPLANT
UNDERPAD 30X30 INCONTINENT (UNDERPADS AND DIAPERS) ×4 IMPLANT
WATER STERILE IRR 1000ML POUR (IV SOLUTION) ×4 IMPLANT

## 2015-10-12 NOTE — Anesthesia Postprocedure Evaluation (Signed)
Anesthesia Post Note  Patient: Katherine [name]  Procedure(s) Performed: Procedure(s) (LRB): INSERTION OF DIALYSIS CATHETER - RIGHT INTERNAL JUGULAR PLACEMENT (Right) BASILIC VEIN TRANSPOSITION (Left)  Patient location during evaluation: PACU Anesthesia Type: General Level of consciousness: awake and alert Pain management: pain level controlled Vital Signs Assessment: post-procedure vital signs reviewed and stable Respiratory status: spontaneous breathing, nonlabored ventilation, respiratory function stable and patient connected to nasal cannula oxygen Cardiovascular status: blood pressure returned to baseline and stable Postop Assessment: no signs of nausea or vomiting Anesthetic complications: no    Last Vitals:  Filed Vitals:   10/12/15 1515 10/12/15 1549  BP:  128/46  Pulse: 98 100  Temp: 36.8 C 37.1 C  Resp: 15 16    Last Pain:  Filed Vitals:   10/12/15 1549  PainSc: 0-No pain                 Tamana Hatfield J

## 2015-10-12 NOTE — Anesthesia Preprocedure Evaluation (Addendum)
Anesthesia Evaluation  Patient identified by MRN, date of birth, ID band Patient awake    Reviewed: Allergy & Precautions, NPO status , Patient's Chart, lab work & pertinent test results  Airway Mallampati: II  TM Distance: >3 FB Neck ROM: Full    Dental no notable dental hx.    Pulmonary neg pulmonary ROS, former smoker,    Pulmonary exam normal breath sounds clear to auscultation       Cardiovascular hypertension, Pt. on medications and Pt. on home beta blockers +CHF  Normal cardiovascular exam Rhythm:Regular Rate:Normal  ECHO 09-25-15: Study Conclusions  - Left ventricle: The cavity size was normal. Wall thickness was  increased in a pattern of mild LVH. Systolic function was normal.  The estimated ejection fraction was in the range of 55% to 60%.  Wall motion was normal; there were no regional wall motion  abnormalities. Doppler parameters are consistent with abnormal  left ventricular relaxation (grade 1 diastolic dysfunction). - Mitral valve: Calcified annulus. - Left atrium: The atrium was mildly dilated.   Neuro/Psych negative neurological ROS  negative psych ROS   GI/Hepatic negative GI ROS, Neg liver ROS,   Endo/Other  diabetes, Type 2, Insulin Dependent, Oral Hypoglycemic AgentsMorbid obesityGlucose 105  Renal/GU ESRF and DialysisRenal diseaseK 4.3  negative genitourinary   Musculoskeletal  (+) Arthritis ,   Abdominal (+) + obese,   Peds negative pediatric ROS (+)  Hematology  (+) anemia , HGB 10.5   Anesthesia Other Findings   Reproductive/Obstetrics negative OB ROS                         Anesthesia Physical Anesthesia Plan  ASA: IV  Anesthesia Plan: General   Post-op Pain Management:    Induction: Intravenous  Airway Management Planned: Oral ETT  Additional Equipment:   Intra-op Plan:   Post-operative Plan: Extubation in OR  Informed Consent: I have  reviewed the patients History and Physical, chart, labs and discussed the procedure including the risks, benefits and alternatives for the proposed anesthesia with the patient or authorized representative who has indicated his/her understanding and acceptance.   Dental advisory given  Plan Discussed with: CRNA  Anesthesia Plan Comments: (Airway OK in 2015 for TKA)       Anesthesia Quick Evaluation
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
## 2015-10-12 NOTE — Progress Notes (Signed)
Pt leaving floor via bed for graft placement transporter Johnathon

## 2015-10-12 NOTE — Progress Notes (Signed)
Patient ID: Katherine [name], female   DOB: 1933/09/17, 82 y.o.   MRN: [id number]  [facility] Progress Note    Subjective:   Eating much better with addition of IV reglan For permanent access placement today Having issues with orthostatic hypotension that are impeding work with PT (though still with a little edema on exam) TED hose ordered yesterdat   Objective:   BP 117/40 mmHg  Pulse 89  Temp(Src) 98.5 F (36.9 C) (Oral)  Resp 17  Ht 5' 4"  (1.626 m)  Wt 110.4 kg (243 lb 6.2 oz)  BMI 41.76 kg/m2  SpO2 100%  Intake/Output Summary (Last 24 hours) at 10/12/15 0850 Last data filed at 10/11/15 1700  Gross per 24 hour  Intake    240 ml  Output   1481 ml  Net  -1241 ml   Weight change: -1.899 kg (-4 lb 3 oz)  Physical Exam: Gen: alert, awake BP 117/40 mmHg  Pulse 89  Temp(Src) 98.5 F (36.9 C) (Oral)  Resp 17  Ht 5' 4"  (1.626 m)  Wt 110.4 kg (243 lb 6.2 oz)  BMI 41.76 kg/m2  SpO2 100%  CVS: Regular rhythm, normal rate, S1 and S2 normal  Resp: Clear lungs bilat, no rales/ rhonchi Abd: Soft, obese, nontender, firm nontender mass midabdomen Ext: minimal / trace lower extremity edema bilaterally Marked area RLE (from fall PTA) still with some nodular swelling Temp HD cath R chest   CXR 4/24 bilat effusions layering, vasc congestion, c/w chf CXR 5/4 resolved effusions/ CHF, some residual IS prominence   Recent Labs Lab 10/06/15 0653 10/07/15 0543 10/08/15 0623 10/09/15 0800 10/10/15 0703 10/11/15 0723  NA 136 136 137 135 140  140 139  K 4.4 4.0 3.9 3.7 4.4  4.4 4.2  CL 96* 96* 96* 98* 100*  100* 100*  CO2 22 23 26 26 29  29 29   GLUCOSE 104* 129* 142* 150* 132*  131* 144*  BUN 29* 46* 53* 56* 20  20 28*  CREATININE 2.69* 3.80* 4.24* 4.33* 3.44*  3.42* 4.68*  CALCIUM 8.9 8.7* 8.8* 8.6* 9.0  9.0 8.8*  PHOS 3.9 4.4 4.9* 4.3 4.1  4.1 4.4     Recent Labs Lab 10/08/15 0622 10/10/15 0703 10/11/15 0724 10/11/15 1615  WBC 11.8* 11.3*  10.1 12.0*  HGB 9.4* 9.5* 9.0* 9.3*  HCT 29.3* 29.9* 28.7* 30.2*  MCV 79.0 80.6 81.1 79.7  PLT 176 210 217 212   Medications:    . aspirin EC  81 mg Oral Daily  .  ceFAZolin (ANCEF) IV  1 g Intravenous To SS-Surg  . darbepoetin (ARANESP) injection - DIALYSIS  100 mcg Intravenous Q Fri-HD  . docusate sodium  200 mg Oral QHS  . escitalopram  5 mg Oral Daily  . guaiFENesin  600 mg Oral BID  . heparin  5,000 Units Subcutaneous Q8H  . insulin aspart  0-9 Units Subcutaneous TID WC  . insulin aspart  3 Units Subcutaneous TID WC  . insulin glargine  8 Units Subcutaneous Daily  . metoCLOPramide (REGLAN) injection  10 mg Intravenous TID PC  . pantoprazole  40 mg Oral BID  . pneumococcal 23 valent vaccine  0.5 mL Intramuscular Tomorrow-1000  . rosuvastatin  10 mg Oral q1800   Background 82 y.o. year-old AAF with DM, HTN, HLD, known CKD with solitary functioning kidney who has AKI on CKD in the setting of acute diastolic heart failure. She was seen by Dr. Moshe Cipro 07/2015 for an upward trending  of creatinine (see table below). Pt was on losartan, and was taking Aleve which she was asked to stop. Lasix 40 mg BID was added for edema. When labs were rechecked a month later, creatinine was up to 3.22 and she was then asked to stop her lasix, losartan. Creatinine after that change dropped to 1.95 but when she stopped the furosemide, she started swelling with development of pitting edema to the thighs, and became more SOB. She had a fall at home on 4/24 and called EMS for help getting up - noted to be SOB with low O2 sats so was brought to the hospital and admitted for treatment of CHF. She did not respond to ceiling dose diuretics so was transferred from [facility] for dialysis. 1st TMT 5/1, then had some hesitation about continuing, has met with Palliative Care and is agreeable to proceed with access placement  Assessment:   1. AKI/ CKD 4/ solitary functioning kidney->new ESRD: tipped over into ESRD by  cardiorenal syndrome. Has done well with HD. Minimal UOP since starting dialysis. Has met with palliative team is moving ahead w full medical care, including dialysis. She understands that it will be her decision down the road whether or not to continue if quality of life not what she wants.  For tunneled cath and perm access today by [facility] . CLIP pending. EDW around 113 kg. Low BP on HD yesterday. Adding midodrine with HD and will keep even with 5/12 HD 2. Vol excess/ pulm edema- resolved clinically, on room air. CXR improved. Weight down 13 kg from highest weight this admission. Still with LE edema on exam but BP limits much furrther UF.  3. Orthostatic hypotension - added TED hose yesterday. Add midodrine for BP on HD. Keep volume even with HD tomorrow. 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 Check PTH (sent from HD 5/10).  5. Anemia: sp feraheme x 1 on 4/27 (510 mg, tfs was 5%); started darbe 100/ wk. QFriday 6. Hypertension: Off all BP meds now with soft BP's and orthostasis 7. DM on insulin 8. Nausea - Marked improvement with Reglan. Still getting IV - ? Transition to po? 9. UTI/ fever/ [place] - completed a course of rocephin. WBC 12K  Jamal Maes, MD [facility] [phone] Pager 10/10/2015, 2:15 PM

## 2015-10-12 NOTE — Progress Notes (Signed)
Patient NPO for graft placement.

## 2015-10-12 NOTE — Clinical Social Work Note (Signed)
CSW continuing to monitor patient's progress. Patient will discharge to a skilled facility once medically stable and [facility] is her preference. CSW will continue to follow and facilitate discharge [facility] skilled [facility] once medically stable for [facility].  Genelle BalVanessa Alaisa Moffitt, MSW, LCSW Licensed Clinical Social Worker Clinical Social Work Department [facility] [phone]

## 2015-10-12 NOTE — Op Note (Signed)
    NAME: Katherine LauthClara P [name]   MRN: [id number] DOB: Oct 08, 1933    DATE OF OPERATION: 10/12/2015  PREOP DIAGNOSIS: Stage V chronic kidney disease  POSTOP DIAGNOSIS: Same  PROCEDURE:  1.  Placement of right IJ tunneled cuffed dialysis 23 cm catheter 2. Left basilic vein transposition  SURGEON: Di Kindlehristopher S. Edilia Boickson, MD, FACS  ASSIST: Doreatha MassedSamantha Rhyne, PA, Lianne CureMaureen Collins PA.  ANESTHESIA: Gen.   EBL: minimal  INDICATIONS: Katherine [name] is a 82 y.o. female ho presents for new access.  FINDINGS: The temporary right IJ catheter was changed over a wire. The basilic vein was marginal in size about  4 mm. There was one segment which was bifid and I took the larger of the 2 segments.  TECHNIQUE: The patient was taken to the operative room and received a general anesthetic. The neck and upper chest were prepped and draped in the usual sterile fashion. The temporary catheter was clamped and cut. A wire was then advanced through the catheter into the right atrium. The catheter was removed.  The peel-away sheath and dilator were advanced over the wire and the wire and dilator were then removed. The 23 cm catheter was positioned in the right atrium. The peel-away sheath was removed. The exit site for the catheter was selected and the catheter was then brought to the tunnel cut the appropriate length and the distal ports were attached. Both ports withdrew easily and flushed with heparin saline and filled with concentrated heparin. The catheter was secured at its exit site with a 3-0 nylon suture. The IJ cannulation site was closed with a 4-0 subcuticular stitch. A sterile dressing was applied.  Attention was turned to placing a basilic vein transposition in the left arm. Using 2 incisions along the medial aspect of the left arm the basilic vein was harvested from the antecubital level towards the axilla. There was a segment of the vein which was bifid. I left both segments intact. The vein emptied into the  brachial system in the mid upper arm and therefore the brachial vein distally was ligated. Through the distal incision the brachial artery was dissected free beneath the fascia. A tunnel was graded between the 2 incisions and the vein was then gently distended and marked her prevent twisting. It was then brought to the tunnel and the patient was heparinized. The brachial artery was clamped proximally and distally and a longitudinal arteriotomy was made. The vein was spatulated and sewn end-to-side to the artery using continuous 6-0 Prolene suture. At the completion was an excellent thrill in the fistula. There was a radial signal with the Doppler. Hemostasis was obtained in the wounds and the heparin was partially reversed with protamine. Each of the wounds was closed with deep layer of 3-0 Vicryl and the skin closed with 4-0 Vicryl. Sterile dressing was applied. The patient tolerated the procedure well and was transferred to the recovery room in stable condition. All needle and sponge counts were correct.  Katherine Ferrarihristopher Blayze Haen, MD, FACS [facility]  DATE OF DICTATION:   10/12/2015

## 2015-10-12 NOTE — Progress Notes (Signed)
PROGRESS NOTE  Katherine LauthClara P Casteneda ZOX:[id number] DOB: December 31, 1933 DOA: 09/25/2015 PCP: Enrique SackGREEN, EDWIN JAY, MD Outpatient Specialists:    LOS: 17 days    Brief Narrative:  Katherine [name] is an 82 y.o. 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 Renal was consulted, patient did not respond to conservative management with high-dose Lasix, and eventually required to be started on dialysis on 5/1 .  She subsequently refused. Hemodialysis runs and wanted to become comfort care, she has now changed her mind and as been placed back on hemodialysis. She has had her cyst and nausea and vomiting however after instillation of IV PPI and Reglan pre-meal on 10/08/2015 she is feeling much better on 10/10/2015.   Assessment & Plan:  Acute respiratory resp. failure with hypoxia in the setting of acute on chronic diastolic heart failure EF 55% - due to volume overload from acute diastolic CHF (congestive heart failure) (HCC)/chronic kidney disease stage 3-4/Cardiorenal syndrome, she had dialysis catheter placement by IR and dialysis was started on 10/02/2015. Respiratory status much improved, continue supportive care. Echo reviewed. - Resolved, currently on room air  AKI on chronic kidney disease stage IV with a solitary functioning renal kidney - IR Placed temporary right IJ hemodialysis catheter placement 5/1 with initiation of dialysis on 10/02/2015, - Management per nephrology, patient refused dialysis for 2 days but on 10/08/2015 she now says she has changed her mind as she wants to live, have informed renal, will also involve palliative care to evaluate the patient for goals of  care and CODE STATUS. She currently wants to be full code and now wants to pursue aggressive treatment measures. Dialysis was resumed on 10/09/2015. - Status post right IJ tunneled cuffed dialysis 23 cm catheter and left basilic vein transposition by Dr. Durwin Noraixon today  Severe nausea - Significantly improved  Leukocytosis due to UTI  - afebrile, repeat chest x-ray unremarkable, UTI treated with Rocephin.  Essential HTN (hypertension)   - off amlodipine, Coreg and BiDil  - Started on midodrine  Dyslipidemia - Continue statins  Morbid obesity (HCC)    - Advise to follow low calorie diet and to increase physical activity,  Body mass index is 48.68 kg/(m^2  Anemia  - Anemia of chronic kidney disease, status post IV iron.  Depression  - started on low-dose Lexapro  Abdominal fluid collection  - likely chronic, discussed with IR 5/4, it appears without surrounding inflammation and less likely to be infected. Agree with radiology that may be chronic and may not be fully beneficial to drainage at this point.  DM (diabetes mellitus) (HCC) uncontrolled  - currently on Lantus, female NovoLog and sliding scale. A1c was 9. Monitor CBGs.  CBG (last 3)   Recent Labs  10/11/15 2058 10/12/15 0839 10/12/15 1354  GLUCAP 180* 111* 132*      DVT prophylaxis: Heparin  Code Status: Full code  Family Communication:  None at bedside Disposition Plan: SNF, hopefully on Saturday after hemodialysis session   Consultants:   Nephrology  Palliative care Vascular surgery Procedures:    2D echo: EF 55-60%, grade 1 diastolic dysfunction  HD  Foley  Anti-infectives    Start     Dose/Rate Route  Frequency Ordered Stop   10/12/15 1000  ceFAZolin (ANCEF) IVPB 1 g/50 mL premix    Comments:  Send with pt to OR   1 g 100 mL/hr over 30 Minutes Intravenous To [facility]Stay [facility] 10/11/15 0945 10/12/15 1100   10/05/15 1800  cefTRIAXone (ROCEPHIN) 1 g in dextrose 5 % 50 mL IVPB  Status:   Discontinued     1 g 100 mL/hr over 30 Minutes Intravenous Every 24 hours 10/05/15 1443 10/09/15 0934      Subjective:  Denies any complaints, no headache, no chest or abdominal pain, no shortness of breath.  Objective: Filed Vitals:   10/12/15 1421 10/12/15 1430 10/12/15 1445 10/12/15 1451  BP: 125/45   122/41  Pulse: 94 97 99 98  Temp:      TempSrc:      Resp: 15 15 17 14   Height:      Weight:      SpO2: 97% 98% 97% 94%    Intake/Output Summary (Last 24 hours) at 10/12/15 1514 Last data filed at 10/12/15 1456  Gross per 24 hour  Intake    790 ml  Output     75 ml  Net    715 ml   Filed Weights   10/11/15 0649 10/11/15 1057 10/11/15 2101  Weight: 113.1 kg (249 lb 5.4 oz) 111.5 kg (245 lb 13 oz) 110.4 kg (243 lb 6.2 oz)    Examination:  Filed Vitals:   10/12/15 1421 10/12/15 1430 10/12/15 1445 10/12/15 1451  BP: 125/45   122/41  Pulse: 94 97 99 98  Temp:      TempSrc:      Resp: 15 15 17 14   Height:      Weight:      SpO2: 97% 98% 97% 94%   Constitutional: NAD Eyes: PERRL, lids and conjunctivae normal ENMT: Mucous membranes are moist. No oropharyngeal exudates Neck: normal, supple, no masses Respiratory: clear to auscultation bilaterally, no wheezing, no crackles. Normal respiratory effort. No accessory muscle use.  Cardiovascular: Regular rate and rhythm, no murmurs / rubs / gallops. No extremity edema. 2+ pedal pulses.  Abdomen: no tenderness. Bowel sounds positive. 10 cm mass anterior aspect  Musculoskeletal: no clubbing / cyanosis.   Neurologic: non focal    Data Reviewed: I have personally reviewed following labs and imaging studies  CBC:  Recent Labs Lab 10/06/15 1032 10/08/15 0622 10/10/15 0703 10/11/15 0724 10/11/15 1615 10/12/15 0934  WBC 16.1* 11.8* 11.3* 10.1 12.0*  --   HGB 9.5* 9.4* 9.5* 9.0* 9.3* 10.5*  HCT 29.1* 29.3* 29.9* 28.7* 30.2* 31.0*  MCV 77.6* 79.0 80.6 81.1 79.7  --   PLT 161 176 210 217 212  --    Basic Metabolic  Panel:  Recent Labs Lab 10/07/15 0543 10/08/15 0623 10/09/15 0800 10/10/15 0703 10/11/15 0723 10/11/15 0729 10/12/15 0934  NA 136 137 135 140  140 139  --  135  K 4.0 3.9 3.7 4.4  4.4 4.2  --  4.3  CL 96* 96* 98* 100*  100* 100*  --   --   CO2 23 26 26 29  29 29   --   --   GLUCOSE 129* 142* 150* 132*  131* 144*  --  108*  BUN 46* 53* 56* 20  20 28*  --   --   CREATININE 3.80* 4.24* 4.33* 3.44*  3.42* 4.68*  --   --   CALCIUM 8.7* 8.8* 8.6* 9.0  9.0 8.8* 8.4*  --  PHOS 4.4 4.9* 4.3 4.1  4.1 4.4  --   --    GFR: Estimated Creatinine Clearance: 11.8 mL/min (by C-G formula based on Cr of 4.68). Liver Function Tests:  Recent Labs Lab 10/07/15 0543 10/08/15 0623 10/09/15 0800 10/10/15 0703 10/11/15 0723  ALBUMIN 1.9* 1.9* 2.0* 2.1*  2.2* 2.1*   No results for input(s): LIPASE, AMYLASE in the last 168 hours. No results for input(s): AMMONIA in the last 168 hours. Coagulation Profile: No results for input(s): INR, PROTIME in the last 168 hours. Cardiac Enzymes: No results for input(s): CKTOTAL, CKMB, CKMBINDEX, TROPONINI in the last 168 hours. BNP (last 3 results) No results for input(s): PROBNP in the last 8760 hours. HbA1C: No results for input(s): HGBA1C in the last 72 hours. CBG:  Recent Labs Lab 10/11/15 1131 10/11/15 1625 10/11/15 2058 10/12/15 0839 10/12/15 1354  GLUCAP 135* 204* 180* 111* 132*   Lipid Profile: No results for input(s): CHOL, HDL, LDLCALC, TRIG, CHOLHDL, LDLDIRECT in the last 72 hours. Thyroid Function Tests: No results for input(s): TSH, T4TOTAL, FREET4, T3FREE, THYROIDAB in the last 72 hours. Anemia Panel: No results for input(s): VITAMINB12, FOLATE, FERRITIN, TIBC, IRON, RETICCTPCT in the last 72 hours. Urine analysis:    Component Value Date/Time   COLORURINE AMBER* 10/05/2015 1229   APPEARANCEUR TURBID* 10/05/2015 1229   LABSPEC 1.020 10/05/2015 1229   PHURINE 5.0 10/05/2015 1229   GLUCOSEU NEGATIVE 10/05/2015 1229    HGBUR LARGE* 10/05/2015 1229   BILIRUBINUR LARGE* 10/05/2015 1229   KETONESUR 15* 10/05/2015 1229   PROTEINUR >300* 10/05/2015 1229   UROBILINOGEN 1.0 02/11/2014 0913   NITRITE NEGATIVE 10/05/2015 1229   LEUKOCYTESUR LARGE* 10/05/2015 1229   Sepsis Labs:  Invalid input(s): PROCALCITONIN, LACTICIDVEN  Recent Results (from the past 240 hour(s))  MRSA PCR Screening     Status: None   Collection Time: 10/12/15  4:54 AM  Result Value Ref Range Status   MRSA by PCR NEGATIVE NEGATIVE Final    Comment:        The GeneXpert MRSA Assay (FDA approved for NASAL specimens only), is one component of a comprehensive MRSA colonization surveillance program. It is not intended to diagnose MRSA infection nor to guide or monitor treatment for MRSA infections.      Radiology Studies:  Dg Chest Port 1 View  10/12/2015  CLINICAL DATA:  Dialysis catheter placement EXAM: PORTABLE CHEST 1 VIEW COMPARISON:  Oct 07, 2015 FINDINGS: Dual-lumen catheter tip is in the superior vena cava just proximal to the cavoatrial junction. No pneumothorax. There is slight atelectasis in the left base. Lungs elsewhere clear. Heart is mildly enlarged with pulmonary vascularity within normal limits. No adenopathy. There is atherosclerotic calcification in the aorta. There is degenerative change in each shoulder. IMPRESSION: Central catheter as described without pneumothorax. Stable cardiac prominence. Left base atelectasis. Electronically Signed   By: Bretta Bang III M.D.   On: 10/12/2015 14:20   Dg Fluoro Guide Cv Line-no Report  10/12/2015  CLINICAL DATA:  FLOURO GUIDE CV LINE Fluoroscopy was utilized by the requesting physician.  No radiographic interpretation.   Scheduled Meds: . [MAR Hold] aspirin EC  81 mg Oral Daily  . [MAR Hold] darbepoetin (ARANESP) injection - DIALYSIS  100 mcg Intravenous Q Fri-HD  . [MAR Hold] docusate sodium  200 mg Oral QHS  . [MAR Hold] escitalopram  5 mg Oral Daily  . [MAR Hold]  guaiFENesin  600 mg Oral BID  . [MAR Hold] heparin  5,000 Units Subcutaneous Q8H  . [  MAR Hold] insulin aspart  0-9 Units Subcutaneous TID WC  . [MAR Hold] insulin aspart  3 Units Subcutaneous TID WC  . [MAR Hold] insulin glargine  8 Units Subcutaneous Daily  . [MAR Hold] metoCLOPramide (REGLAN) injection  10 mg Intravenous TID PC  . [MAR Hold] midodrine  10 mg Oral Q M,W,F-HD  . [MAR Hold] multivitamin  1 tablet Oral QHS  . [MAR Hold] pantoprazole  40 mg Oral BID  . [MAR Hold] pneumococcal 23 valent vaccine  0.5 mL Intramuscular Tomorrow-1000  . [MAR Hold] rosuvastatin  10 mg Oral q1800    Leida Lauth, Kalynn Declercq M.D on 10/12/2015 at 3:14 PM  Between 7am to 7pm - Pager - [phone], After 7pm go to [email or web address] - password [facility]  [facility]  - Office  [phone]

## 2015-10-12 NOTE — Transfer of Care (Signed)
Immediate Anesthesia Transfer of Care Note  Patient: Katherine [name]  Procedure(s) Performed: Procedure(s): INSERTION OF DIALYSIS CATHETER - RIGHT INTERNAL JUGULAR PLACEMENT (Right) BASILIC VEIN TRANSPOSITION (Left)  Patient Location: PACU  Anesthesia Type:General  Level of Consciousness: awake, alert , oriented and sedated  Airway & Oxygen Therapy: Patient Spontanous Breathing and Patient connected to face mask oxygen  Post-op Assessment: Report given to RN, Post -op Vital signs reviewed and stable and Patient moving all extremities  Post vital signs: Reviewed and stable  Last Vitals:  Filed Vitals:   10/12/15 0840 10/12/15 1351  BP: 117/40   Pulse: 89   Temp: 36.9 C 36.9 C  Resp: 17     Last Pain:  Filed Vitals:   10/12/15 1356  PainSc: Asleep      Patients Stated Pain Goal: 3 (10/07/15 2115)  Complications: No apparent anesthesia complications

## 2015-10-12 NOTE — Anesthesia Procedure Notes (Addendum)
Procedure Name: Intubation Date/Time: 10/12/2015 10:58 AM Performed by: Fransisca KaufmannMEYER, Lion Fernandez E Pre-anesthesia Checklist: Patient identified, Emergency Drugs available, Suction available and Patient being monitored Patient Re-evaluated:Patient Re-evaluated prior to inductionOxygen Delivery Method: Circle System Utilized Preoxygenation: Pre-oxygenation with 100% oxygen Intubation Type: IV induction Ventilation: Mask ventilation without difficulty Laryngoscope Size: Mac and 3 Grade View: Grade I Tube type: Oral Tube size: 7.5 mm Number of attempts: 1 Airway Equipment and Method: Stylet and Oral airway Placement Confirmation: ETT inserted through vocal cords under direct vision,  positive ETCO2 and breath sounds checked- equal and bilateral Secured at: 22 cm Tube secured with: Tape Dental Injury: Teeth and Oropharynx as per pre-operative assessment

## 2015-10-12 NOTE — Progress Notes (Signed)
Pt back from surgery.  Graft placed left upper forearm. Positive bruitt and thrill.  Pt alert and oriented, denies pain.  Pt co of sore throat from surgery.  VSS. CBG 131.  Family at bedside.

## 2015-10-12 NOTE — Interval H&P Note (Signed)
History and Physical Interval Note:  10/12/2015 10:17 AM  Katherine [name]  has presented today for surgery, with the diagnosis of Stage IV Chronic Kidney Disease N18.4  The various methods of treatment have been discussed with the patient and family. After consideration of risks, benefits and other options for treatment, the patient has consented to  Procedure(s): INSERTION OF DIALYSIS CATHETER (N/A) BASILIC VEIN TRANSPOSITION (Left) as a surgical intervention .  The patient's history has been reviewed, patient examined, no change in status, stable for surgery.  I have reviewed the patient's chart and labs.  Questions were answered to the patient's satisfaction.     Waverly Ferrariickson, Christopher

## 2015-10-13 ENCOUNTER — Inpatient Hospital Stay (HOSPITAL_COMMUNITY): Payer: Medicare Other

## 2015-10-13 ENCOUNTER — Encounter (HOSPITAL_COMMUNITY): Payer: Self-pay | Admitting: Vascular Surgery

## 2015-10-13 LAB — GLUCOSE, CAPILLARY
GLUCOSE-CAPILLARY: 127 mg/dL — AB (ref 65–99)
GLUCOSE-CAPILLARY: 119 mg/dL — AB (ref 65–99)
Glucose-Capillary: 184 mg/dL — ABNORMAL HIGH (ref 65–99)
Glucose-Capillary: 167 mg/dL — ABNORMAL HIGH (ref 65–99)

## 2015-10-13 MED ORDER — POLYETHYLENE GLYCOL 3350 17 G PO PACK
34.0000 g | PACK | Freq: Once | ORAL | Status: AC
  Administered 2015-10-13: 34 g via ORAL
  Filled 2015-10-13: qty 2

## 2015-10-13 MED ORDER — BISACODYL 5 MG PO TBEC
10.0000 mg | DELAYED_RELEASE_TABLET | Freq: Once | ORAL | Status: AC
  Administered 2015-10-13: 10 mg via ORAL
  Filled 2015-10-13: qty 2

## 2015-10-13 MED ORDER — MIDODRINE HCL 5 MG PO TABS
10.0000 mg | ORAL_TABLET | ORAL | Status: DC
  Administered 2015-10-14: 10 mg via ORAL

## 2015-10-13 MED ORDER — DARBEPOETIN ALFA 100 MCG/0.5ML IJ SOSY
100.0000 ug | PREFILLED_SYRINGE | INTRAMUSCULAR | Status: DC
  Administered 2015-10-14: 100 ug via INTRAVENOUS

## 2015-10-13 MED ORDER — DOXERCALCIFEROL 4 MCG/2ML IV SOLN
1.0000 ug | INTRAVENOUS | Status: DC
  Administered 2015-10-14: 1 ug via INTRAVENOUS

## 2015-10-13 MED ORDER — SENNOSIDES-DOCUSATE SODIUM 8.6-50 MG PO TABS
1.0000 | ORAL_TABLET | Freq: Two times a day (BID) | ORAL | Status: DC
  Administered 2015-10-13 – 2015-10-14 (×2): 1 via ORAL
  Filled 2015-10-13 (×2): qty 1

## 2015-10-13 NOTE — Progress Notes (Signed)
Patient ID: ASUCENA GALER, female   DOB: May 07, 1934, 82 y.o.   MRN: [id number]  [facility] Progress Note    Subjective:   Had left BVT AVF and TDC done yesterday 5/11/ by Dr. Scot Dock Has new swelling over left ulnar styloid area - almost looks like hematoma - not there pre-op No SOB   Objective:   BP 126/45 mmHg  Pulse 87  Temp(Src) 99.1 F (37.3 C) (Oral)  Resp 16  Ht 5' 4"  (1.626 m)  Wt 113 kg (249 lb 1.9 oz)  BMI 42.74 kg/m2  SpO2 98%  Intake/Output Summary (Last 24 hours) at 10/13/15 0742 Last data filed at 10/13/15 0645  Gross per 24 hour  Intake    560 ml  Output     75 ml  Net    485 ml   Weight change: 1.5 kg (3 lb 4.9 oz)  Physical Exam: Gen: alert, awake BP 126/45 mmHg  Pulse 87  Temp(Src) 99.1 F (37.3 C) (Oral)  Resp 16  Ht 5' 4"  (1.626 m)  Wt 113 kg (249 lb 1.9 oz)  BMI 42.74 kg/m2  SpO2 98%  CVS: Regular rhythm, normal rate, S1 and S2 normal  Resp: Clear lungs bilat, no rales/ rhonchi Abd: Soft, obese, nontender, firm nontender mass midabdomen Ext: minimal / trace lower extremity edema bilaterally Marked area RLE (from fall PTA) still with some nodular swelling New R TDC R IJ (5/11) L BVT AVF + bruit. Some bruising, hand warm Hematoma/swelling over left wrist - new  CXR 4/24 bilat effusions layering, vasc congestion, c/w chf CXR 5/4 resolved effusions/ CHF, some residual IS prominence   Recent Labs Lab 10/07/15 0543 10/08/15 0623 10/09/15 0800 10/10/15 0703 10/11/15 0723 10/11/15 0729 10/12/15 0934  NA 136 137 135 140  140 139  --  135  K 4.0 3.9 3.7 4.4  4.4 4.2  --  4.3  CL 96* 96* 98* 100*  100* 100*  --   --   CO2 23 26 26 29  29 29   --   --   GLUCOSE 129* 142* 150* 132*  131* 144*  --  108*  BUN 46* 53* 56* 20  20 28*  --   --   CREATININE 3.80* 4.24* 4.33* 3.44*  3.42* 4.68*  --   --   CALCIUM 8.7* 8.8* 8.6* 9.0  9.0 8.8* 8.4*  --   PHOS 4.4 4.9* 4.3 4.1  4.1 4.4  --   --    PTH  Date Value Ref  Range Status  10/11/2015 392* 15 - 65 pg/mL Final     Recent Labs Lab 10/08/15 0622 10/10/15 0703 10/11/15 0724 10/11/15 1615 10/12/15 0934  WBC 11.8* 11.3* 10.1 12.0*  --   HGB 9.4* 9.5* 9.0* 9.3* 10.5*  HCT 29.3* 29.9* 28.7* 30.2* 31.0*  MCV 79.0 80.6 81.1 79.7  --   PLT 176 210 217 212  --    Medications:    . aspirin EC  81 mg Oral Daily  . darbepoetin (ARANESP) injection - DIALYSIS  100 mcg Intravenous Q Fri-HD  . docusate sodium  200 mg Oral QHS  . escitalopram  5 mg Oral Daily  . guaiFENesin  600 mg Oral BID  . heparin  5,000 Units Subcutaneous Q8H  . insulin aspart  0-9 Units Subcutaneous TID WC  . insulin aspart  3 Units Subcutaneous TID WC  . insulin glargine  8 Units Subcutaneous Daily  . metoCLOPramide (REGLAN) injection  10 mg  Intravenous TID PC  . midodrine  10 mg Oral Q M,W,F-HD  . multivitamin  1 tablet Oral QHS  . pantoprazole  40 mg Oral BID  . pneumococcal 23 valent vaccine  0.5 mL Intramuscular Tomorrow-1000  . rosuvastatin  10 mg Oral q1800   Background 82 y.o. year-old AAF with DM, HTN, HLD, known CKD with solitary functioning kidney who has AKI on CKD in the setting of acute diastolic heart failure. She was seen by Dr. Moshe Cipro 07/2015 for an upward trending of creatinine (see table below). Pt was on losartan, and was taking Aleve which she was asked to stop. Lasix 40 mg BID was added for edema. When labs were rechecked a month later, creatinine was up to 3.22 and she was then asked to stop her lasix, losartan. Creatinine after that change dropped to 1.95 but when she stopped the furosemide, she started swelling with development of pitting edema to the thighs, and became more SOB. She had a fall at home on 4/24 and called EMS for help getting up - noted to be SOB with low O2 sats so was brought to the hospital and admitted for treatment of CHF. She did not respond to ceiling dose diuretics so was transferred from [facility]. 1st HD TMT 5/1, then  had some hesitation about continuing, has met with Palliative Care and is agreeable to proceed with long term HD  Assessment:   1. AKI/ CKD 4/ solitary functioning kidney->new ESRD: tipped over into ESRD by cardiorenal syndrome. Has done well with HD. Minimal UOP since starting dialysis. Has met with palliative team is moving ahead w full medical care, including dialysis. She understands that it will be her decision down the road whether or not to continue if quality of life not what she wants. She is now  s/p tunneled cath and perm access 5/11 by [facility] . CLIP - has TTS 2nd shift chair at [facility] and can start next Tuesday. So will hold next HD until Saturday to get on schedule.  EDW around 113 kg. Low BP on HD yesterday. Adding midodrine with HD and will keep even with next HD which will be on Saturday to keep her on schedule. 2. Vol excess/ pulm edema- resolved clinically, on room air. CXR improved. Weight down 13 kg from highest weight this admission. Still with LE edema on exam but BP limits much furrther UF.  3. Orthostatic hypotension - added TED hose. Added midodrine for BP on HD. Keep volume even with HD tomorrow. 4. CKD-MBD - no binders needed yet. PTH 392 - start hectorol with HD 1 mcg TIW 5. Anemia: sp feraheme x 1 on 4/27 (510 mg, tfs was 5%); started darbe 100/ wk.  6. Hypertension: Off all BP meds now with soft BP's and orthostasis 7. DM on insulin 8. Nausea - Marked improvement with Reglan. Still getting IV - ? Transition to po? 9. UTI/ fever/ [place] - completed a course of rocephin. WBC 12K 10. L wrist swelling - ? Hematoma ? Related to tucked positioning in OR for [facility] ? Gout? Xray wrist.  Jamal Maes, MD [place] Pager 10/10/2015, 2:15 PM

## 2015-10-13 NOTE — Progress Notes (Signed)
PROGRESS NOTE  Katherine [name] ZOX:[id number] DOB: 1934/05/19 DOA: 09/25/2015 PCP: Enrique SackGREEN, EDWIN JAY, MD Outpatient Specialists:    LOS: 18 days    Brief Narrative:  Katherine [name] is an 82 y.o. 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 Renal was consulted, patient did not respond to conservative management with high-dose Lasix, and eventually required to be started on dialysis on 5/1 .  She subsequently refused. Hemodialysis runs and wanted to become comfort care, she has now changed her mind and as been placed back on hemodialysis. She has had her cyst and nausea and vomiting however after instillation of IV PPI and Reglan pre-meal on 10/08/2015 she is feeling much better on 10/10/2015.   Assessment & Plan:  Acute respiratory resp. failure with hypoxia in the setting of acute on chronic diastolic heart failure EF 55% - due to volume overload from acute diastolic CHF (congestive heart failure) (HCC)/chronic kidney disease stage 3-4/Cardiorenal syndrome, she had dialysis catheter placement by IR and dialysis was started on 10/02/2015. Respiratory status much improved, continue supportive care. Echo reviewed. - Resolved, currently on room air  AKI on chronic kidney disease stage IV with a solitary functioning renal kidney - IR Placed temporary right IJ hemodialysis catheter placement 5/1 with initiation of dialysis on 10/02/2015, - Management per nephrology, patient refused dialysis for 2 days but on 10/08/2015 she now says she has changed her mind as she wants to live, have informed renal, will also involve palliative care to evaluate the patient for goals of  care and CODE STATUS. She currently wants to be full code and now wants to pursue aggressive treatment measures. Dialysis was resumed on 10/09/2015. - Status post right IJ tunneled cuffed dialysis 23 cm catheter and left basilic vein transposition by Dr. Edilia Boickson 10/12/2015  Severe nausea - Significantly improved  Leukocytosis due to UTI  - afebrile, repeat chest x-ray unremarkable, UTI treated with Rocephin.  Essential HTN (hypertension)   - off amlodipine, Coreg and BiDil  - Started on midodrine  Dyslipidemia - Continue statins  Morbid obesity (HCC)    - Advise to follow low calorie diet and to increase physical activity,  Body mass index is 48.68 kg/(m^2  Anemia  - Anemia of chronic kidney disease, status post IV iron.  Depression  - started on low-dose Lexapro  Abdominal fluid collection  - likely chronic, discussed with IR 5/4, it appears without surrounding inflammation and less likely to be infected. Agree with radiology that may be chronic and may not be fully beneficial to drainage at this point.  DM (diabetes mellitus) (HCC) uncontrolled  - currently on Lantus, female NovoLog and sliding scale. A1c was 9. Monitor CBGs.  CBG (last 3)   Recent Labs  10/12/15 2155 10/13/15 0846 10/13/15 1201  GLUCAP 148* 127* 184*      DVT prophylaxis: Heparin  Code Status: Full code  Family Communication:  None at bedside Disposition Plan: SNF, hopefully on Saturday after hemodialysis session   Consultants:   Nephrology  Palliative care Vascular surgery Procedures:    2D echo: EF 55-60%, grade 1 diastolic dysfunction  HD  Foley  Anti-infectives    Start     Dose/Rate Route  Frequency Ordered Stop   10/12/15 1000  ceFAZolin (ANCEF) IVPB 1 g/50 mL premix    Comments:  Send with pt to OR   1 g 100 mL/hr over 30 Minutes Intravenous To [facility] 10/11/15 0945 10/12/15 1100   10/05/15 1800  cefTRIAXone (ROCEPHIN) 1 g in dextrose 5 % 50 mL IVPB  Status:   Discontinued     1 g 100 mL/hr over 30 Minutes Intravenous Every 24 hours 10/05/15 1443 10/09/15 0934      Subjective:  Denies any complaints, no headache, no chest or abdominal pain, no shortness of breath.  Objective: Filed Vitals:   10/12/15 2202 10/12/15 2205 10/13/15 0320 10/13/15 0846  BP:  111/43 126/45 136/48  Pulse:  97 87 90  Temp:  99.1 F (37.3 C) 99.1 F (37.3 C) 98.8 F (37.1 C)  TempSrc:  Oral Oral Oral  Resp:  16 16 17   Height:      Weight: 113 kg (249 lb 1.9 oz)     SpO2:  99% 98% 99%    Intake/Output Summary (Last 24 hours) at 10/13/15 1623 Last data filed at 10/13/15 1050  Gross per 24 hour  Intake    240 ml  Output      0 ml  Net    240 ml   Filed Weights   10/11/15 1057 10/11/15 2101 10/12/15 2202  Weight: 111.5 kg (245 lb 13 oz) 110.4 kg (243 lb 6.2 oz) 113 kg (249 lb 1.9 oz)    Examination:  Filed Vitals:   10/12/15 2202 10/12/15 2205 10/13/15 0320 10/13/15 0846  BP:  111/43 126/45 136/48  Pulse:  97 87 90  Temp:  99.1 F (37.3 C) 99.1 F (37.3 C) 98.8 F (37.1 C)  TempSrc:  Oral Oral Oral  Resp:  16 16 17   Height:      Weight: 113 kg (249 lb 1.9 oz)     SpO2:  99% 98% 99%   Constitutional: NAD Eyes: PERRL, lids and conjunctivae normal ENMT: Mucous membranes are moist. No oropharyngeal exudates Neck: normal, supple, no masses Respiratory: clear to auscultation bilaterally, no wheezing, no crackles. Normal respiratory effort. No accessory muscle use.  Cardiovascular: Regular rate and rhythm, no murmurs / rubs / gallops. No extremity edema. 2+ pedal pulses.  Abdomen: no tenderness. Bowel sounds positive. 10 cm mass anterior aspect  Musculoskeletal: no clubbing / cyanosis.   Neurologic: non focal    Data Reviewed: I have personally reviewed following labs and imaging studies  CBC:  Recent Labs Lab 10/08/15 0622 10/10/15 0703 10/11/15 0724 10/11/15 1615 10/12/15 0934  WBC 11.8* 11.3* 10.1 12.0*  --   HGB 9.4* 9.5* 9.0*  9.3* 10.5*  HCT 29.3* 29.9* 28.7* 30.2* 31.0*  MCV 79.0 80.6 81.1 79.7  --   PLT 176 210 217 212  --    Basic Metabolic Panel:  Recent Labs Lab 10/07/15 0543 10/08/15 0623 10/09/15 0800 10/10/15 0703 10/11/15 0723 10/11/15 0729 10/12/15 0934  NA 136 137 135 140  140 139  --  135  K 4.0 3.9 3.7 4.4  4.4 4.2  --  4.3  CL 96* 96* 98* 100*  100* 100*  --   --   CO2 23 26 26 29  29 29   --   --   GLUCOSE 129* 142* 150* 132*  131* 144*  --  108*  BUN 46* 53* 56* 20  20 28*  --   --   CREATININE 3.80* 4.24* 4.33* 3.44*  3.42* 4.68*  --   --   CALCIUM 8.7* 8.8* 8.6* 9.0  9.0 8.8* 8.4*  --   PHOS 4.4 4.9* 4.3 4.1  4.1 4.4  --   --    GFR: Estimated Creatinine Clearance: 12 mL/min (by C-G formula based on Cr of 4.68). Liver Function Tests:  Recent Labs Lab 10/07/15 0543 10/08/15 0623 10/09/15 0800 10/10/15 0703 10/11/15 0723  ALBUMIN 1.9* 1.9* 2.0* 2.1*  2.2* 2.1*   No results for input(s): LIPASE, AMYLASE in the last 168 hours. No results for input(s): AMMONIA in the last 168 hours. Coagulation Profile: No results for input(s): INR, PROTIME in the last 168 hours. Cardiac Enzymes: No results for input(s): CKTOTAL, CKMB, CKMBINDEX, TROPONINI in the last 168 hours. BNP (last 3 results) No results for input(s): PROBNP in the last 8760 hours. HbA1C: No results for input(s): HGBA1C in the last 72 hours. CBG:  Recent Labs Lab 10/12/15 1354 10/12/15 1545 10/12/15 2155 10-28-2015 0846 10-28-15 1201  GLUCAP 132* 131* 148* 127* 184*   Lipid Profile: No results for input(s): CHOL, HDL, LDLCALC, TRIG, CHOLHDL, LDLDIRECT in the last 72 hours. Thyroid Function Tests: No results for input(s): TSH, T4TOTAL, FREET4, T3FREE, THYROIDAB in the last 72 hours. Anemia Panel: No results for input(s): VITAMINB12, FOLATE, FERRITIN, TIBC, IRON, RETICCTPCT in the last 72 hours. Urine analysis:    Component Value Date/Time   COLORURINE AMBER* 10/05/2015 1229   APPEARANCEUR  TURBID* 10/05/2015 1229   LABSPEC 1.020 10/05/2015 1229   PHURINE 5.0 10/05/2015 1229   GLUCOSEU NEGATIVE 10/05/2015 1229   HGBUR LARGE* 10/05/2015 1229   BILIRUBINUR LARGE* 10/05/2015 1229   KETONESUR 15* 10/05/2015 1229   PROTEINUR >300* 10/05/2015 1229   UROBILINOGEN 1.0 02/11/2014 0913   NITRITE NEGATIVE 10/05/2015 1229   LEUKOCYTESUR LARGE* 10/05/2015 1229   Sepsis Labs:  Invalid input(s): PROCALCITONIN, LACTICIDVEN  Recent Results (from the past 240 hour(s))  MRSA PCR Screening     Status: None   Collection Time: 10/12/15  4:54 AM  Result Value Ref Range Status   MRSA by PCR NEGATIVE NEGATIVE Final    Comment:        The GeneXpert MRSA Assay (FDA approved for NASAL specimens only), is one component of a comprehensive MRSA colonization surveillance program. It is not intended to diagnose MRSA infection nor to guide or monitor treatment for MRSA infections.      Radiology Studies:  Dg Wrist 2 Views Left  10/28/2015  CLINICAL DATA:  Left wrist swelling EXAM: LEFT WRIST - 2 VIEW COMPARISON:  None FINDINGS: The bones appear osteopenic. There is moderate degenerative changes at the basilar joint. Widening of the scaphoid lunar interval is identified and there are degenerative changes at the radiocarpal joint. No underlying fracture or subluxation identified. Diffuse soft tissue swelling noted. IMPRESSION: 1. No acute bone abnormality. 2. Chronic arthropathic changes as above. 3. Soft tissue swelling. Electronically Signed   By: Signa Kell M.D.   On: 10-28-15 10:59   Dg Chest Port 1 View  10/12/2015  CLINICAL DATA:  Dialysis catheter placement EXAM: PORTABLE CHEST 1 VIEW COMPARISON:  Oct 07, 2015 FINDINGS: Dual-lumen catheter tip is in the superior vena cava just proximal to the cavoatrial junction. No pneumothorax. There is slight atelectasis in the left base. Lungs elsewhere clear. Heart is mildly enlarged with pulmonary vascularity within normal limits. No adenopathy.  There is atherosclerotic calcification in the aorta. There is degenerative change in each shoulder. IMPRESSION: Central catheter as described without pneumothorax. Stable  cardiac prominence. Left base atelectasis. Electronically Signed   By: Bretta Bang III M.D.   On: 10/12/2015 14:20   Dg Fluoro Guide Cv Line-no Report  10/12/2015  CLINICAL DATA:  FLOURO GUIDE CV LINE Fluoroscopy was utilized by the requesting physician.  No radiographic interpretation.   Scheduled Meds: . aspirin EC  81 mg Oral Daily  . [START ON 10/14/2015] darbepoetin (ARANESP) injection - DIALYSIS  100 mcg Intravenous Q Sat-HD  . docusate sodium  200 mg Oral QHS  . [START ON 10/14/2015] doxercalciferol  1 mcg Intravenous Q T,Th,Sa-HD  . escitalopram  5 mg Oral Daily  . guaiFENesin  600 mg Oral BID  . heparin  5,000 Units Subcutaneous Q8H  . insulin aspart  0-9 Units Subcutaneous TID WC  . insulin aspart  3 Units Subcutaneous TID WC  . insulin glargine  8 Units Subcutaneous Daily  . metoCLOPramide (REGLAN) injection  10 mg Intravenous TID PC  . [START ON 10/14/2015] midodrine  10 mg Oral Q T,Th,Sa-HD  . multivitamin  1 tablet Oral QHS  . pantoprazole  40 mg Oral BID  . pneumococcal 23 valent vaccine  0.5 mL Intramuscular Tomorrow-1000  . rosuvastatin  10 mg Oral q1800    Leida Lauth, Kalei Mckillop M.D on 10/13/2015 at 4:23 PM  Between 7am to 7pm - Pager - [phone], After 7pm go to [email or web address] - password [facility]  [facility]  - Office  [phone]

## 2015-10-13 NOTE — Progress Notes (Signed)
   VASCULAR SURGERY ASSESSMENT & PLAN:   1 Day Post-Op s/p: Placement of tunneled dialysis catheter and left basilic vein transposition.   The fistula has an excellent thrill. The left hand is warm and well-perfused. I will arrange a follow up visit in 6 weeks with a duplex at that time to check on the maturation of the fistula.   She has some swelling on the ulnar aspect of her left wrist which looks like potentially a hematoma. There was no intervention here and its possible that this was related to tucking her arm during placement of her catheter. I would simply keep an eye on this. This should resolve with time.  SUBJECTIVE: No complaints  PHYSICAL EXAM: Filed Vitals:   10/12/15 1549 10/12/15 2202 10/12/15 2205 10/13/15 0320  BP: 128/46  111/43 126/45  Pulse: 100  97 87  Temp: 98.8 F (37.1 C)  99.1 F (37.3 C) 99.1 F (37.3 C)  TempSrc: Oral  Oral Oral  Resp: 16  16 16   Height:      Weight:  249 lb 1.9 oz (113 kg)    SpO2: 100%  99% 98%   Her left basilic vein transposition has an excellent thrill. Her incisions are healing nicely. She does appear to have a hematoma on the ulnar aspect of her left wrist.  LABS: Lab Results  Component Value Date   WBC 12.0* 10/11/2015   HGB 10.5* 10/12/2015   HCT 31.0* 10/12/2015   MCV 79.7 10/11/2015   PLT 212 10/11/2015   Lab Results  Component Value Date   CREATININE 4.68* 10/11/2015   Lab Results  Component Value Date   INR 1.03 08/21/2015   CBG (last 3)   Recent Labs  10/12/15 1354 10/12/15 1545 10/12/15 2155  GLUCAP 132* 131* 148*    Active Problems:   DM (diabetes mellitus) (HCC)   HTN (hypertension)   Dyslipidemia   Morbid obesity (HCC)   CKD (chronic kidney disease) stage 2, GFR 60-89 ml/min   Acute respiratory failure with hypoxia (HCC)   Acute diastolic CHF (congestive heart failure) (HCC)   ESRD on dialysis [facility](HCC)   Encounter for palliative care   Goals of care, counseling/discussion   Cari CarawayChris  Dickson Beeper: [phone] 10/13/2015

## 2015-10-13 NOTE — Clinical Social Work Note (Signed)
[facility] and contacted liaison re: possible Saturday dc per MD note.  SNF agreeable.  Vickii PennaGina Kamryn Gauthier, LCSW [phone]  2H 1-14; [facility] Licensed Clinical Social Worker

## 2015-10-13 NOTE — Care Management Important Message (Signed)
Important Message  Patient Details  Name: Katherine [name] MRN: [id number] Date of Birth: 10-Apr-1934   Medicare Important Message Given:  Yes    Katherine [name] 10/13/2015, 1:31 PM

## 2015-10-13 NOTE — Progress Notes (Signed)
Physical Therapy Treatment Patient Details Name: Katherine [name] MRN: [id number] DOB: 1933-11-17 Today's Date: 10/13/2015    History of Present Illness 82 y.o. female with h/o B TKA, HTN, DM, CKD, CHF admitted with fall, increased BLE edema, weakness, SOB. Dx of pulmonary edema.     PT Comments    Her energy and endurance today are limited, had to sit a significant time bedside to prepare for transition to chair, after extensive time to get to side of bed.  Her effort to step to chair is good but is definitely a two person endeavor for gait of any real distance.  Will continue to work on her strength and standing control for daily therapy until discharge.  Follow Up Recommendations  SNF     Equipment Recommendations  Rolling walker with 5" wheels;3in1 (PT)    Recommendations for Other Services OT consult     Precautions / Restrictions Precautions Precautions: Fall Restrictions Weight Bearing Restrictions: No    Mobility  Bed Mobility Overal bed mobility: Needs Assistance Bed Mobility: Supine to Sit Rolling: Min assist         General bed mobility comments: cues and assist under trunk, then a bit under hips  Transfers Overall transfer level: Needs assistance Equipment used: Rolling walker (2 wheeled) Transfers: Sit to/from [employer] Sit to Stand: Mod assist Stand pivot transfers: Min assist;Mod assist       General transfer comment: down to one for assist, uses vc's for hand placement  Ambulation/Gait Ambulation/Gait assistance: Min assist;Mod assist Ambulation Distance (Feet): 3 Feet Assistive device: Rolling walker (2 wheeled) Gait Pattern/deviations: Step-through pattern;Step-to pattern;Wide base of support;Trunk flexed Gait velocity: reduced Gait velocity interpretation: Below normal speed for age/gender General Gait Details: sidestepping to the chair wiht cues for lining up to sit   Stairs            Wheelchair Mobility     Modified Rankin (Stroke Patients Only)       Balance Overall balance assessment: Needs assistance Sitting-balance support: Feet supported Sitting balance-Leahy Scale: Good   Postural control: Posterior lean Standing balance support: Bilateral upper extremity supported Standing balance-Leahy Scale: Fair Standing balance comment: fair- for dynamic standing                    Cognition Arousal/Alertness: Awake/alert Behavior During Therapy: WFL for tasks assessed/performed Overall Cognitive Status: Within Functional Limits for tasks assessed                      Exercises General Exercises - Lower Extremity Ankle Circles/Pumps: AROM;Both;5 reps Long Arc Quad: Strengthening;Both;10 reps Heel Slides: Strengthening;Both;10 reps Hip ABduction/ADduction: Strengthening;Both;10 reps Straight Leg Raises: Strengthening;Both;AAROM;10 reps    General Comments General comments (skin integrity, edema, etc.): Pt is up in the chair with mod assist of PT and noted her difficulty with standing endurance and control of LE's to walk.  Her plan is stilll SNF when finished with hospital.      Pertinent Vitals/Pain Pain Assessment: No/denies pain    Home Living                      Prior Function            PT Goals (current goals can now be found in the care plan section) Acute Rehab PT Goals Patient Stated Goal: to be strong enough to go home Progress towards PT goals: Progressing toward goals    Frequency  Min 3X/week  PT Plan Current plan remains appropriate    Co-evaluation             End of Session Equipment Utilized During Treatment: Gait belt;Oxygen Activity Tolerance: Patient limited by fatigue Patient left: in chair;with call bell/phone within reach     Time: 1347-1430 PT Time Calculation (min) (ACUTE ONLY): 43 min  Charges:  $Therapeutic Exercise: 8-22 mins $Therapeutic Activity: 23-37 mins                    G Codes:       Allysen Lazo E 10/13/2015, 3:19 Ivar DrapeM   Samul Dadauth Abhi Moccia, PT MS Acute Rehab Dept. Number: [facility] [phone] and [facility] [phone]

## 2015-10-14 LAB — RENAL FUNCTION PANEL
ANION GAP: 12 (ref 5–15)
Albumin: 2.3 g/dL — ABNORMAL LOW (ref 3.5–5.0)
BUN: 33 mg/dL — ABNORMAL HIGH (ref 6–20)
CHLORIDE: 100 mmol/L — AB (ref 101–111)
CO2: 26 mmol/L (ref 22–32)
Calcium: 8.9 mg/dL (ref 8.9–10.3)
Creatinine, Ser: 5.6 mg/dL — ABNORMAL HIGH (ref 0.44–1.00)
GFR calc Af Amer: 8 mL/min — ABNORMAL LOW (ref >60)
GFR calc non Af Amer: 6 mL/min — ABNORMAL LOW (ref >60)
GLUCOSE: 118 mg/dL — AB (ref 65–99)
POTASSIUM: 3.8 mmol/L (ref 3.5–5.1)
Phosphorus: 5.1 mg/dL — ABNORMAL HIGH (ref 2.5–4.6)
Sodium: 138 mmol/L (ref 135–145)

## 2015-10-14 LAB — CBC
HCT: 26.9 % — ABNORMAL LOW (ref 36.0–46.0)
HEMOGLOBIN: 8.3 g/dL — AB (ref 12.0–15.0)
MCH: 24.3 pg — AB (ref 26.0–34.0)
MCHC: 30.9 g/dL (ref 30.0–36.0)
MCV: 78.7 fL (ref 78.0–100.0)
Platelets: 254 10*3/uL (ref 150–400)
RBC: 3.42 MIL/uL — ABNORMAL LOW (ref 3.87–5.11)
RDW: 18.1 % — ABNORMAL HIGH (ref 11.5–15.5)
WBC: 11.9 10*3/uL — ABNORMAL HIGH (ref 4.0–10.5)

## 2015-10-14 LAB — CBC AND DIFFERENTIAL: WBC: 11.9 10*3/mL

## 2015-10-14 LAB — BASIC METABOLIC PANEL
BUN: 38 mg/dL — AB (ref 4–21)
Creatinine: 5.6 mg/dL — AB (ref 0.5–1.1)
Glucose: 118 mg/dL
Sodium: 138 mmol/L (ref 137–147)

## 2015-10-14 LAB — GLUCOSE, CAPILLARY
Glucose-Capillary: 107 mg/dL — ABNORMAL HIGH (ref 65–99)
Glucose-Capillary: 109 mg/dL — ABNORMAL HIGH (ref 65–99)

## 2015-10-14 MED ORDER — MIDODRINE HCL 10 MG PO TABS: 10.0000 mg | ORAL_TABLET | ORAL | Status: AC

## 2015-10-14 MED ORDER — INSULIN ASPART 100 UNIT/ML ~~LOC~~ SOLN: 0.0000 [IU] | Freq: Three times a day (TID) | SUBCUTANEOUS | Status: DC

## 2015-10-14 MED ORDER — INSULIN GLARGINE 100 UNIT/ML ~~LOC~~ SOLN: 8.0000 [IU] | Freq: Every day | SUBCUTANEOUS | Status: DC

## 2015-10-14 MED ORDER — DARBEPOETIN ALFA 100 MCG/0.5ML IJ SOSY
PREFILLED_SYRINGE | INTRAMUSCULAR | Status: AC
  Filled 2015-10-14: qty 0.5

## 2015-10-14 MED ORDER — LACTULOSE 10 GM/15ML PO SOLN
30.0000 g | Freq: Every day | ORAL | Status: DC | PRN
  Filled 2015-10-14: qty 45

## 2015-10-14 MED ORDER — MIDODRINE HCL 5 MG PO TABS
ORAL_TABLET | ORAL | Status: AC
  Filled 2015-10-14: qty 2

## 2015-10-14 MED ORDER — DOXERCALCIFEROL 4 MCG/2ML IV SOLN
INTRAVENOUS | Status: AC
  Administered 2015-10-14: 1 ug via INTRAVENOUS
  Filled 2015-10-14: qty 2

## 2015-10-14 MED ORDER — ESCITALOPRAM OXALATE 5 MG PO TABS: 5.0000 mg | ORAL_TABLET | Freq: Every day | ORAL | Status: AC

## 2015-10-14 MED ORDER — PANTOPRAZOLE SODIUM 40 MG PO TBEC: 40.0000 mg | DELAYED_RELEASE_TABLET | Freq: Every day | ORAL | Status: AC

## 2015-10-14 MED ORDER — LACTULOSE 10 GM/15ML PO SOLN: 30.0000 g | Freq: Every day | ORAL | Status: DC | PRN

## 2015-10-14 MED ORDER — SENNOSIDES-DOCUSATE SODIUM 8.6-50 MG PO TABS: 2.0000 | ORAL_TABLET | Freq: Two times a day (BID) | ORAL | Status: DC

## 2015-10-14 MED ORDER — RENA-VITE PO TABS: 1.0000 | ORAL_TABLET | Freq: Every day | ORAL | Status: AC

## 2015-10-14 MED ORDER — POLYETHYLENE GLYCOL 3350 17 G PO PACK: 17.0000 g | PACK | Freq: Every day | ORAL | Status: AC | PRN

## 2015-10-14 NOTE — Progress Notes (Addendum)
Patient ID: NESIAH JUMP, female   DOB: 08-13-1933, 82 y.o.   MRN: [id number]  [facility] Progress Note    Subjective:   Had left BVT AVF and TDC done 5/11/ by Dr. Scot Dock - he is happy with results No new issues On HD now Getting midodrine pre HD for BP support    Objective:   BP 139/66 mmHg  Pulse 101  Temp(Src) 98.2 F (36.8 C) (Oral)  Resp 18  Ht _0  (1.626 m)  Wt 118 kg (260 lb 2.3 oz)  BMI 44.63 kg/m2  SpO2 99%  Intake/Output Summary (Last 24 hours) at 10/14/15 1054 Last data filed at 10/14/15 0702  Gross per 24 hour  Intake    480 ml  Output      0 ml  Net    480 ml   Weight change: 2.4 kg (5 lb 4.7 oz)  Physical Exam: Gen: alert, awake BP 139/66 mmHg  Pulse 101  Temp(Src) 98.2 F (36.8 C) (Oral)  Resp 18  Ht _1  (1.626 m)  Wt 118 kg (260 lb 2.3 oz)  BMI 44.63 kg/m2  SpO2 99%  CVS: Regular rhythm, normal rate, S1 and S2 normal  Resp: Clear lungs bilat, no rales/ rhonchi Abd: Soft, obese, nontender, firm nontender mass midabdomen Ext: minimal / trace lower extremity edema bilaterally Marked area RLE (from fall PTA) still with some nodular swelling New R TDC R IJ (5/11) L BVT AVF + bruit. Some bruising, hand warm Hematoma/swelling over left wrist   CXR 4/24 bilat effusions layering, vasc congestion, c/w chf CXR 5/4 resolved effusions/ CHF, some residual IS prominence   Recent Labs Lab 10/08/15 0623 10/09/15 0800 10/10/15 0703 10/11/15 0723 10/11/15 0729 10/12/15 0934 10/14/15 0843  NA 137 135 140  140 139  --  135 138  K 3.9 3.7 4.4  4.4 4.2  --  4.3 3.8  CL 96* 98* 100*  100* 100*  --   --  100*  CO2 _2 --   --  26  GLUCOSE 142* 150* 132*  131* 144*  --  108* 118*  BUN 53* 56* 20  20 28*  --   --  33*  CREATININE 4.24* 4.33* 3.44*  3.42* 4.68*  --   --  5.60*  CALCIUM 8.8* 8.6* 9.0  9.0 8.8* 8.4*  --  8.9  PHOS 4.9* 4.3 4.1  4.1 4.4  --   --  5.1*   PTH  Date Value Ref Range Status   10/11/2015 392* 15 - 65 pg/mL Final     Recent Labs Lab 10/10/15 0703 10/11/15 0724 10/11/15 1615 10/12/15 0934 10/14/15 0844  WBC 11.3* 10.1 12.0*  --  11.9*  HGB 9.5* 9.0* 9.3* 10.5* 8.3*  HCT 29.9* 28.7* 30.2* 31.0* 26.9*  MCV 80.6 81.1 79.7  --  78.7  PLT 210 217 212  --  254    Dg Wrist 2 Views Left  10/13/2015  CLINICAL DATA:  Left wrist swelling EXAM: LEFT WRIST - 2 VIEW COMPARISON:  None FINDINGS: The bones appear osteopenic. There is moderate degenerative changes at the basilar joint. Widening of the scaphoid lunar interval is identified and there are degenerative changes at the radiocarpal joint. No underlying fracture or subluxation identified. Diffuse soft tissue swelling noted. IMPRESSION: 1. No acute bone abnormality. 2. Chronic arthropathic changes as above. 3. Soft tissue swelling. Electronically Signed   By: Kerby Moors M.D.   On: 10/13/2015  10:59   Dg Chest Port 1 View  10/12/2015  CLINICAL DATA:  Dialysis catheter placement EXAM: PORTABLE CHEST 1 VIEW COMPARISON:  Oct 07, 2015 FINDINGS: Dual-lumen catheter tip is in the superior vena cava just proximal to the cavoatrial junction. No pneumothorax. There is slight atelectasis in the left base. Lungs elsewhere clear. Heart is mildly enlarged with pulmonary vascularity within normal limits. No adenopathy. There is atherosclerotic calcification in the aorta. There is degenerative change in each shoulder. IMPRESSION: Central catheter as described without pneumothorax. Stable cardiac prominence. Left base atelectasis. Electronically Signed   By: Lowella Grip III M.D.   On: 10/12/2015 14:20   Dg Fluoro Guide Cv Line-no Report  10/12/2015  CLINICAL DATA:  FLOURO GUIDE CV LINE Fluoroscopy was utilized by the requesting physician.  No radiographic interpretation.   Medications:    . aspirin EC  81 mg Oral Daily  . darbepoetin (ARANESP) injection - DIALYSIS  100 mcg Intravenous Q Sat-HD  . docusate sodium  200 mg Oral  QHS  . doxercalciferol  1 mcg Intravenous Q T,Th,Sa-HD  . escitalopram  5 mg Oral Daily  . guaiFENesin  600 mg Oral BID  . heparin  5,000 Units Subcutaneous Q8H  . insulin aspart  0-9 Units Subcutaneous TID WC  . insulin aspart  3 Units Subcutaneous TID WC  . insulin glargine  8 Units Subcutaneous Daily  . metoCLOPramide (REGLAN) injection  10 mg Intravenous TID PC  . midodrine  10 mg Oral Q T,Th,Sa-HD  . multivitamin  1 tablet Oral QHS  . pantoprazole  40 mg Oral BID  . pneumococcal 23 valent vaccine  0.5 mL Intramuscular Tomorrow-1000  . rosuvastatin  10 mg Oral q1800  . senna-docusate  1 tablet Oral BID   Background 82 y.o. year-old AAF with DM, HTN, HLD, known CKD with solitary functioning kidney who has AKI on CKD in the setting of acute diastolic heart failure. She was seen by Dr. Moshe Cipro 07/2015 for an upward trending of creatinine (see table below). Pt was on losartan, and was taking Aleve which she was asked to stop. Lasix 40 mg BID was added for edema. When labs were rechecked a month later, creatinine was up to 3.22 and she was then asked to stop her lasix, losartan. Creatinine after that change dropped to 1.95 but when she stopped the furosemide, she started swelling with development of pitting edema to the thighs, and became more SOB. She had a fall at home on 4/24 and called EMS for help getting up - noted to be SOB with low O2 sats so was brought to the hospital and admitted for treatment of CHF. She did not respond to ceiling dose diuretics so was transferred from [facility] for dialysis. 1st HD TMT 5/1, then had some hesitation about continuing, has met with Palliative Care and is agreeable to proceed with long term HD  Assessment:   1. AKI/ CKD 4/ solitary functioning kidney->new ESRD: tipped over into ESRD by cardiorenal syndrome. Has done well with HD. Minimal UOP since starting dialysis. Has met with palliative team is moving ahead w full medical care, including dialysis. She  understands that it will be her decision down the road whether or not to continue if quality of life not what she wants. She is now  s/p tunneled cath and perm access 5/11 by [facility] . CLIP - has TTS 2nd shift chair at [facility] and can start next Tuesday. HD today will get her on schedule  for outpt. Have added  midodrine with HD. Post weight 115.1 kg with some persistent edema. Will use 114 kg as EDW for now.  2. Vol excess/ pulm edema- resolved clinically, on room air. CXR improved. Weight down 13 kg from highest weight this admission. Still with LE edema on exam but BP limits much furrther UF.  3. Orthostatic hypotension - added TED hose. Added midodrine for BP on HD.  4. CKD-MBD - no binders needed yet. PTH 392 - started hectorol with HD 1 mcg TIW today 5/13 5. Anemia: sp feraheme x 1 on 4/27 (510 mg, tfs was 5%); started darbe 100/ wk dosed today 5/13.  6. Hypertension: Off all BP meds now with soft BP's and orthostasis 7. DM on insulin 8. Nausea - Marked improvement with Reglan. Still getting IV - ? Transition to po? 9. UTI/ fever/ [place] - completed a course of rocephin. WBC 12K 10. L wrist swelling - probable hematoma related to tucked positioning in OR for TDC . 11. Left BVT AVF, R IJ TDC (both 5/11 Dr. Scot Dock)   Jamal Maes, MD [place] [phone] Pager 10/10/2015, 2:15 PM

## 2015-10-14 NOTE — Clinical Social Work Note (Signed)
Patient to be d/c'ed today to [facility].  [facility] to call report to [phone] room [id number] [address].  Windell MouldingEric Myreon Wimer, MSW, Theresia MajorsLCSWA [phone]

## 2015-10-14 NOTE — Discharge Instructions (Signed)
Follow with Primary MD Katherine [name], Katherine IshiharaEDWIN JAY, MD after discharge from SNF  Get CBC, CMP,checked  by Primary MD next visit.    Activity: As tolerated with Full fall precautions use walker/cane & assistance as needed   Disposition SNF   Diet: Renal modified with 1200 mL fluid restriction, with feeding assistance and aspiration precautions.  For Heart failure patients - Check your Weight same time everyday, if you gain over 2 pounds, or you develop in leg swelling, experience more shortness of breath or chest pain, call your Primary MD immediately. Follow Cardiac Low Salt Diet and 1.5 lit/day fluid restriction.   On your next visit with your primary care physician please Get Medicines reviewed and adjusted.   Please request your Prim.MD to go over all Hospital Tests and Procedure/Radiological results at the follow up, please get all Hospital records sent to your Prim MD by signing hospital release before you go home.   If you experience worsening of your admission symptoms, develop shortness of breath, life threatening emergency, suicidal or homicidal thoughts you must seek medical attention immediately by calling 911 or calling your MD immediately  if symptoms less severe.  You Must read complete instructions/literature along with all the possible adverse reactions/side effects for all the Medicines you take and that have been prescribed to you. Take any new Medicines after you have completely understood and accpet all the possible adverse reactions/side effects.   Do not drive, operating heavy machinery, perform activities at heights, swimming or participation in water activities or provide baby sitting services if your were admitted for syncope or siezures until you have seen by Primary MD or a Neurologist and advised to do so again.  Do not drive when taking Pain medications.    Do not take more than prescribed Pain, Sleep and Anxiety Medications  Special Instructions: If you have smoked  or chewed Tobacco  in the last 2 yrs please stop smoking, stop any regular Alcohol  and or any Recreational drug use.  Wear Seat belts while driving.   Please note  You were cared for by a hospitalist during your hospital stay. If you have any questions about your discharge medications or the care you received while you were in the hospital after you are discharged, you can call the unit and asked to speak with the hospitalist on call if the hospitalist that took care of you is not available. Once you are discharged, your primary care physician will handle any further medical issues. Please note that NO REFILLS for any discharge medications will be authorized once you are discharged, as it is imperative that you return to your primary care physician (or establish a relationship with a primary care physician if you do not have one) for your aftercare needs so that they can reassess your need for medications and monitor your lab values.

## 2015-10-14 NOTE — Progress Notes (Signed)
[facility]Karen Hall[facility]

## 2015-10-14 NOTE — Discharge Summary (Signed)
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  ELGERGAWY, DAWOOD M.D on 10/14/2015 at 1:59 PM  [facility]   Office  [phone]

## 2015-10-14 NOTE — Progress Notes (Signed)
Left message for pt's daughter, Paul DykesDoreen, to let her know pt has left for SNF.

## 2015-10-14 NOTE — Procedures (Signed)
I have personally attended this patient's dialysis session.  Did not get midodrine Weight up to 118 and want to UF a liter Give midodrine now 4K bath for K 3.8  Katherine Balynthia Ravleen Ries, MD [facility] [phone] Pager 10/14/2015, 11:10 AM

## 2015-10-16 ENCOUNTER — Non-Acute Institutional Stay (SKILLED_NURSING_FACILITY): Payer: Medicare Other | Admitting: Adult Health

## 2015-10-16 ENCOUNTER — Telehealth: Payer: Self-pay | Admitting: *Deleted

## 2015-10-16 ENCOUNTER — Encounter: Payer: Self-pay | Admitting: Adult Health

## 2015-10-16 DIAGNOSIS — K219 Gastro-esophageal reflux disease without esophagitis: Secondary | ICD-10-CM | POA: Diagnosis not present

## 2015-10-16 DIAGNOSIS — K5901 Slow transit constipation: Secondary | ICD-10-CM | POA: Diagnosis not present

## 2015-10-16 DIAGNOSIS — E1122 Type 2 diabetes mellitus with diabetic chronic kidney disease: Secondary | ICD-10-CM

## 2015-10-16 DIAGNOSIS — D72829 Elevated white blood cell count, unspecified: Secondary | ICD-10-CM

## 2015-10-16 DIAGNOSIS — R5381 Other malaise: Secondary | ICD-10-CM | POA: Diagnosis not present

## 2015-10-16 DIAGNOSIS — F32A Depression, unspecified: Secondary | ICD-10-CM

## 2015-10-16 DIAGNOSIS — Z992 Dependence on renal dialysis: Secondary | ICD-10-CM

## 2015-10-16 DIAGNOSIS — N186 End stage renal disease: Secondary | ICD-10-CM

## 2015-10-16 DIAGNOSIS — I953 Hypotension of hemodialysis: Secondary | ICD-10-CM

## 2015-10-16 DIAGNOSIS — E785 Hyperlipidemia, unspecified: Secondary | ICD-10-CM

## 2015-10-16 DIAGNOSIS — D631 Anemia in chronic kidney disease: Secondary | ICD-10-CM

## 2015-10-16 DIAGNOSIS — Z794 Long term (current) use of insulin: Secondary | ICD-10-CM

## 2015-10-16 DIAGNOSIS — N184 Chronic kidney disease, stage 4 (severe): Secondary | ICD-10-CM

## 2015-10-16 DIAGNOSIS — E43 Unspecified severe protein-calorie malnutrition: Secondary | ICD-10-CM

## 2015-10-16 DIAGNOSIS — N189 Chronic kidney disease, unspecified: Secondary | ICD-10-CM

## 2015-10-16 DIAGNOSIS — I150 Renovascular hypertension: Secondary | ICD-10-CM | POA: Diagnosis not present

## 2015-10-16 DIAGNOSIS — F329 Major depressive disorder, single episode, unspecified: Secondary | ICD-10-CM | POA: Diagnosis not present

## 2015-10-16 NOTE — Telephone Encounter (Signed)
Confirmed with [facility] therapy, No lifting of any objects greater than 2-3 lbs until seen for postoperative appt with Dr. Edilia Boickson.
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
## 2015-10-19 LAB — BASIC METABOLIC PANEL
BUN: 23 mg/dL — AB (ref 4–21)
CREATININE: 5.9 mg/dL — AB (ref 0.5–1.1)
Glucose: 135 mg/dL
Potassium: 3.1 mmol/L — AB (ref 3.4–5.3)
Sodium: 140 mmol/L (ref 137–147)

## 2015-10-19 LAB — CBC AND DIFFERENTIAL
HCT: 30 % — AB (ref 36–46)
Hemoglobin: 9.3 g/dL — AB (ref 12.0–16.0)
PLATELETS: 191 10*3/uL (ref 150–399)
WBC: 9 10*3/mL

## 2015-10-20 ENCOUNTER — Non-Acute Institutional Stay (SKILLED_NURSING_FACILITY): Payer: Medicare Other | Admitting: Internal Medicine

## 2015-10-20 ENCOUNTER — Encounter: Payer: Self-pay | Admitting: Internal Medicine

## 2015-10-20 DIAGNOSIS — J810 Acute pulmonary edema: Secondary | ICD-10-CM | POA: Diagnosis not present

## 2015-10-20 DIAGNOSIS — Z992 Dependence on renal dialysis: Secondary | ICD-10-CM | POA: Diagnosis not present

## 2015-10-20 DIAGNOSIS — N184 Chronic kidney disease, stage 4 (severe): Secondary | ICD-10-CM | POA: Diagnosis not present

## 2015-10-20 DIAGNOSIS — N186 End stage renal disease: Secondary | ICD-10-CM | POA: Diagnosis not present

## 2015-10-20 DIAGNOSIS — I150 Renovascular hypertension: Secondary | ICD-10-CM | POA: Diagnosis not present

## 2015-10-20 DIAGNOSIS — I5031 Acute diastolic (congestive) heart failure: Secondary | ICD-10-CM | POA: Diagnosis not present

## 2015-10-20 DIAGNOSIS — E1165 Type 2 diabetes mellitus with hyperglycemia: Secondary | ICD-10-CM

## 2015-10-20 DIAGNOSIS — J9601 Acute respiratory failure with hypoxia: Secondary | ICD-10-CM

## 2015-10-20 DIAGNOSIS — IMO0002 Reserved for concepts with insufficient information to code with codable children: Secondary | ICD-10-CM

## 2015-10-20 DIAGNOSIS — E1122 Type 2 diabetes mellitus with diabetic chronic kidney disease: Secondary | ICD-10-CM

## 2015-10-20 DIAGNOSIS — Z794 Long term (current) use of insulin: Secondary | ICD-10-CM | POA: Diagnosis not present
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
## 2015-10-23 ENCOUNTER — Telehealth: Payer: Self-pay | Admitting: Vascular Surgery

## 2015-10-23 NOTE — Telephone Encounter (Signed)
-----   Message from Phillips Odorarol S Pullins, RN sent at 10/12/2015  2:06 PM EDT ----- Regarding: needs 6 week f/u with Dr. Edilia Boickson with duplex of left arm access   ----- Message -----    From: Chuck Hinthristopher S Dickson, MD    Sent: 10/12/2015   1:48 PM      To: [facility] Charge Pool Subject: charge                                         PROCEDURE:  1.  Placement of right IJ tunneled cuffed dialysis 23 cm catheter 2. Left basilic vein transposition  SURGEON: Di Kindlehristopher S. Edilia Boickson, MD, FACS  ASSIST: Doreatha MassedSamantha Rhyne, PA, Lianne CureMaureen Collins PA. She will need a follow up visit in 6 weeks to check on the maturation of the fistula. She will need to have a duplex at that time. Thank you. CD

## 2015-10-23 NOTE — Telephone Encounter (Signed)
Sched apt 6/28; lab at 9 and MD at 9:45. Spoke to pt to inform them of appt.

## 2015-11-21 ENCOUNTER — Non-Acute Institutional Stay (SKILLED_NURSING_FACILITY): Payer: Medicare Other | Admitting: Adult Health

## 2015-11-21 ENCOUNTER — Encounter: Payer: Self-pay | Admitting: Adult Health

## 2015-11-21 DIAGNOSIS — E1122 Type 2 diabetes mellitus with diabetic chronic kidney disease: Secondary | ICD-10-CM | POA: Diagnosis not present

## 2015-11-21 DIAGNOSIS — F32A Depression, unspecified: Secondary | ICD-10-CM

## 2015-11-21 DIAGNOSIS — I150 Renovascular hypertension: Secondary | ICD-10-CM | POA: Diagnosis not present

## 2015-11-21 DIAGNOSIS — I953 Hypotension of hemodialysis: Secondary | ICD-10-CM | POA: Diagnosis not present

## 2015-11-21 DIAGNOSIS — D72829 Elevated white blood cell count, unspecified: Secondary | ICD-10-CM | POA: Diagnosis not present

## 2015-11-21 DIAGNOSIS — D631 Anemia in chronic kidney disease: Secondary | ICD-10-CM

## 2015-11-21 DIAGNOSIS — R5381 Other malaise: Secondary | ICD-10-CM | POA: Diagnosis not present

## 2015-11-21 DIAGNOSIS — Z992 Dependence on renal dialysis: Secondary | ICD-10-CM

## 2015-11-21 DIAGNOSIS — K219 Gastro-esophageal reflux disease without esophagitis: Secondary | ICD-10-CM | POA: Diagnosis not present

## 2015-11-21 DIAGNOSIS — E43 Unspecified severe protein-calorie malnutrition: Secondary | ICD-10-CM

## 2015-11-21 DIAGNOSIS — N184 Chronic kidney disease, stage 4 (severe): Secondary | ICD-10-CM

## 2015-11-21 DIAGNOSIS — E785 Hyperlipidemia, unspecified: Secondary | ICD-10-CM

## 2015-11-21 DIAGNOSIS — F329 Major depressive disorder, single episode, unspecified: Secondary | ICD-10-CM | POA: Diagnosis not present

## 2015-11-21 DIAGNOSIS — K5901 Slow transit constipation: Secondary | ICD-10-CM

## 2015-11-21 DIAGNOSIS — N186 End stage renal disease: Secondary | ICD-10-CM | POA: Diagnosis not present

## 2015-11-21 DIAGNOSIS — N189 Chronic kidney disease, unspecified: Secondary | ICD-10-CM | POA: Diagnosis not present

## 2015-11-21 DIAGNOSIS — I5031 Acute diastolic (congestive) heart failure: Secondary | ICD-10-CM

## 2015-11-21 DIAGNOSIS — Z794 Long term (current) use of insulin: Secondary | ICD-10-CM
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
## 2015-11-22 ENCOUNTER — Encounter: Payer: Self-pay | Admitting: Vascular Surgery

## 2015-11-28 ENCOUNTER — Emergency Department (HOSPITAL_COMMUNITY)
Admission: EM | Admit: 2015-11-28 | Discharge: 2015-11-28 | Disposition: A | Discharge status: Home / Self Care | Payer: Medicare Other | Attending: Emergency Medicine | Admitting: Emergency Medicine

## 2015-11-28 ENCOUNTER — Encounter (HOSPITAL_COMMUNITY): Payer: Self-pay | Admitting: Emergency Medicine

## 2015-11-28 ENCOUNTER — Emergency Department (HOSPITAL_COMMUNITY): Payer: Medicare Other

## 2015-11-28 DIAGNOSIS — Z7982 Long term (current) use of aspirin: Secondary | ICD-10-CM | POA: Diagnosis not present

## 2015-11-28 DIAGNOSIS — N186 End stage renal disease: Secondary | ICD-10-CM | POA: Diagnosis not present

## 2015-11-28 DIAGNOSIS — I132 Hypertensive heart and chronic kidney disease with heart failure and with stage 5 chronic kidney disease, or end stage renal disease: Secondary | ICD-10-CM | POA: Insufficient documentation

## 2015-11-28 DIAGNOSIS — Z794 Long term (current) use of insulin: Secondary | ICD-10-CM | POA: Insufficient documentation

## 2015-11-28 DIAGNOSIS — Z96653 Presence of artificial knee joint, bilateral: Secondary | ICD-10-CM | POA: Insufficient documentation

## 2015-11-28 DIAGNOSIS — R112 Nausea with vomiting, unspecified: Secondary | ICD-10-CM | POA: Diagnosis not present

## 2015-11-28 DIAGNOSIS — Z87891 Personal history of nicotine dependence: Secondary | ICD-10-CM | POA: Diagnosis not present

## 2015-11-28 DIAGNOSIS — R197 Diarrhea, unspecified: Secondary | ICD-10-CM | POA: Diagnosis not present

## 2015-11-28 DIAGNOSIS — Z79899 Other long term (current) drug therapy: Secondary | ICD-10-CM | POA: Insufficient documentation

## 2015-11-28 DIAGNOSIS — Z91010 Allergy to peanuts: Secondary | ICD-10-CM | POA: Diagnosis not present

## 2015-11-28 DIAGNOSIS — I509 Heart failure, unspecified: Secondary | ICD-10-CM | POA: Insufficient documentation

## 2015-11-28 DIAGNOSIS — Z992 Dependence on renal dialysis: Secondary | ICD-10-CM | POA: Insufficient documentation

## 2015-11-28 DIAGNOSIS — E860 Dehydration: Secondary | ICD-10-CM | POA: Insufficient documentation

## 2015-11-28 LAB — CBC WITH DIFFERENTIAL/PLATELET
BASOS PCT: 0 %
Basophils Absolute: 0 10*3/uL (ref 0.0–0.1)
EOS ABS: 0.1 10*3/uL (ref 0.0–0.7)
EOS PCT: 1 %
HCT: 36.4 % (ref 36.0–46.0)
Hemoglobin: 12.2 g/dL (ref 12.0–15.0)
LYMPHS ABS: 0.9 10*3/uL (ref 0.7–4.0)
Lymphocytes Relative: 16 %
MCH: 24.8 pg — AB (ref 26.0–34.0)
MCHC: 33.5 g/dL (ref 30.0–36.0)
MCV: 74 fL — ABNORMAL LOW (ref 78.0–100.0)
MONOS PCT: 15 %
Monocytes Absolute: 0.9 10*3/uL (ref 0.1–1.0)
NEUTROS PCT: 67 %
Neutro Abs: 3.8 10*3/uL (ref 1.7–7.7)
Platelets: 196 10*3/uL (ref 150–400)
RBC: 4.92 MIL/uL (ref 3.87–5.11)
RDW: 16.8 % — AB (ref 11.5–15.5)
WBC: 5.6 10*3/uL (ref 4.0–10.5)

## 2015-11-28 LAB — BASIC METABOLIC PANEL WITH GFR
Anion gap: 12 (ref 5–15)
BUN: 20 mg/dL (ref 6–20)
CO2: 22 mmol/L (ref 22–32)
Calcium: 9.2 mg/dL (ref 8.9–10.3)
Chloride: 99 mmol/L — ABNORMAL LOW (ref 101–111)
Creatinine, Ser: 7.42 mg/dL — ABNORMAL HIGH (ref 0.44–1.00)
GFR calc Af Amer: 5 mL/min — ABNORMAL LOW
GFR calc non Af Amer: 5 mL/min — ABNORMAL LOW
Glucose, Bld: 66 mg/dL (ref 65–99)
Potassium: 4.8 mmol/L (ref 3.5–5.1)
Sodium: 133 mmol/L — ABNORMAL LOW (ref 135–145)

## 2015-11-28 MED ORDER — SODIUM CHLORIDE 0.9 % IV BOLUS (SEPSIS)
500.0000 mL | Freq: Once | INTRAVENOUS | Status: AC
  Administered 2015-11-28: 500 mL via INTRAVENOUS

## 2015-11-28 MED ORDER — METOCLOPRAMIDE HCL 10 MG PO TABS: 5.0000 mg | ORAL_TABLET | Freq: Four times a day (QID) | ORAL | Status: DC | PRN

## 2015-11-28 NOTE — ED Notes (Signed)
ptar at bedside. 

## 2015-11-28 NOTE — ED Notes (Signed)
[name] made aware that patient is being discharged from ED [facility].

## 2015-11-28 NOTE — ED Notes (Signed)
MD at bedside. 
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
## 2015-11-28 NOTE — ED Notes (Signed)
This RN attempted to contact [facility] to update them on patient's discharge and that she will be transported to dialysis from ED per Dr. Alden ServerJacubowitz's request.

## 2015-11-28 NOTE — ED Notes (Signed)
Gave Pt a [place] sandwich and orange juice per ok Dr. JShela Commons

## 2015-11-28 NOTE — Discharge Instructions (Signed)
Avoid milk or foods containing milk such as cheese or ice cream while having diarrhea. Take the medication prescribed as needed for nausea or vomiting. Take Imodium for diarrhea. Return or follow up with Dr. Chilton SiGreen if not feeling better in a few days. Go directly from here to your dialysis session

## 2015-11-28 NOTE — ED Notes (Addendum)
Pt arrives from [facility] for c/o n/v/d x 5 days. Pt is due for dialysis today. Pt received 4mg  zofran pta. Pt a/o x4.

## 2015-11-28 NOTE — ED Notes (Signed)
Notified [facility] for transportation to dialysis

## 2015-11-29 ENCOUNTER — Encounter: Payer: Medicare Other | Admitting: Vascular Surgery

## 2015-11-29 ENCOUNTER — Ambulatory Visit (HOSPITAL_COMMUNITY): Payer: Medicare Other | Attending: Vascular Surgery

## 2015-12-04 ENCOUNTER — Ambulatory Visit (HOSPITAL_COMMUNITY)
Admission: RE | Admit: 2015-12-04 | Discharge: 2015-12-04 | Disposition: A | Discharge status: Home / Self Care | Payer: Medicare Other | Source: Ambulatory Visit | Attending: Vascular Surgery | Admitting: Vascular Surgery

## 2015-12-04 DIAGNOSIS — I132 Hypertensive heart and chronic kidney disease with heart failure and with stage 5 chronic kidney disease, or end stage renal disease: Secondary | ICD-10-CM | POA: Diagnosis not present

## 2015-12-04 DIAGNOSIS — E1122 Type 2 diabetes mellitus with diabetic chronic kidney disease: Secondary | ICD-10-CM | POA: Insufficient documentation

## 2015-12-04 DIAGNOSIS — N186 End stage renal disease: Secondary | ICD-10-CM

## 2015-12-04 DIAGNOSIS — Z992 Dependence on renal dialysis: Secondary | ICD-10-CM | POA: Diagnosis not present

## 2015-12-04 DIAGNOSIS — D573 Sickle-cell trait: Secondary | ICD-10-CM | POA: Insufficient documentation

## 2015-12-04 DIAGNOSIS — I509 Heart failure, unspecified: Secondary | ICD-10-CM | POA: Insufficient documentation

## 2015-12-04 DIAGNOSIS — Z48812 Encounter for surgical aftercare following surgery on the circulatory system: Secondary | ICD-10-CM | POA: Diagnosis not present

## 2015-12-04 DIAGNOSIS — E7800 Pure hypercholesterolemia, unspecified: Secondary | ICD-10-CM | POA: Diagnosis not present

## 2015-12-06 ENCOUNTER — Telehealth: Payer: Self-pay

## 2015-12-06 NOTE — Telephone Encounter (Signed)
[facility] to [facility]  patient refused senna, Miralax, prostat-please advise. [facility], she has never been to our office. She has seen Dr. Elmore GuiseEd Green in the past, Dr. Murray HodgkinsArthur Green has never seen this patient. If you want our office to answer this fax  she needs appointment in the office.  Dagoberto Reefalled [name], the nurse will have to talk with her supervisor and call back. Wrote this info on fax and faxed back to [facility].

## 2015-12-07 ENCOUNTER — Encounter: Payer: Self-pay | Admitting: Vascular Surgery

## 2015-12-08 ENCOUNTER — Encounter (HOSPITAL_COMMUNITY): Payer: Self-pay

## 2015-12-08 ENCOUNTER — Emergency Department (HOSPITAL_COMMUNITY)
Admission: EM | Admit: 2015-12-08 | Discharge: 2015-12-08 | Disposition: A | Discharge status: Home / Self Care | Payer: Medicare Other | Source: Home / Self Care | Attending: Emergency Medicine | Admitting: Emergency Medicine

## 2015-12-08 ENCOUNTER — Encounter: Payer: Medicare Other | Admitting: Vascular Surgery

## 2015-12-08 DIAGNOSIS — Z794 Long term (current) use of insulin: Secondary | ICD-10-CM

## 2015-12-08 DIAGNOSIS — E11649 Type 2 diabetes mellitus with hypoglycemia without coma: Secondary | ICD-10-CM

## 2015-12-08 DIAGNOSIS — E1122 Type 2 diabetes mellitus with diabetic chronic kidney disease: Secondary | ICD-10-CM

## 2015-12-08 DIAGNOSIS — I132 Hypertensive heart and chronic kidney disease with heart failure and with stage 5 chronic kidney disease, or end stage renal disease: Secondary | ICD-10-CM | POA: Insufficient documentation

## 2015-12-08 DIAGNOSIS — Z87891 Personal history of nicotine dependence: Secondary | ICD-10-CM | POA: Insufficient documentation

## 2015-12-08 DIAGNOSIS — N186 End stage renal disease: Secondary | ICD-10-CM | POA: Insufficient documentation

## 2015-12-08 DIAGNOSIS — I5031 Acute diastolic (congestive) heart failure: Secondary | ICD-10-CM

## 2015-12-08 DIAGNOSIS — Z96651 Presence of right artificial knee joint: Secondary | ICD-10-CM | POA: Insufficient documentation

## 2015-12-08 DIAGNOSIS — K760 Fatty (change of) liver, not elsewhere classified: Secondary | ICD-10-CM | POA: Diagnosis not present

## 2015-12-08 DIAGNOSIS — R0602 Shortness of breath: Secondary | ICD-10-CM | POA: Diagnosis not present

## 2015-12-08 DIAGNOSIS — E162 Hypoglycemia, unspecified: Secondary | ICD-10-CM

## 2015-12-08 DIAGNOSIS — Z992 Dependence on renal dialysis: Secondary | ICD-10-CM | POA: Insufficient documentation

## 2015-12-08 LAB — COMPREHENSIVE METABOLIC PANEL
ALBUMIN: 2 g/dL — AB (ref 3.5–5.0)
ALK PHOS: 1247 U/L — AB (ref 38–126)
ALT: 322 U/L — ABNORMAL HIGH (ref 14–54)
ANION GAP: 11 (ref 5–15)
AST: 806 U/L — ABNORMAL HIGH (ref 15–41)
BILIRUBIN TOTAL: 10.2 mg/dL — AB (ref 0.3–1.2)
BUN: 9 mg/dL (ref 6–20)
CALCIUM: 9.3 mg/dL (ref 8.9–10.3)
CO2: 27 mmol/L (ref 22–32)
Chloride: 94 mmol/L — ABNORMAL LOW (ref 101–111)
Creatinine, Ser: 3.81 mg/dL — ABNORMAL HIGH (ref 0.44–1.00)
GFR calc non Af Amer: 10 mL/min — ABNORMAL LOW (ref >60)
GFR, EST AFRICAN AMERICAN: 12 mL/min — AB (ref >60)
Glucose, Bld: 69 mg/dL (ref 65–99)
Potassium: 4.4 mmol/L (ref 3.5–5.1)
Sodium: 132 mmol/L — ABNORMAL LOW (ref 135–145)
TOTAL PROTEIN: 6.9 g/dL (ref 6.5–8.1)

## 2015-12-08 LAB — I-STAT CHEM 8, ED
BUN: 10 mg/dL (ref 6–20)
CALCIUM ION: 1.1 mmol/L — AB (ref 1.12–1.23)
CHLORIDE: 94 mmol/L — AB (ref 101–111)
Creatinine, Ser: 3.7 mg/dL — ABNORMAL HIGH (ref 0.44–1.00)
GLUCOSE: 65 mg/dL (ref 65–99)
HCT: 42 % (ref 36.0–46.0)
Hemoglobin: 14.3 g/dL (ref 12.0–15.0)
Potassium: 4.5 mmol/L (ref 3.5–5.1)
SODIUM: 135 mmol/L (ref 135–145)
TCO2: 31 mmol/L (ref 0–100)

## 2015-12-08 LAB — CBG MONITORING, ED
GLUCOSE-CAPILLARY: 56 mg/dL — AB (ref 65–99)
GLUCOSE-CAPILLARY: 135 mg/dL — AB (ref 65–99)
GLUCOSE-CAPILLARY: 90 mg/dL (ref 65–99)
Glucose-Capillary: 171 mg/dL — ABNORMAL HIGH (ref 65–99)

## 2015-12-08 LAB — CBC
HEMATOCRIT: 37.2 % (ref 36.0–46.0)
Hemoglobin: 12 g/dL (ref 12.0–15.0)
MCH: 25.6 pg — ABNORMAL LOW (ref 26.0–34.0)
MCHC: 32.3 g/dL (ref 30.0–36.0)
MCV: 79.3 fL (ref 78.0–100.0)
PLATELETS: 154 10*3/uL (ref 150–400)
RBC: 4.69 MIL/uL (ref 3.87–5.11)
RDW: 18.2 % — AB (ref 11.5–15.5)
WBC: 8 10*3/uL (ref 4.0–10.5)

## 2015-12-08 MED ORDER — DEXTROSE 10 % IV BOLUS
100.0000 mL | Freq: Once | INTRAVENOUS | Status: AC
  Administered 2015-12-08: 100 mL via INTRAVENOUS

## 2015-12-08 NOTE — ED Notes (Signed)
Dietary notified, tray will be here at 0930.

## 2015-12-08 NOTE — Discharge Instructions (Signed)

## 2015-12-08 NOTE — ED Notes (Signed)
[facility] CALLED @ 1620.

## 2015-12-08 NOTE — ED Notes (Signed)
Pt eating rice krispie treat, waiting for tray.

## 2015-12-08 NOTE — ED Notes (Signed)
Pt presents with report of CBG of 35 this morning.  EMS reports similar episode last night with CBG 32, EMS was not called to nursing facility ([name] [address]).  Pt was symptomatic with slurred speech and R arm weakness.  HD on Tuesday, Thursday and Saturday.

## 2015-12-08 NOTE — ED Notes (Signed)
The patient CBG was 56, the Nurse was informed. Pt. Was given a cup of Orange Juice with sugar.

## 2015-12-08 NOTE — ED Notes (Signed)
Patient at baseline
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
## 2015-12-09 ENCOUNTER — Encounter (HOSPITAL_COMMUNITY): Payer: Self-pay | Admitting: *Deleted

## 2015-12-09 ENCOUNTER — Emergency Department (HOSPITAL_COMMUNITY): Payer: Medicare Other

## 2015-12-09 ENCOUNTER — Inpatient Hospital Stay (HOSPITAL_COMMUNITY)
Admission: EM | Admit: 2015-12-09 | Discharge: 2016-01-02 | DRG: 441 | Disposition: E | Payer: Medicare Other | Attending: Pulmonary Disease | Admitting: Pulmonary Disease

## 2015-12-09 DIAGNOSIS — Z87891 Personal history of nicotine dependence: Secondary | ICD-10-CM

## 2015-12-09 DIAGNOSIS — E872 Acidosis: Secondary | ICD-10-CM | POA: Diagnosis not present

## 2015-12-09 DIAGNOSIS — I10 Essential (primary) hypertension: Secondary | ICD-10-CM | POA: Diagnosis present

## 2015-12-09 DIAGNOSIS — Z794 Long term (current) use of insulin: Secondary | ICD-10-CM

## 2015-12-09 DIAGNOSIS — K760 Fatty (change of) liver, not elsewhere classified: Principal | ICD-10-CM | POA: Diagnosis present

## 2015-12-09 DIAGNOSIS — E1122 Type 2 diabetes mellitus with diabetic chronic kidney disease: Secondary | ICD-10-CM | POA: Diagnosis present

## 2015-12-09 DIAGNOSIS — R748 Abnormal levels of other serum enzymes: Secondary | ICD-10-CM | POA: Diagnosis present

## 2015-12-09 DIAGNOSIS — R7989 Other specified abnormal findings of blood chemistry: Secondary | ICD-10-CM | POA: Diagnosis not present

## 2015-12-09 DIAGNOSIS — S8010XA Contusion of unspecified lower leg, initial encounter: Secondary | ICD-10-CM | POA: Diagnosis present

## 2015-12-09 DIAGNOSIS — R4020 Unspecified coma: Secondary | ICD-10-CM | POA: Diagnosis not present

## 2015-12-09 DIAGNOSIS — Z8249 Family history of ischemic heart disease and other diseases of the circulatory system: Secondary | ICD-10-CM

## 2015-12-09 DIAGNOSIS — I5032 Chronic diastolic (congestive) heart failure: Secondary | ICD-10-CM | POA: Diagnosis present

## 2015-12-09 DIAGNOSIS — E1165 Type 2 diabetes mellitus with hyperglycemia: Secondary | ICD-10-CM

## 2015-12-09 DIAGNOSIS — I950 Idiopathic hypotension: Secondary | ICD-10-CM | POA: Diagnosis not present

## 2015-12-09 DIAGNOSIS — I951 Orthostatic hypotension: Secondary | ICD-10-CM | POA: Diagnosis not present

## 2015-12-09 DIAGNOSIS — R197 Diarrhea, unspecified: Secondary | ICD-10-CM

## 2015-12-09 DIAGNOSIS — IMO0001 Reserved for inherently not codable concepts without codable children: Secondary | ICD-10-CM | POA: Diagnosis present

## 2015-12-09 DIAGNOSIS — Z9049 Acquired absence of other specified parts of digestive tract: Secondary | ICD-10-CM

## 2015-12-09 DIAGNOSIS — G9340 Encephalopathy, unspecified: Secondary | ICD-10-CM | POA: Diagnosis not present

## 2015-12-09 DIAGNOSIS — R570 Cardiogenic shock: Secondary | ICD-10-CM | POA: Diagnosis not present

## 2015-12-09 DIAGNOSIS — Z96653 Presence of artificial knee joint, bilateral: Secondary | ICD-10-CM

## 2015-12-09 DIAGNOSIS — I9589 Other hypotension: Secondary | ICD-10-CM | POA: Diagnosis not present

## 2015-12-09 DIAGNOSIS — Z66 Do not resuscitate: Secondary | ICD-10-CM | POA: Diagnosis not present

## 2015-12-09 DIAGNOSIS — R0602 Shortness of breath: Secondary | ICD-10-CM | POA: Diagnosis present

## 2015-12-09 DIAGNOSIS — E1121 Type 2 diabetes mellitus with diabetic nephropathy: Secondary | ICD-10-CM | POA: Diagnosis present

## 2015-12-09 DIAGNOSIS — I132 Hypertensive heart and chronic kidney disease with heart failure and with stage 5 chronic kidney disease, or end stage renal disease: Secondary | ICD-10-CM | POA: Diagnosis present

## 2015-12-09 DIAGNOSIS — K831 Obstruction of bile duct: Secondary | ICD-10-CM | POA: Diagnosis present

## 2015-12-09 DIAGNOSIS — I959 Hypotension, unspecified: Secondary | ICD-10-CM

## 2015-12-09 DIAGNOSIS — K7200 Acute and subacute hepatic failure without coma: Secondary | ICD-10-CM | POA: Diagnosis present

## 2015-12-09 DIAGNOSIS — E11649 Type 2 diabetes mellitus with hypoglycemia without coma: Secondary | ICD-10-CM | POA: Diagnosis present

## 2015-12-09 DIAGNOSIS — E1022 Type 1 diabetes mellitus with diabetic chronic kidney disease: Secondary | ICD-10-CM | POA: Diagnosis not present

## 2015-12-09 DIAGNOSIS — G909 Disorder of the autonomic nervous system, unspecified: Secondary | ICD-10-CM | POA: Diagnosis not present

## 2015-12-09 DIAGNOSIS — E785 Hyperlipidemia, unspecified: Secondary | ICD-10-CM | POA: Diagnosis present

## 2015-12-09 DIAGNOSIS — N186 End stage renal disease: Secondary | ICD-10-CM | POA: Diagnosis present

## 2015-12-09 DIAGNOSIS — I469 Cardiac arrest, cause unspecified: Secondary | ICD-10-CM | POA: Insufficient documentation

## 2015-12-09 DIAGNOSIS — K469 Unspecified abdominal hernia without obstruction or gangrene: Secondary | ICD-10-CM | POA: Diagnosis present

## 2015-12-09 DIAGNOSIS — K838 Other specified diseases of biliary tract: Secondary | ICD-10-CM | POA: Diagnosis not present

## 2015-12-09 DIAGNOSIS — R531 Weakness: Secondary | ICD-10-CM

## 2015-12-09 DIAGNOSIS — D573 Sickle-cell trait: Secondary | ICD-10-CM | POA: Diagnosis present

## 2015-12-09 DIAGNOSIS — T148XXA Other injury of unspecified body region, initial encounter: Secondary | ICD-10-CM

## 2015-12-09 DIAGNOSIS — R945 Abnormal results of liver function studies: Secondary | ICD-10-CM | POA: Insufficient documentation

## 2015-12-09 DIAGNOSIS — R778 Other specified abnormalities of plasma proteins: Secondary | ICD-10-CM | POA: Diagnosis present

## 2015-12-09 DIAGNOSIS — I519 Heart disease, unspecified: Secondary | ICD-10-CM | POA: Diagnosis present

## 2015-12-09 DIAGNOSIS — J9601 Acute respiratory failure with hypoxia: Secondary | ICD-10-CM | POA: Diagnosis not present

## 2015-12-09 DIAGNOSIS — I472 Ventricular tachycardia: Secondary | ICD-10-CM | POA: Diagnosis present

## 2015-12-09 DIAGNOSIS — R0600 Dyspnea, unspecified: Secondary | ICD-10-CM | POA: Diagnosis not present

## 2015-12-09 DIAGNOSIS — Z992 Dependence on renal dialysis: Secondary | ICD-10-CM | POA: Diagnosis not present

## 2015-12-09 DIAGNOSIS — Z7982 Long term (current) use of aspirin: Secondary | ICD-10-CM

## 2015-12-09 DIAGNOSIS — E875 Hyperkalemia: Secondary | ICD-10-CM | POA: Diagnosis present

## 2015-12-09 DIAGNOSIS — S8012XA Contusion of left lower leg, initial encounter: Secondary | ICD-10-CM | POA: Diagnosis present

## 2015-12-09 DIAGNOSIS — M899 Disorder of bone, unspecified: Secondary | ICD-10-CM | POA: Diagnosis present

## 2015-12-09 DIAGNOSIS — R52 Pain, unspecified: Secondary | ICD-10-CM

## 2015-12-09 DIAGNOSIS — J9600 Acute respiratory failure, unspecified whether with hypoxia or hypercapnia: Secondary | ICD-10-CM | POA: Diagnosis not present

## 2015-12-09 DIAGNOSIS — Z993 Dependence on wheelchair: Secondary | ICD-10-CM

## 2015-12-09 DIAGNOSIS — Z6841 Body Mass Index (BMI) 40.0 and over, adult: Secondary | ICD-10-CM | POA: Diagnosis not present

## 2015-12-09 DIAGNOSIS — R627 Adult failure to thrive: Secondary | ICD-10-CM

## 2015-12-09 DIAGNOSIS — I509 Heart failure, unspecified: Secondary | ICD-10-CM | POA: Diagnosis not present

## 2015-12-09 DIAGNOSIS — R630 Anorexia: Secondary | ICD-10-CM

## 2015-12-09 DIAGNOSIS — I248 Other forms of acute ischemic heart disease: Secondary | ICD-10-CM | POA: Diagnosis present

## 2015-12-09 DIAGNOSIS — Z515 Encounter for palliative care: Secondary | ICD-10-CM | POA: Diagnosis not present

## 2015-12-09 DIAGNOSIS — R5381 Other malaise: Secondary | ICD-10-CM | POA: Diagnosis present

## 2015-12-09 DIAGNOSIS — I5189 Other ill-defined heart diseases: Secondary | ICD-10-CM | POA: Diagnosis present

## 2015-12-09 DIAGNOSIS — E876 Hypokalemia: Secondary | ICD-10-CM | POA: Diagnosis present

## 2015-12-09 HISTORY — DX: End stage renal disease: Z99.2

## 2015-12-09 HISTORY — DX: End stage renal disease: N18.6

## 2015-12-09 LAB — COMPREHENSIVE METABOLIC PANEL
ALBUMIN: 1.9 g/dL — AB (ref 3.5–5.0)
ALT: 320 U/L — ABNORMAL HIGH (ref 14–54)
AST: 835 U/L — AB (ref 15–41)
Alkaline Phosphatase: 1212 U/L — ABNORMAL HIGH (ref 38–126)
Anion gap: 15 (ref 5–15)
BUN: 17 mg/dL (ref 6–20)
CHLORIDE: 94 mmol/L — AB (ref 101–111)
CO2: 23 mmol/L (ref 22–32)
CREATININE: 5.31 mg/dL — AB (ref 0.44–1.00)
Calcium: 9.1 mg/dL (ref 8.9–10.3)
GFR calc Af Amer: 8 mL/min — ABNORMAL LOW (ref >60)
GFR, EST NON AFRICAN AMERICAN: 7 mL/min — AB (ref >60)
Glucose, Bld: 78 mg/dL (ref 65–99)
POTASSIUM: 5.5 mmol/L — AB (ref 3.5–5.1)
SODIUM: 132 mmol/L — AB (ref 135–145)
Total Bilirubin: 10.2 mg/dL — ABNORMAL HIGH (ref 0.3–1.2)
Total Protein: 6.9 g/dL (ref 6.5–8.1)

## 2015-12-09 LAB — CBG MONITORING, ED
GLUCOSE-CAPILLARY: 67 mg/dL (ref 65–99)
Glucose-Capillary: 107 mg/dL — ABNORMAL HIGH (ref 65–99)

## 2015-12-09 LAB — CBC
HCT: 36.8 % (ref 36.0–46.0)
Hemoglobin: 11.7 g/dL — ABNORMAL LOW (ref 12.0–15.0)
MCH: 24.8 pg — ABNORMAL LOW (ref 26.0–34.0)
MCHC: 31.8 g/dL (ref 30.0–36.0)
MCV: 78.1 fL (ref 78.0–100.0)
PLATELETS: 169 10*3/uL (ref 150–400)
RBC: 4.71 MIL/uL (ref 3.87–5.11)
RDW: 18.6 % — AB (ref 11.5–15.5)
WBC: 7.3 10*3/uL (ref 4.0–10.5)

## 2015-12-09 LAB — PROTIME-INR
INR: 1.37 (ref 0.00–1.49)
PROTHROMBIN TIME: 16.9 s — AB (ref 11.6–15.2)

## 2015-12-09 LAB — TROPONIN I
TROPONIN I: 0.22 ng/mL — AB (ref <0.03)
Troponin I: 0.21 ng/mL (ref <0.03)

## 2015-12-09 LAB — LIPASE, BLOOD: LIPASE: 141 U/L — AB (ref 11–51)

## 2015-12-09 LAB — CK: Total CK: 372 U/L — ABNORMAL HIGH (ref 38–234)

## 2015-12-09 LAB — GLUCOSE, CAPILLARY: GLUCOSE-CAPILLARY: 89 mg/dL (ref 65–99)

## 2015-12-09 LAB — ACETAMINOPHEN LEVEL

## 2015-12-09 LAB — AMMONIA: Ammonia: 72 umol/L — ABNORMAL HIGH (ref 9–35)

## 2015-12-09 MED ORDER — ONDANSETRON HCL 4 MG/2ML IJ SOLN
4.0000 mg | Freq: Four times a day (QID) | INTRAMUSCULAR | Status: DC | PRN
  Administered 2015-12-13 – 2015-12-17 (×6): 4 mg via INTRAVENOUS
  Filled 2015-12-09 (×6): qty 2

## 2015-12-09 MED ORDER — HEPARIN SODIUM (PORCINE) 5000 UNIT/ML IJ SOLN
5000.0000 [IU] | Freq: Three times a day (TID) | INTRAMUSCULAR | Status: DC
  Administered 2015-12-09 – 2015-12-16 (×21): 5000 [IU] via SUBCUTANEOUS
  Filled 2015-12-09 (×18): qty 1

## 2015-12-09 MED ORDER — PANTOPRAZOLE SODIUM 40 MG PO TBEC
40.0000 mg | DELAYED_RELEASE_TABLET | Freq: Every day | ORAL | Status: DC
  Administered 2015-12-10 – 2015-12-17 (×7): 40 mg via ORAL
  Filled 2015-12-09 (×7): qty 1

## 2015-12-09 MED ORDER — SODIUM CHLORIDE 0.9% FLUSH
3.0000 mL | Freq: Two times a day (BID) | INTRAVENOUS | Status: DC
  Administered 2015-12-09 – 2015-12-17 (×15): 3 mL via INTRAVENOUS

## 2015-12-09 MED ORDER — RENA-VITE PO TABS
1.0000 | ORAL_TABLET | Freq: Every day | ORAL | Status: DC
  Administered 2015-12-10 – 2015-12-15 (×6): 1 via ORAL
  Filled 2015-12-09 (×8): qty 1

## 2015-12-09 MED ORDER — SODIUM CHLORIDE 0.9 % IV SOLN: INTRAVENOUS | Status: DC

## 2015-12-09 MED ORDER — INSULIN ASPART 100 UNIT/ML ~~LOC~~ SOLN
0.0000 [IU] | SUBCUTANEOUS | Status: DC
  Administered 2015-12-10: 1 [IU] via SUBCUTANEOUS
  Administered 2015-12-10: 2 [IU] via SUBCUTANEOUS
  Administered 2015-12-12 (×3): 1 [IU] via SUBCUTANEOUS
  Administered 2015-12-12 – 2015-12-13 (×2): 2 [IU] via SUBCUTANEOUS
  Administered 2015-12-13: 1 [IU] via SUBCUTANEOUS
  Administered 2015-12-13 – 2015-12-14 (×2): 2 [IU] via SUBCUTANEOUS
  Administered 2015-12-15: 1 [IU] via SUBCUTANEOUS
  Administered 2015-12-16 (×3): 0 [IU] via SUBCUTANEOUS
  Administered 2015-12-17: 1 [IU] via SUBCUTANEOUS
  Administered 2015-12-17: 2 [IU] via SUBCUTANEOUS
  Administered 2015-12-17 (×2): 0 [IU] via SUBCUTANEOUS
  Administered 2015-12-17: 2 [IU] via SUBCUTANEOUS
  Administered 2015-12-17 – 2015-12-18 (×2): 0 [IU] via SUBCUTANEOUS

## 2015-12-09 MED ORDER — ONDANSETRON HCL 4 MG PO TABS: 4.0000 mg | ORAL_TABLET | Freq: Four times a day (QID) | ORAL | Status: DC | PRN

## 2015-12-09 MED ORDER — CALCIUM ACETATE (PHOS BINDER) 667 MG PO CAPS
667.0000 mg | ORAL_CAPSULE | Freq: Three times a day (TID) | ORAL | Status: DC
  Administered 2015-12-10 – 2015-12-13 (×8): 667 mg via ORAL
  Filled 2015-12-09 (×9): qty 1

## 2015-12-09 MED ORDER — MIDODRINE HCL 5 MG PO TABS
10.0000 mg | ORAL_TABLET | ORAL | Status: DC
  Administered 2015-12-12 – 2015-12-14 (×2): 10 mg via ORAL

## 2015-12-09 NOTE — ED Notes (Signed)
Pt has no complaints at this time other than weakness that has been ongoing.

## 2015-12-09 NOTE — ED Notes (Signed)
[facility]. Staff sent out pt rt loose stools x10 days. Pt was also evaluated here yesterday for a low cbg of 33. Pt receives dialysis Tu, Thurs, Sat and had her last dialysis Thurs. Pt was due for dialysis today at 1000.

## 2015-12-09 NOTE — Procedures (Signed)
   HEMODIALYSIS TREATMENT NOTE:  3 hour low heparin dialysis completed via right IJ tunneled catheter. Exit site unremarkable.  NO UF ordered.  Pt hypotensive throughout session with SBP 90s.  NS bolus given for symptomatic hypotension. Saline flushes necessary for circuit patency; Pt unable to tolerate even minimal UF to balance these boluses.  UF +528cc by end of treatment. All blood was returned.  Pt's only complaint is hunger. Report called to Inetta Fermoina, Charity fundraiserN.  Arman FilterAngela Severina Sykora, RN, CDN

## 2015-12-09 NOTE — Procedures (Signed)
  I was present at this dialysis session, have reviewed the session itself and made  appropriate changes Vinson Moselleob Hally Colella MD [facility] pager [phone]    cell [phone] 12/24/2015, 5:48 PM

## 2015-12-09 NOTE — ED Notes (Signed)
Attempted report to 6E 
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
## 2015-12-09 NOTE — ED Notes (Signed)
Dr. Denton LankSteinl notified of elevated troponin.
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
## 2015-12-09 NOTE — ED Notes (Signed)
Admitting at bedside and aware of hypotension.
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
## 2015-12-09 NOTE — Progress Notes (Signed)
Dr. Matthias HughsBuccini has reviewed recent abdominal ultrasound. States patient does not have an obstructive cause to her hyperbilirubinemia and transaminitis and is concerned of hepatocellular etiology such as infection or possibly medications. There is an outside chance she may have hypoperfusion related to low blood pressure in a patient with known autonomic dysfunction and midodrine. He is asked that we discontinue Lexapro for now. Patient did have order for Tylenol 650 mg every 6 hours prn but was in a controlled environment in a skilled nursing facility so it is doubtful she took too many doses of this medication. Check Tylenol level as precaution  Junious SilkAllison Ellis, ANP

## 2015-12-10 LAB — CBC
HEMATOCRIT: 31.9 % — AB (ref 36.0–46.0)
Hemoglobin: 10.1 g/dL — ABNORMAL LOW (ref 12.0–15.0)
MCH: 24.9 pg — ABNORMAL LOW (ref 26.0–34.0)
MCHC: 31.7 g/dL (ref 30.0–36.0)
MCV: 78.8 fL (ref 78.0–100.0)
PLATELETS: 152 10*3/uL (ref 150–400)
RBC: 4.05 MIL/uL (ref 3.87–5.11)
RDW: 19.1 % — AB (ref 11.5–15.5)
WBC: 8 10*3/uL (ref 4.0–10.5)

## 2015-12-10 LAB — TROPONIN I
TROPONIN I: 0.2 ng/mL — AB (ref <0.03)
Troponin I: 0.26 ng/mL (ref <0.03)

## 2015-12-10 LAB — PROTIME-INR
INR: 1.6 — AB (ref 0.00–1.49)
PROTHROMBIN TIME: 19.1 s — AB (ref 11.6–15.2)

## 2015-12-10 LAB — MRSA PCR SCREENING: MRSA BY PCR: NEGATIVE

## 2015-12-10 LAB — GLUCOSE, CAPILLARY
GLUCOSE-CAPILLARY: 114 mg/dL — AB (ref 65–99)
GLUCOSE-CAPILLARY: 119 mg/dL — AB (ref 65–99)
GLUCOSE-CAPILLARY: 134 mg/dL — AB (ref 65–99)
GLUCOSE-CAPILLARY: 177 mg/dL — AB (ref 65–99)
Glucose-Capillary: 107 mg/dL — ABNORMAL HIGH (ref 65–99)
Glucose-Capillary: 64 mg/dL — ABNORMAL LOW (ref 65–99)
Glucose-Capillary: 82 mg/dL (ref 65–99)
Glucose-Capillary: 86 mg/dL (ref 65–99)

## 2015-12-10 LAB — HEPATITIS PANEL, ACUTE
HCV Ab: <0.1 s/co ratio (ref 0.0–0.9)
HEP B C IGM: NEGATIVE
HEP B S AG: NEGATIVE
Hep A IgM: NEGATIVE

## 2015-12-10 LAB — COMPREHENSIVE METABOLIC PANEL
ALBUMIN: 1.7 g/dL — AB (ref 3.5–5.0)
ALT: 290 U/L — ABNORMAL HIGH (ref 14–54)
AST: 772 U/L — AB (ref 15–41)
Alkaline Phosphatase: 1058 U/L — ABNORMAL HIGH (ref 38–126)
Anion gap: 11 (ref 5–15)
BILIRUBIN TOTAL: 8.3 mg/dL — AB (ref 0.3–1.2)
BUN: 7 mg/dL (ref 6–20)
CALCIUM: 8 mg/dL — AB (ref 8.9–10.3)
CHLORIDE: 96 mmol/L — AB (ref 101–111)
CO2: 24 mmol/L (ref 22–32)
CREATININE: 2.88 mg/dL — AB (ref 0.44–1.00)
GFR calc Af Amer: 17 mL/min — ABNORMAL LOW (ref >60)
GFR, EST NON AFRICAN AMERICAN: 15 mL/min — AB (ref >60)
Glucose, Bld: 73 mg/dL (ref 65–99)
Potassium: 3.8 mmol/L (ref 3.5–5.1)
Sodium: 131 mmol/L — ABNORMAL LOW (ref 135–145)
TOTAL PROTEIN: 6 g/dL — AB (ref 6.5–8.1)

## 2015-12-10 LAB — APTT: aPTT: 102 seconds — ABNORMAL HIGH (ref 24–37)

## 2015-12-10 NOTE — Progress Notes (Signed)
  [facility] Progress Note   Subjective: no new c/o, taking solid food, no n/v/d  Filed Vitals:   Dec 26, 2015 2030 12/10/15 0438 12/10/15 1045 12/10/15 1726  BP: 94/42 105/36 101/48 96/70  Pulse: 91 72 70 76  Temp: 98.4 F (36.9 C) 98.5 F (36.9 C) 98.4 F (36.9 C) 98 F (36.7 C)  TempSrc: Oral Oral Oral Oral  Resp: 16 17 18 18   Weight: 107.8 kg (237 lb 10.5 oz)     SpO2: 100% 96% 96% 98%    Inpatient medications: . calcium acetate  667 mg Oral TID WC  . heparin  5,000 Units Subcutaneous Q8H  . insulin aspart  0-9 Units Subcutaneous Q4H  . [START ON 12/12/2015] midodrine  10 mg Oral Q T,Th,Sa-HD  . multivitamin  1 tablet Oral Daily  . pantoprazole  40 mg Oral Daily  . sodium chloride flush  3 mL Intravenous Q12H     ondansetron **OR** ondansetron (ZOFRAN) IV  Exam: Gen alert, no distress No jvd or bruits Chest clear bilat RRR no MRG Abd soft ntnd obese, +active BS, no HSM LUA AVF +bruit, immature R IJ cath in place, no drainage Ext no LE edema Neuro is alert, Ox 3 , nf    Dialysis: TTS NW 4h 108kg 2/2.25 bath Hep 6000 R IJ cath (maturing L AVF) Venof 50/thurs, hect 2 ug Last hb 12.5  Assessment: 1. Jaundice/ ^LFT's - no biliary obstruction on US. GI evaluating 2. ESRD HD tts 3. Vol is at dry wt 4. Chron hypotension on midodrine 5. IDDM, on insulin 6. Debility - WC bound at present 7. Obesity  Plan - cont HD TTS   Vinson Moselleob Fatimata Talsma MD [facility] pager [phone]    cell [phone] 12/10/2015, 5:35 PM    Recent Labs Lab 12/08/15 0845 12/08/15 0903 Dec 26, 2015 1111 12/10/15 0258  NA 132* 135 132* 131*  K 4.4 4.5 5.5* 3.8  CL 94* 94* 94* 96*  CO2 27  --  23 24  GLUCOSE 69 65 78 73  BUN 9 10 17 7   CREATININE 3.81* 3.70* 5.31* 2.88*  CALCIUM 9.3  --  9.1 8.0*    Recent Labs Lab 12/08/15 0845 Dec 26, 2015 1111 12/10/15 0258  AST 806* 835* 772*  ALT 322* 320* 290*  ALKPHOS 1247* 1212* 1058*  BILITOT 10.2*  10.2* 8.3*  PROT 6.9 6.9 6.0*  ALBUMIN 2.0* 1.9* 1.7*    Recent Labs Lab 12/08/15 0845 12/08/15 0903 Dec 26, 2015 1111 12/10/15 0258  WBC 8.0  --  7.3 8.0  HGB 12.0 14.3 11.7* 10.1*  HCT 37.2 42.0 36.8 31.9*  MCV 79.3  --  78.1 78.8  PLT 154  --  169 152   Iron/TIBC/Ferritin/ %Sat    Component Value Date/Time   IRON 12* 09/28/2015 0450   TIBC 223* 09/28/2015 0450   IRONPCTSAT 5* 09/28/2015 0450

## 2015-12-10 NOTE — Progress Notes (Signed)
Addendum to previous note:  Pt states her diarr has resolved.  May be able to d/c contact isolation and cx C diff order if she remains diarrhea-free.  Florencia Reasonsobert V. Mallerie Blok, M.D. Pager [phone] If no answer or after 5 PM call [phone]

## 2015-12-10 NOTE — Progress Notes (Signed)
PROBLEM:  New onset elev of LFT's.  LFT's trending downward moderately.  INR slt higher, possibly due to heparin on dialysis yesterday.  Acetaminophen level undetectable.  IMPR:  Resolving elevation of liver chemistries--??resolving "shock liver," ?reactive hepatopathy, ??Lexapro side effect  PLAN:  Continue monitoring for at least one more day, to be sure that this is truly a trend toward improvement, and not just some lab variability.  Florencia Reasonsobert V. Darcee Dekker, M.D. Pager [phone] If no answer or after 5 PM call [phone]

## 2015-12-10 NOTE — Progress Notes (Addendum)
Patient ID: Myrtice LauthClara P Leamer, female   DOB: 28-Oct-1933, 82 y.o.   MRN: [id number]   PROGRESS NOTE    Myrtice LauthClara P Declercq  WUJ:[id number] DOB: 28-Oct-1933 DOA: 01/01/2016  PCP: Enrique SackGREEN, EDWIN JAY, MD   Brief Narrative:  82 y.o. Female with ESRD on HD, DM type II, [facility] and in process of moving to ALF, [facility] 1-2 weeks duration of diarrhea and had blood work notable for elevated liver enzymes, mixed cholestatic and hepatocellular pattern. Bilirubin is 10, alkaline phosphatase 1200, transaminases in the 300-800 range. Platelets are normal at 169,000, and INR is 1.37. For comparison, 2 months ago the patient's liver chemistries were completely normal and her INR at that time was 1.03.  An abdominal ultrasound notable for significant hepatic steatosis, status post cholecystectomy, 8 mm common bile duct consistent with prior cholecystectomy state but no intrahepatic biliary ductal dilatation, no space-occupying lesions of the liver.  Assessment & Plan:   Principal Problem: New-onset elevation of liver chemistries with jaundice - mixed hepatocellular/cholestatic pattern, without evidence of anatomic biliary obstruction or focal hepatic parenchymal lesions - LFT's slightly better this AM - repeat LFT's in AM - appreciate GI team following   Active Problems:   HTN (hypertension), essential - reasonably stable this AM    Morbid obesity (HCC) - Body mass index is 40.77 kg/(m^2).     ESRD on dialysis [facility](HCC) - per nephrology, appreciate assistance     Diarrhea - C. Diff pending  - stool panel pending     Left calf hematoma - after trauma to the area - pt says it is less painful - keep k-pad in place    Diabetes mellitus, insulin dependent (IDDM), with complications of nephropathy  - reasonable inpatient control  - keep on SSI for now    Acute hyperkalemia - resolved     Elevated troponin - no chest pain this AM - overall flat trend - would not  check further unless pt with chest pain     Physical deconditioning - PT eval pending     Left ventricular diastolic dysfunction, NYHA class 1 - monitor weights   DVT prophylaxis: Heparin SQ Code Status: Full  Family Communication: Patient at bedside  Disposition Plan: ALF in 1-2 days  Consultants:   GI  Nephrology   Procedures:   None  Antimicrobials:   None   Subjective: No events overnight.   Objective: Filed Vitals:   May 13, 2016 1950 May 13, 2016 2005 May 13, 2016 2030 12/10/15 0438  BP: 93/48 111/46 94/42 105/36  Pulse: 87 87 91 72  Temp:  97.7 F (36.5 C) 98.4 F (36.9 C) 98.5 F (36.9 C)  TempSrc:  Oral Oral Oral  Resp:  16 16 17   Weight:   107.8 kg (237 lb 10.5 oz)   SpO2:  98% 100% 96%    Intake/Output Summary (Last 24 hours) at 12/10/15 0734 Last data filed at 12/10/15 0500  Gross per 24 hour  Intake    480 ml  Output   -528 ml  Net   1008 ml   Filed Weights   May 13, 2016 2030  Weight: 107.8 kg (237 lb 10.5 oz)    Examination:  General exam: Appears calm and comfortable  Respiratory system: Clear to auscultation. Respiratory effort normal. Cardiovascular system: S1 & S2 heard, RRR. No rubs, gallops or clicks. No pedal edema. Gastrointestinal system: Abdomen is nondistended, soft and nontender. No organomegaly or masses felt.  Central nervous system: Alert and oriented. No  focal neurological deficits. Extremities: Symmetric 5 x 5 power. Left anterior shin area hematoma, slightly TTP Skin: No rashes, lesions or ulcers Psychiatry: Judgement and insight appear normal. Mood & affect appropriate.    Data Reviewed: I have personally reviewed following labs and imaging studies  CBC:  Recent Labs Lab 12/08/15 0845 12/08/15 0903 12-11-2015 1111 12/10/15 0258  WBC 8.0  --  7.3 8.0  HGB 12.0 14.3 11.7* 10.1*  HCT 37.2 42.0 36.8 31.9*  MCV 79.3  --  78.1 78.8  PLT 154  --  169 152   Basic Metabolic Panel:  Recent Labs Lab 12/08/15 0845  12/08/15 0903 12-11-2015 1111 12/10/15 0258  NA 132* 135 132* 131*  K 4.4 4.5 5.5* 3.8  CL 94* 94* 94* 96*  CO2 27  --  23 24  GLUCOSE 69 65 78 73  BUN 9 10 17 7   CREATININE 3.81* 3.70* 5.31* 2.88*  CALCIUM 9.3  --  9.1 8.0*   Liver Function Tests:  Recent Labs Lab 12/08/15 0845 11-Dec-2015 1111 12/10/15 0258  AST 806* 835* 772*  ALT 322* 320* 290*  ALKPHOS 1247* 1212* 1058*  BILITOT 10.2* 10.2* 8.3*  PROT 6.9 6.9 6.0*  ALBUMIN 2.0* 1.9* 1.7*    Recent Labs Lab 12-11-2015 1111  LIPASE 141*    Recent Labs Lab 11-Dec-2015 1111  AMMONIA 72*   Coagulation Profile:  Recent Labs Lab 2015-12-11 1111 12/10/15 0258  INR 1.37 1.60*   Cardiac Enzymes:  Recent Labs Lab Dec 11, 2015 1111 12/11/2015 2042 12/10/15 0258  CKTOTAL 372*  --   --   TROPONINI 0.22* 0.21* 0.20*   CBG:  Recent Labs Lab 2015/12/11 1357 12-11-2015 2058 12/10/15 0005 12/10/15 0429 12/10/15 0501  GLUCAP 107* 89 82 64* 86   Urine analysis:    Component Value Date/Time   COLORURINE AMBER* 10/05/2015 1229   APPEARANCEUR TURBID* 10/05/2015 1229   LABSPEC 1.020 10/05/2015 1229   PHURINE 5.0 10/05/2015 1229   GLUCOSEU NEGATIVE 10/05/2015 1229   HGBUR LARGE* 10/05/2015 1229   BILIRUBINUR LARGE* 10/05/2015 1229   KETONESUR 15* 10/05/2015 1229   PROTEINUR >300* 10/05/2015 1229   UROBILINOGEN 1.0 02/11/2014 0913   NITRITE NEGATIVE 10/05/2015 1229   LEUKOCYTESUR LARGE* 10/05/2015 1229   Recent Results (from the past 240 hour(s))  Culture, blood (Routine X 2) w Reflex to ID Panel     Status: None (Preliminary result)   Collection Time: 2015/12/11  1:35 PM  Result Value Ref Range Status   Specimen Description BLOOD RIGHT HAND  Final   Special Requests IN PEDIATRIC BOTTLE  4CC  Final   Culture PENDING  Incomplete   Report Status PENDING  Incomplete  MRSA PCR Screening     Status: None   Collection Time: December 11, 2015 10:24 PM  Result Value Ref Range Status   MRSA by PCR NEGATIVE NEGATIVE Final    Comment:         The GeneXpert MRSA Assay (FDA approved for NASAL specimens only), is one component of a comprehensive MRSA colonization surveillance program. It is not intended to diagnose MRSA infection nor to guide or monitor treatment for MRSA infections.       Radiology Studies: US Abdomen Complete  12/11/2015  CLINICAL DATA:  Elevated liver enzymes EXAM: ABDOMEN ULTRASOUND COMPLETE COMPARISON:  CT abdomen and pelvis Oct 04, 2015 FINDINGS: Gallbladder: Surgically absent. Common bile duct: Diameter: 8 mm. No mass or calculus is visualized in the biliary ductal system. Liver: No focal lesion identified. Liver echogenicity is  overall increased and mildly inhomogeneous. IVC: No abnormality visualized. Pancreas: Visualized portion unremarkable. Portions of the pancreas are obscured by gas. Spleen: Size and appearance within normal limits. Right Kidney: Length: 10.1 cm. Echogenicity within normal limits. No mass or hydronephrosis visualized. Left Kidney: Left kidney is markedly atrophic and calcified based on CT. Overlying gas in this area obscures the atrophic left kidney. Atrophic left kidney not appreciable by ultrasound. Abdominal aorta: No aneurysm visualized. Note that portions of the aorta are not well seen due to overlying bowel gas. Other findings: No demonstrable ascites. IMPRESSION: Gallbladder absent. Common bile duct measures 8 mm which is within normal limits for post cholecystectomy state. No calculus or mass is seen in the biliary ductal system. Note that there is gas obscuring portions of the common bile duct. Liver echogenicity is mildly inhomogeneous and overall increased. This finding most likely is indicative of hepatic steatosis. While no focal liver lesions are identified, it must be cautioned that the sensitivity of ultrasound for focal liver lesions is diminished in this circumstance. Left kidney is known to be atrophic and calcified. It is not appreciable on this study. There is extensive  gas in the bowel in the left abdomen which obscures the atrophic left kidney. Study otherwise unremarkable. Electronically Signed   By: Bretta Bang III M.D.   On: 12/28/2015 16:02   Dg Chest Port 1 View  12/22/2015  CLINICAL DATA:  Weakness and shortness breath EXAM: PORTABLE CHEST 1 VIEW COMPARISON:  11/28/2015 chest radiograph. FINDINGS: Right internal jugular central venous catheter terminates in the lower third of the superior vena cava. Stable cardiomediastinal silhouette with mild cardiomegaly and small hiatal hernia. No pneumothorax. Trace bilateral pleural effusions. Cephalization of the pulmonary vasculature without overt pulmonary edema. Curvilinear opacities at both lung bases, favor mild atelectasis. IMPRESSION: 1. Stable mild cardiomegaly.  No overt pulmonary edema. 2. Trace bilateral pleural effusions and mild bibasilar curvilinear opacities, favor atelectasis. Electronically Signed   By: Delbert Phenix M.D.   On: 12/06/2015 11:23      Scheduled Meds: . calcium acetate  667 mg Oral TID WC  . heparin  5,000 Units Subcutaneous Q8H  . insulin aspart  0-9 Units Subcutaneous Q4H  . [START ON 12/12/2015] midodrine  10 mg Oral Q T,Th,Sa-HD  . multivitamin  1 tablet Oral Daily  . pantoprazole  40 mg Oral Daily  . sodium chloride flush  3 mL Intravenous Q12H   Continuous Infusions:    LOS: 1 day    Time spent: 20 minutes    Debbora Presto, MD [facility] Pager [phone]  If 7PM-7AM, please contact night-coverage [email or web address] Password [facility] 12/10/2015, 7:34 AM

## 2015-12-10 NOTE — Progress Notes (Signed)
Patient cardiac monitoring: 11:21 wide QRS 9 beats, 13:41 SVT, 14:24 SVT. Strips saved in Epic.  Dr. Izola PriceMyers notified via text page.

## 2015-12-10 NOTE — Progress Notes (Signed)
New Admission Note: 12/05/2015  Arrival Method: Bed Mental Orientation:alert and oriented x 4 Telemetry: 6E26 NSR, 2nd verification completed Assessment: completed Skin: 2 RNs checked. No issues  IV: RFA NSL Pain: See pain assessment Safety Measures: Bed in low position, bed alarm on. Call light within reach. Admission: Completed [address] [place] Orientation: Patient has been orientated to the room, unit and staff.  Family: none at bedside upon arrival to 6E26 from hemodialysis  Orders have been reviewed and implemented. Will continue to monitor the patient.

## 2015-12-11 ENCOUNTER — Inpatient Hospital Stay (HOSPITAL_COMMUNITY): Payer: Medicare Other

## 2015-12-11 DIAGNOSIS — R197 Diarrhea, unspecified: Secondary | ICD-10-CM

## 2015-12-11 DIAGNOSIS — I959 Hypotension, unspecified: Secondary | ICD-10-CM

## 2015-12-11 DIAGNOSIS — R0600 Dyspnea, unspecified: Secondary | ICD-10-CM

## 2015-12-11 LAB — COMPREHENSIVE METABOLIC PANEL
ALK PHOS: 1062 U/L — AB (ref 38–126)
ALT: 286 U/L — AB (ref 14–54)
AST: 789 U/L — AB (ref 15–41)
Albumin: 1.6 g/dL — ABNORMAL LOW (ref 3.5–5.0)
Anion gap: 9 (ref 5–15)
BILIRUBIN TOTAL: 9.2 mg/dL — AB (ref 0.3–1.2)
BUN: 16 mg/dL (ref 6–20)
CALCIUM: 9 mg/dL (ref 8.9–10.3)
CHLORIDE: 96 mmol/L — AB (ref 101–111)
CO2: 28 mmol/L (ref 22–32)
CREATININE: 4.78 mg/dL — AB (ref 0.44–1.00)
GFR, EST AFRICAN AMERICAN: 9 mL/min — AB (ref >60)
GFR, EST NON AFRICAN AMERICAN: 8 mL/min — AB (ref >60)
Glucose, Bld: 77 mg/dL (ref 65–99)
Potassium: 3.8 mmol/L (ref 3.5–5.1)
Sodium: 133 mmol/L — ABNORMAL LOW (ref 135–145)
Total Protein: 5.9 g/dL — ABNORMAL LOW (ref 6.5–8.1)

## 2015-12-11 LAB — GLUCOSE, CAPILLARY
GLUCOSE-CAPILLARY: 83 mg/dL (ref 65–99)
GLUCOSE-CAPILLARY: 112 mg/dL — AB (ref 65–99)
Glucose-Capillary: 78 mg/dL (ref 65–99)
Glucose-Capillary: 120 mg/dL — ABNORMAL HIGH (ref 65–99)
Glucose-Capillary: 127 mg/dL — ABNORMAL HIGH (ref 65–99)
Glucose-Capillary: 118 mg/dL — ABNORMAL HIGH (ref 65–99)

## 2015-12-11 LAB — BLOOD GAS, ARTERIAL
Acid-Base Excess: 4.1 mmol/L — ABNORMAL HIGH (ref 0.0–2.0)
BICARBONATE: 27.2 meq/L — AB (ref 20.0–24.0)
DRAWN BY: 246861
O2 Content: 2 L/min
O2 Saturation: 94.7 %
PATIENT TEMPERATURE: 98.6
PCO2 ART: 34.5 mmHg — AB (ref 35.0–45.0)
PH ART: 7.508 — AB (ref 7.350–7.450)
TCO2: 28.2 mmol/L (ref 0–100)
pO2, Arterial: 88.5 mmHg (ref 80.0–100.0)

## 2015-12-11 LAB — CBC
HCT: 30.8 % — ABNORMAL LOW (ref 36.0–46.0)
Hemoglobin: 9.7 g/dL — ABNORMAL LOW (ref 12.0–15.0)
MCH: 25.1 pg — ABNORMAL LOW (ref 26.0–34.0)
MCHC: 31.5 g/dL (ref 30.0–36.0)
MCV: 79.6 fL (ref 78.0–100.0)
PLATELETS: 152 10*3/uL (ref 150–400)
RBC: 3.87 MIL/uL (ref 3.87–5.11)
RDW: 18.8 % — AB (ref 11.5–15.5)
WBC: 7.3 10*3/uL (ref 4.0–10.5)

## 2015-12-11 LAB — C DIFFICILE QUICK SCREEN W PCR REFLEX
C DIFFICILE (CDIFF) INTERP: NOT DETECTED
C DIFFICILE (CDIFF) TOXIN: NEGATIVE
C DIFFICLE (CDIFF) ANTIGEN: NEGATIVE

## 2015-12-11 LAB — PROTIME-INR
INR: 1.47 (ref 0.00–1.49)
Prothrombin Time: 17.9 seconds — ABNORMAL HIGH (ref 11.6–15.2)

## 2015-12-11 LAB — TROPONIN I: TROPONIN I: 0.28 ng/mL — AB (ref <0.03)

## 2015-12-11 MED ORDER — NEPRO/CARBSTEADY PO LIQD
237.0000 mL | Freq: Two times a day (BID) | ORAL | Status: DC
  Administered 2015-12-14: 237 mL via ORAL
  Filled 2015-12-11 (×4): qty 237

## 2015-12-11 MED ORDER — SODIUM CHLORIDE 0.9 % IV BOLUS (SEPSIS)
500.0000 mL | Freq: Once | INTRAVENOUS | Status: AC
  Administered 2015-12-11: 500 mL via INTRAVENOUS

## 2015-12-11 MED ORDER — DARBEPOETIN ALFA 60 MCG/0.3ML IJ SOSY
60.0000 ug | PREFILLED_SYRINGE | INTRAMUSCULAR | Status: DC
  Administered 2015-12-12: 60 ug via INTRAVENOUS
  Filled 2015-12-11: qty 0.3

## 2015-12-11 MED ORDER — DOXERCALCIFEROL 4 MCG/2ML IV SOLN
2.0000 ug | INTRAVENOUS | Status: DC
  Administered 2015-12-12: 2 ug via INTRAVENOUS
  Filled 2015-12-11: qty 2

## 2015-12-11 NOTE — Progress Notes (Signed)
Patient ID: Katherine [name], female   DOB: 1933-11-26, 82 y.o.   MRN: [id number]   PROGRESS NOTE    ANAGHA LOSEKE  WUJ:[id number] DOB: Oct 20, 1933 DOA: 01-07-16  PCP: [name], MD   Brief Narrative:  82 y.o. Female with ESRD on HD, DM type II, resident of [facility] and in process of moving to ALF, presented to [facility] for evaluation of 1-2 weeks duration of diarrhea and had blood work notable for elevated liver enzymes, mixed cholestatic and hepatocellular pattern. Bilirubin is 10, alkaline phosphatase 1200, transaminases in the 300-800 range. Platelets are normal at 169,000, and INR is 1.37. For comparison, 2 months ago the patient's liver chemistries were completely normal and her INR at that time was 1.03.  An abdominal ultrasound notable for significant hepatic steatosis, status post cholecystectomy, 8 mm common bile duct consistent with prior cholecystectomy state but no intrahepatic biliary ductal dilatation, no space-occupying lesions of the liver.  Assessment & Plan:   Principal Problem: New-onset elevation of liver chemistries with jaundice - mixed hepatocellular/cholestatic pattern, without evidence of anatomic biliary obstruction or focal hepatic parenchymal lesions - LFT's better since admission but unchanged in the past 24 hours  - repeat LFT's in AM - appreciate GI team following  - continue with conservative management for now   Active Problems:   HTN (hypertension), essential - reasonably stable this AM    Morbid obesity (HCC) - Body mass index is 40.55 kg/(m^2).     ESRD on dialysis [facility]) - per nephrology, appreciate assistance     Diarrhea - resolved, contact precautions d/c    Left calf hematoma - after trauma to the area - looks better this AM, less TTP  - keep k-pad in place    Diabetes mellitus, insulin dependent (IDDM), with complications of nephropathy  - reasonable inpatient control  - keep on SSI for now    Acute hyperkalemia - resolved     Elevated troponin - no chest pain this AM - overall flat trend - would not check further unless pt with chest pain     Physical deconditioning - PT eval pending     Left ventricular diastolic dysfunction, NYHA class 1 - monitor weights   DVT prophylaxis: Heparin SQ Code Status: Full  Family Communication: Patient at bedside  Disposition Plan: ALF in 1-2 days if LFT improve   Consultants:   GI  Nephrology   Procedures:   None  Antimicrobials:   None   Subjective: No events overnight.   Objective: Filed Vitals:   12/10/15 1726 12/10/15 2023 12/11/15 0445 12/11/15 0747  BP: 96/70 80/48 98/38  93/37  Pulse: 76 86 82 86  Temp: 98 F (36.7 C) 98.7 F (37.1 C) 98.4 F (36.9 C) 98.5 F (36.9 C)  TempSrc: Oral Oral Oral Oral  Resp: 18 18 16 18   Weight:  107.2 kg (236 lb 5.3 oz)    SpO2: 98% 100% 99% 100%    Intake/Output Summary (Last 24 hours) at 12/11/15 1117 Last data filed at 12/11/15 1002  Gross per 24 hour  Intake    360 ml  Output      0 ml  Net    360 ml   Filed Weights   01/07/2016 2030 12/10/15 2023  Weight: 107.8 kg (237 lb 10.5 oz) 107.2 kg (236 lb 5.3 oz)    Examination:  General exam: Appears calm and comfortable  Respiratory system: Clear to auscultation. Respiratory effort normal. Cardiovascular system: S1 & S2 heard, RRR.  No rubs, gallops or clicks. No pedal edema. Gastrointestinal system: Abdomen is nondistended, soft and nontender. No organomegaly or masses felt.  Central nervous system: Alert and oriented. No focal neurological deficits. Extremities: Symmetric 5 x 5 power. Left anterior shin area hematoma, slightly TTP Skin: No rashes, lesions or ulcers Psychiatry: Judgement and insight appear normal. Mood & affect appropriate.    Data Reviewed: I have personally reviewed following labs and imaging studies  CBC:  Recent Labs Lab 12/08/15 0845 12/08/15 0903 12/20/2015 1111 12/10/15 0258 12/11/15 0614  WBC 8.0  --  7.3 8.0 7.3    HGB 12.0 14.3 11.7* 10.1* 9.7*  HCT 37.2 42.0 36.8 31.9* 30.8*  MCV 79.3  --  78.1 78.8 79.6  PLT 154  --  169 152 152   Basic Metabolic Panel:  Recent Labs Lab 12/08/15 0845 12/08/15 0903 12/25/2015 1111 12/10/15 0258 12/11/15 0614  NA 132* 135 132* 131* 133*  K 4.4 4.5 5.5* 3.8 3.8  CL 94* 94* 94* 96* 96*  CO2 27  --  GLUCOSE 69 65 78 73 77  BUN CREATININE 3.81* 3.70* 5.31* 2.88* 4.78*  CALCIUM 9.3  --  9.1 8.0* 9.0   Liver Function Tests:  Recent Labs Lab 12/08/15 0845 12/12/2015 1111 12/10/15 0258 12/11/15 0614  AST 806* 835* 772* 789*  ALT 322* 320* 290* 286*  ALKPHOS 1247* 1212* 1058* 1062*  BILITOT 10.2* 10.2* 8.3* 9.2*  PROT 6.9 6.9 6.0* 5.9*  ALBUMIN 2.0* 1.9* 1.7* 1.6*    Recent Labs Lab 12/17/2015 1111  LIPASE 141*    Recent Labs Lab 12/02/2015 1111  AMMONIA 72*   Coagulation Profile:  Recent Labs Lab 12/12/2015 1111 12/10/15 0258 12/11/15 0614  INR 1.37 1.60* 1.47   Cardiac Enzymes:  Recent Labs Lab 12/11/2015 1111 12/25/2015 2042 12/10/15 0258 12/10/15 1255  CKTOTAL 372*  --   --   --   TROPONINI 0.22* 0.21* 0.20* 0.26*   CBG:  Recent Labs Lab 12/10/15 1631 12/10/15 2023 12/10/15 2345 12/11/15 0440 12/11/15 0745  GLUCAP 177* 114* 119* 83 78   Urine analysis:    Component Value Date/Time   COLORURINE AMBER* 10/05/2015 1229   APPEARANCEUR TURBID* 10/05/2015 1229   LABSPEC 1.020 10/05/2015 1229   PHURINE 5.0 10/05/2015 1229   GLUCOSEU NEGATIVE 10/05/2015 1229   HGBUR LARGE* 10/05/2015 1229   BILIRUBINUR LARGE* 10/05/2015 1229   KETONESUR 15* 10/05/2015 1229   PROTEINUR >300* 10/05/2015 1229   UROBILINOGEN 1.0 02/11/2014 0913   NITRITE NEGATIVE 10/05/2015 1229   LEUKOCYTESUR LARGE* 10/05/2015 1229   Recent Results (from the past 240 hour(s))  Culture, blood (Routine X 2) w Reflex to ID Panel     Status: None (Preliminary result)   Collection Time: 12/30/2015  1:30 PM  Result Value Ref Range Status    Specimen Description BLOOD RIGHT ANTECUBITAL  Final   Special Requests   Final    BOTTLES DRAWN AEROBIC AND ANAEROBIC  10CC AER 5CC ANA   Culture NO GROWTH 1 DAY  Final   Report Status PENDING  Incomplete  Culture, blood (Routine X 2) w Reflex to ID Panel     Status: None (Preliminary result)   Collection Time: 12/14/2015  1:35 PM  Result Value Ref Range Status   Specimen Description BLOOD RIGHT HAND  Final   Special Requests IN PEDIATRIC BOTTLE  4CC  Final   Culture NO GROWTH 1 DAY  Final   Report  Status PENDING  Incomplete  MRSA PCR Screening     Status: None   Collection Time: 07/13/2015 10:24 PM  Result Value Ref Range Status   MRSA by PCR NEGATIVE NEGATIVE Final    Comment:        The GeneXpert MRSA Assay (FDA approved for NASAL specimens only), is one component of a comprehensive MRSA colonization surveillance program. It is not intended to diagnose MRSA infection nor to guide or monitor treatment for MRSA infections.       Radiology Studies: [place] Abdomen Complete  12/26/2015  CLINICAL DATA:  Elevated liver enzymes EXAM: ABDOMEN ULTRASOUND COMPLETE COMPARISON:  CT abdomen and pelvis Oct 04, 2015 FINDINGS: Gallbladder: Surgically absent. Common bile duct: Diameter: 8 mm. No mass or calculus is visualized in the biliary ductal system. Liver: No focal lesion identified. Liver echogenicity is overall increased and mildly inhomogeneous. IVC: No abnormality visualized. Pancreas: Visualized portion unremarkable. Portions of the pancreas are obscured by gas. Spleen: Size and appearance within normal limits. Right Kidney: Length: 10.1 cm. Echogenicity within normal limits. No mass or hydronephrosis visualized. Left Kidney: Left kidney is markedly atrophic and calcified based on CT. Overlying gas in this area obscures the atrophic left kidney. Atrophic left kidney not appreciable by ultrasound. Abdominal aorta: No aneurysm visualized. Note that portions of the aorta are not well seen due to  overlying bowel gas. Other findings: No demonstrable ascites. IMPRESSION: Gallbladder absent. Common bile duct measures 8 mm which is within normal limits for post cholecystectomy state. No calculus or mass is seen in the biliary ductal system. Note that there is gas obscuring portions of the common bile duct. Liver echogenicity is mildly inhomogeneous and overall increased. This finding most likely is indicative of hepatic steatosis. While no focal liver lesions are identified, it must be cautioned that the sensitivity of ultrasound for focal liver lesions is diminished in this circumstance. Left kidney is known to be atrophic and calcified. It is not appreciable on this study. There is extensive gas in the bowel in the left abdomen which obscures the atrophic left kidney. Study otherwise unremarkable. Electronically Signed   By: Bretta BangWilliam  Woodruff III M.D.   On: Nov 13, 2015 16:02   Dg Chest Port 1 View  12/28/2015  CLINICAL DATA:  Weakness and shortness breath EXAM: PORTABLE CHEST 1 VIEW COMPARISON:  11/28/2015 chest radiograph. FINDINGS: Right internal jugular central venous catheter terminates in the lower third of the superior vena cava. Stable cardiomediastinal silhouette with mild cardiomegaly and small hiatal hernia. No pneumothorax. Trace bilateral pleural effusions. Cephalization of the pulmonary vasculature without overt pulmonary edema. Curvilinear opacities at both lung bases, favor mild atelectasis. IMPRESSION: 1. Stable mild cardiomegaly.  No overt pulmonary edema. 2. Trace bilateral pleural effusions and mild bibasilar curvilinear opacities, favor atelectasis. Electronically Signed   By: Delbert PhenixJason A Poff M.D.   On: Nov 13, 2015 11:23      Scheduled Meds: . calcium acetate  667 mg Oral TID WC  . [START ON 12/12/2015] doxercalciferol  2 mcg Intravenous Q T,Th,Sa-HD  . heparin  5,000 Units Subcutaneous Q8H  . insulin aspart  0-9 Units Subcutaneous Q4H  . [START ON 12/12/2015] midodrine  10 mg Oral Q  T,Th,Sa-HD  . multivitamin  1 tablet Oral Daily  . pantoprazole  40 mg Oral Daily  . sodium chloride flush  3 mL Intravenous Q12H   Continuous Infusions:    LOS: 2 days    Time spent: 20 minutes    MAGICK-Willam Munford, Sherlon HandingISKRA, MD [facility] Pager  [phone]  If 7PM-7AM, please contact night-coverage [email or web address] Password [email or web address] 12/11/2015, 11:17 AM

## 2015-12-11 NOTE — Progress Notes (Addendum)
Manual BP 72/40 HR 90. Patient asymptomatic.  Dr. Izola PriceMyers notified.  Will continue to monitor.  Will place order for stat ABG, CXR, one set troponin. Give NS bolus 500 cc and transfer to SDU. PCCM consulted.   Debbora PrestoMAGICK-Yaileen Hofferber, MD  [facility] Pager [phone]  If 7PM-7AM, please contact night-coverage [email or web address] Password [email or web address]

## 2015-12-11 NOTE — Progress Notes (Signed)
[facility] Progress Note  Assessment/Plan: 1. Jaundice/ ^LFT's - no biliary obstruction on US. GI evaluating 2. ESRD HD tts - next HD tomorrow K 3.8 3. Vol is at dry wt- Na low suspect EDW needs lowering  4. Chron hypotension on midodrine- her BP is much lower here than outpt center - pre and post BPs run 120 - 140 +/- range 5. IDDM, on insulin 6. Debility - WC bound at present; [place] [facility] 7. 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 Anemia hgb 9.7 - sig drop from outpt Hgb of 11.1 7/6  - down 2 gm since admission - last tsat was 72% in 6/29 and still on venofer - last Mircera was 50 on 6/8 - resume ESA; if drops any more needs hemocult 9. Nutrition alb 1.6 down due to acute liver issues- continue renal diet/vits/supplment  Sheffield SliderMartha B Bergman, PA-C [facility] Beeper [phone] 12/11/2015,11:03 AM  LOS: 2 days   Pt seen, examined and agree w A/P as above. Per GI cholestatic pattern of ^LFT's but no obstruction on US, checking ANA, autoimmune markers.  Will lower dry wt w HD as tolerated.  Vinson Moselleob Champagne Paletta MD [facility] pager [phone]    cell [phone] 12/11/2015, 11:20 AM    Subjective:   Feeling a little better . No c./o - tomorrow is her 82th  [place] Ouch[place]irthday. No problems with HD Saturday  Objective Filed Vitals:   12/10/15 1726 12/10/15 2023 12/11/15 0445 12/11/15 0747  BP: 96/70 80/48 98/38  93/37  Pulse: 76 86 82 86  Temp: 98 F (36.7 C) 98.7 F (37.1 C) 98.4 F (36.9 C) 98.5 F (36.9 C)  TempSrc: Oral Oral Oral Oral  Resp: 18 18 16 18   Weight:  107.2 kg (236 lb 5.3 oz)    SpO2: 98% 100% 99% 100%   Physical Exam General: NAD sats ok on O2 - doesn't use as outpt Heart: RRR Lungs: no rales Abdomen: soft NT Extremities: no LE edema Dialysis Access: right IJ left upper AVF maturing  Dialysis Orders: TTS NW 4h 108kg 2/2.25 bath Hep 6000 R IJ cath (maturing L AVF)  hect 2 ug venofer 50 Last hb  11.1  Additional Objective Labs: Basic Metabolic Panel:  Recent Labs Lab 12/17/2015 1111 12/10/15 0258 12/11/15 0614  NA 132* 131* 133*  K 5.5* 3.8 3.8  CL 94* 96* 96*  CO2 23 24 28   GLUCOSE 78 73 77  BUN 17 7 16   CREATININE 5.31* 2.88* 4.78*  CALCIUM 9.1 8.0* 9.0   Liver Function Tests:  Recent Labs Lab 12/10/2015 1111 12/10/15 0258 12/11/15 0614  AST 835* 772* 789*  ALT 320* 290* 286*  ALKPHOS 1212* 1058* 1062*  BILITOT 10.2* 8.3* 9.2*  PROT 6.9 6.0* 5.9*  ALBUMIN 1.9* 1.7* 1.6*    Recent Labs Lab 12/02/2015 1111  LIPASE 141*   CBC:  Recent Labs Lab 12/08/15 0845  12/19/2015 1111 12/10/15 0258 12/11/15 0614  WBC 8.0  --  7.3 8.0 7.3  HGB 12.0  < > 11.7* 10.1* 9.7*  HCT 37.2  < > 36.8 31.9* 30.8*  MCV 79.3  --  78.1 78.8 79.6  PLT 154  --  169 152 152  < > = values in this interval not displayed. Blood Culture    Component Value Date/Time   SDES BLOOD RIGHT HAND 12/24/2015 1335   SPECREQUEST IN PEDIATRIC BOTTLE  4CC 12/12/2015 1335   CULT NO GROWTH 1 DAY 12/08/2015  1335   REPTSTATUS PENDING 20-Dec-2015 1335    Cardiac Enzymes:  Recent Labs Lab 20-Dec-2015 1111 12-20-15 2042 12/10/15 0258 12/10/15 1255  CKTOTAL 372*  --   --   --   TROPONINI 0.22* 0.21* 0.20* 0.26*   CBG:  Recent Labs Lab 12/10/15 1631 12/10/15 2023 12/10/15 2345 12/11/15 0440 12/11/15 0745  GLUCAP 177* 114* 119* 83 78    Studies/Results: US Abdomen Complete  Dec 20, 2015  CLINICAL DATA:  Elevated liver enzymes EXAM: ABDOMEN ULTRASOUND COMPLETE COMPARISON:  CT abdomen and pelvis Oct 04, 2015 FINDINGS: Gallbladder: Surgically absent. Common bile duct: Diameter: 8 mm. No mass or calculus is visualized in the biliary ductal system. Liver: No focal lesion identified. Liver echogenicity is overall increased and mildly inhomogeneous. IVC: No abnormality visualized. Pancreas: Visualized portion unremarkable. Portions of the pancreas are obscured by gas. Spleen: Size and appearance  within normal limits. Right Kidney: Length: 10.1 cm. Echogenicity within normal limits. No mass or hydronephrosis visualized. Left Kidney: Left kidney is markedly atrophic and calcified based on CT. Overlying gas in this area obscures the atrophic left kidney. Atrophic left kidney not appreciable by ultrasound. Abdominal aorta: No aneurysm visualized. Note that portions of the aorta are not well seen due to overlying bowel gas. Other findings: No demonstrable ascites. IMPRESSION: Gallbladder absent. Common bile duct measures 8 mm which is within normal limits for post cholecystectomy state. No calculus or mass is seen in the biliary ductal system. Note that there is gas obscuring portions of the common bile duct. Liver echogenicity is mildly inhomogeneous and overall increased. This finding most likely is indicative of hepatic steatosis. While no focal liver lesions are identified, it must be cautioned that the sensitivity of ultrasound for focal liver lesions is diminished in this circumstance. Left kidney is known to be atrophic and calcified. It is not appreciable on this study. There is extensive gas in the bowel in the left abdomen which obscures the atrophic left kidney. Study otherwise unremarkable. Electronically Signed   By: Bretta Bang III M.D.   On: 2015/12/20 16:02   Dg Chest Port 1 View  2015-12-20  CLINICAL DATA:  Weakness and shortness breath EXAM: PORTABLE CHEST 1 VIEW COMPARISON:  11/28/2015 chest radiograph. FINDINGS: Right internal jugular central venous catheter terminates in the lower third of the superior vena cava. Stable cardiomediastinal silhouette with mild cardiomegaly and small hiatal hernia. No pneumothorax. Trace bilateral pleural effusions. Cephalization of the pulmonary vasculature without overt pulmonary edema. Curvilinear opacities at both lung bases, favor mild atelectasis. IMPRESSION: 1. Stable mild cardiomegaly.  No overt pulmonary edema. 2. Trace bilateral pleural  effusions and mild bibasilar curvilinear opacities, favor atelectasis. Electronically Signed   By: Delbert Phenix M.D.   On: 12-20-2015 11:23   Medications:   . calcium acetate  667 mg Oral TID WC  . heparin  5,000 Units Subcutaneous Q8H  . insulin aspart  0-9 Units Subcutaneous Q4H  . [START ON 12/12/2015] midodrine  10 mg Oral Q T,Th,Sa-HD  . multivitamin  1 tablet Oral Daily  . pantoprazole  40 mg Oral Daily  . sodium chloride flush  3 mL Intravenous Q12H

## 2015-12-11 NOTE — Progress Notes (Signed)
Report called to ParcAmanda on 2C.  All questions answered.

## 2015-12-11 NOTE — Consult Note (Signed)
PULMONARY / CRITICAL CARE MEDICINE   Name: Katherine [name] MRN: [id number] DOB: [birth year]    ADMISSION DATE:  December 16, 2015   CONSULTATION DATE: 12/11/15  REFERRING MD: Dr Izola Price  CHIEF COMPLAINT:  Hypotension  HISTORY OF PRESENT ILLNESS:   Katherine [name] is a 82 y.o. female with medical history significant for solitary right kidney and atrophic left kidney currently on hemodialysis (TTS), diabetes on insulin, hypertension, left ventricular diastolic dysfunction, dyslipidemia, morbid obesity and physical deconditioning. She presented to the ED on 12/16/2015 for a 1-2 week history of diarrhea, 4-5 stools per day. Negative for C. Diff on admission. PCCM was consulted for evaluation and treatment of hypotension.   PAST MEDICAL HISTORY :  She  has a past medical history of Hypertension; Renal disorder; High cholesterol; Type II diabetes mellitus (HCC); Sickle cell trait (HCC) (02/03/2012); Arthritis (02/03/2012); Difficulty sleeping; Nocturia; Pulmonary edema; Acute diastolic CHF (congestive heart failure) (HCC); OA (osteoarthritis) of knee; Altered mental status; Hypoglycemia; Dyslipidemia; Morbid obesity (HCC); ESRD on hemodialysis (HCC); and Renovascular hypertension.  PAST SURGICAL HISTORY: She  has past surgical history that includes Total knee arthroplasty (2011); Tubal ligation (1979); Hernia repair; CATARACTS; Total knee arthroplasty (Left, 01/20/2014); Joint replacement; Breast biopsy (1980's); Insertion of dialysis catheter (Right, 10/12/2015); and Bascilic vein transposition (Left, 10/12/2015).  Allergies  Allergen Reactions  . Other Anaphylaxis    Reaction to tree nuts  . Peanut-Containing Drug Products Anaphylaxis    Patient allergic to all nuts.     No current facility-administered medications on file prior to encounter.   Current Outpatient Prescriptions on File Prior to Encounter  Medication Sig  . acetaminophen (TYLENOL) 325 MG tablet Take 650 mg by mouth every 6 (six) hours as needed  (pain).   . calcium acetate (PHOSLO) 667 MG capsule Take 667 mg by mouth 3 (three) times daily with meals.  Marland Kitchen escitalopram (LEXAPRO) 5 MG tablet Take 1 tablet (5 mg total) by mouth daily.  . insulin lispro (HUMALOG) 100 UNIT/ML injection Inject 0-9 Units into the skin 3 (three) times daily before meals. Per sliding scale: CBG 0-120 0 units, 121-150 1 unit, 151-200 2 units, 201-250 3 units, 251-300 5 units, 301-350 7 units, 351-400 9 units, >400 call MD  . midodrine (PROAMATINE) 10 MG tablet Take 1 tablet (10 mg total) by mouth Every Tuesday,Thursday,and Saturday with dialysis.  Marland Kitchen multivitamin (RENA-VIT) TABS tablet Take 1 tablet by mouth at bedtime. (Patient taking differently: Take 1 tablet by mouth daily. )  . pantoprazole (PROTONIX) 40 MG tablet Take 1 tablet (40 mg total) by mouth daily.  . polyethylene glycol (MIRALAX) packet Take 17 g by mouth daily as needed. (Patient taking differently: Take 17 g by mouth daily. Mix in 8 oz liquid and drink)    FAMILY HISTORY:  Her has no family status information on file.   SOCIAL HISTORY: She  reports that she quit smoking about 26 years ago. Her smoking use included Cigarettes. She has a 25 pack-year smoking history. She has never used smokeless tobacco. She reports that she does not drink alcohol or use illicit drugs.  REVIEW OF SYSTEMS:   Negative except HPI  SUBJECTIVE:    VITAL SIGNS: BP 61/39 mmHg  Pulse 89  Temp(Src) 99.5 F (37.5 C) (Oral)  Resp 20  Wt 107.2 kg (236 lb 5.3 oz)  SpO2 2%  HEMODYNAMICS:    VENTILATOR SETTINGS:    INTAKE / OUTPUT: I/O last 3 completed shifts: In: 780 [P.O.:780] Out: 0   PHYSICAL EXAMINATION:  General: Elderly obese female Neuro:  Awake and alert, no focal deficits  HEENT: Moist mucous membranes, no stridor Cardiovascular:  S1S2, No JVD Lungs: CTA Abdomen:  Obese, BS x 4 ,  Musculoskeletal: MAEW Skin: No issues  LABS:  BMET  Recent Labs Lab Dec 30, 2015 1111 12/10/15 0258  12/11/15 0614  NA 132* 131* 133*  K 5.5* 3.8 3.8  CL 94* 96* 96*  CO2 23 24 28   BUN 17 7 16   CREATININE 5.31* 2.88* 4.78*  GLUCOSE 78 73 77    Electrolytes  Recent Labs Lab 12/30/2015 1111 12/10/15 0258 12/11/15 0614  CALCIUM 9.1 8.0* 9.0    CBC  Recent Labs Lab 12-30-15 1111 12/10/15 0258 12/11/15 0614  WBC 7.3 8.0 7.3  HGB 11.7* 10.1* 9.7*  HCT 36.8 31.9* 30.8*  PLT 169 152 152    Coag's  Recent Labs Lab 12/30/2015 1111 12/10/15 0258 12/11/15 0614  APTT  --  102*  --   INR 1.37 1.60* 1.47    Sepsis Markers No results for input(s): LATICACIDVEN, PROCALCITON, O2SATVEN in the last 168 hours.  ABG No results for input(s): PHART, PCO2ART, PO2ART in the last 168 hours.  Liver Enzymes  Recent Labs Lab 30-Dec-2015 1111 12/10/15 0258 12/11/15 0614  AST 835* 772* 789*  ALT 320* 290* 286*  ALKPHOS 1212* 1058* 1062*  BILITOT 10.2* 8.3* 9.2*  ALBUMIN 1.9* 1.7* 1.6*    Cardiac Enzymes  Recent Labs Lab 30-Dec-2015 2042 12/10/15 0258 12/10/15 1255  TROPONINI 0.21* 0.20* 0.26*    Glucose  Recent Labs Lab 12/10/15 2345 12/11/15 0440 12/11/15 0745 12/11/15 1141 12/11/15 1727 12/11/15 1839  GLUCAP 119* 83 78 112* 120* 127*    Imaging No results found.   STUDIES:  7/8: US Abdomen: Gallbladder absent. Common bile duct measures 8 mm which is within normal limits for post cholecystectomy state. No calculus or mass is seen in the biliary ductal system. Note that there is gas obscuring portions of the common bile duct. Liver echogenicity is mildly inhomogeneous and overall increased. This finding most likely is indicative of hepatic steatosis. While no focal liver lesions are identified, it must be cautioned that the sensitivity of ultrasound for focal liver lesions is diminished in this circumstance. Left kidney is known to be atrophic and calcified. It is not appreciable on this study. There is extensive gas in the bowel in the left abdomen which obscures  the atrophic left kidney.  7/8: CXR: Stable mild cardiomegaly. No overt pulmonary edema. Trace bilateral pleural effusions and mild bibasilar curvilinear opacities, favor atelectasis.   CULTURES: 7/9: C. Diff negative  ANTIBIOTICS: none  SIGNIFICANT EVENTS: 7/10: Hypotensive to the 70's with mild confusion. Resolved spontaneously.   LINES/TUBES: 10/12/15: HD cath   DISCUSSION: 82 yo female presenting with 1-2 week history of diarrhea. C/O weakness and shortness of breath. She had a brief episode of hypotension with her pressures in the 70's. PCCM was consulted for evaluation and treatment of hypotension. A fluid bolus was hanging with only about 50 ml infused when we arrived and hypotension had resolved spontaneously and was 105/73, HR 90. She was mentating normally. No c/o of chest pain, shortness of breath or dizziness. Unclear whether this was measurement error or true hypotension. Concern hypovolemia with history of diarrhea and new HD patient. Less likely sepsis. She is chronically hypotensive on midodrine, however, BP at outpatient HD center typically 120s per nephrology note.   ASSESSMENT / PLAN:  Hypotension >> improved -Reasonable to transfer to SD under  hospitalist care for continued BP monitoring -Gentle IV rehydration as patient tolerates -Continue midodrine on HD days, may be of value to increase this. Defer to primary/nephrology. -If reoccurs assess lactic acid level and attempt volume resuscitation -May need ICU transfer for vasoactive infusions if unresponsive to fluid   PCCM will sign off. Please re-consult if needed.   Joneen RoachPaul Lanayah Gartley, AGACNP-BC [facility] Pager [phone] or [phone]  12/11/2015 7:47 PM

## 2015-12-11 NOTE — Progress Notes (Signed)
PT Cancellation Note  Patient Details Name: Katherine [name] MRN: [id number] DOB: 01/16/1934   Cancelled Treatment:    Reason Eval/Treat Not Completed: Medical issues which prohibited therapy.  Elevating troponins and very low BP.  Will check later as pt and time allow.   Ivar DrapeStout, Coden Franchi E 12/11/2015, 8:49 AM    Samul Dadauth Tyleigh Mahn, PT MS Acute Rehab Dept. Number: [facility] [phone] and [facility] [phone]

## 2015-12-11 NOTE — Progress Notes (Signed)
[facility] PROGRESS NOTE Subjective Reports diarrhea resolved. Feels better  Objective: Vital signs in last 24 hours: Temp:  [98 F (36.7 C)-98.7 F (37.1 C)] 98.5 F (36.9 C) (07/10 0747) Pulse Rate:  [70-86] 86 (07/10 0747) Resp:  [16-18] 18 (07/10 0747) BP: (80-101)/(37-70) 93/37 mmHg (07/10 0747) SpO2:  [96 %-100 %] 100 % (07/10 0747) Weight:  [107.2 kg (236 lb 5.3 oz)] 107.2 kg (236 lb 5.3 oz) (07/09 2023) Last BM Date: 12/10/15  Intake/Output from previous day: 07/09 0701 - 07/10 0700 In: 480 [P.O.:480] Out: -  Intake/Output this shift:    PE: General--icteric  Abdomen--midline fullness due to scar nontender obese  Lab Results:  Recent Labs  Nov 21, 2015 1111 12/10/15 0258 12/11/15 0614  WBC 7.3 8.0 7.3  HGB 11.7* 10.1* 9.7*  HCT 36.8 31.9* 30.8*  PLT 169 152 152   BMET  Recent Labs  Nov 21, 2015 1111 12/10/15 0258 12/11/15 0614  NA 132* 131* 133*  K 5.5* 3.8 3.8  CL 94* 96* 96*  CO2 23 24 28   CREATININE 5.31* 2.88* 4.78*   LFT  Recent Labs  Nov 21, 2015 1111 12/10/15 0258 12/11/15 0614  PROT 6.9 6.0* 5.9*  AST 835* 772* 789*  ALT 320* 290* 286*  ALKPHOS 1212* 1058* 1062*  BILITOT 10.2* 8.3* 9.2*   PT/INR  Recent Labs  Nov 21, 2015 1111 12/10/15 0258 12/11/15 0614  LABPROT 16.9* 19.1* 17.9*  INR 1.37 1.60* 1.47   PANCREAS  Recent Labs  Nov 21, 2015 1111  LIPASE 141*         Studies/Results: [place] Abdomen Complete  12/17/2015  CLINICAL DATA:  Elevated liver enzymes EXAM: ABDOMEN ULTRASOUND COMPLETE COMPARISON:  CT abdomen and pelvis Oct 04, 2015 FINDINGS: Gallbladder: Surgically absent. Common bile duct: Diameter: 8 mm. No mass or calculus is visualized in the biliary ductal system. Liver: No focal lesion identified. Liver echogenicity is overall increased and mildly inhomogeneous. IVC: No abnormality visualized. Pancreas: Visualized portion unremarkable. Portions of the pancreas are obscured by gas. Spleen: Size and appearance  within normal limits. Right Kidney: Length: 10.1 cm. Echogenicity within normal limits. No mass or hydronephrosis visualized. Left Kidney: Left kidney is markedly atrophic and calcified based on CT. Overlying gas in this area obscures the atrophic left kidney. Atrophic left kidney not appreciable by ultrasound. Abdominal aorta: No aneurysm visualized. Note that portions of the aorta are not well seen due to overlying bowel gas. Other findings: No demonstrable ascites. IMPRESSION: Gallbladder absent. Common bile duct measures 8 mm which is within normal limits for post cholecystectomy state. No calculus or mass is seen in the biliary ductal system. Note that there is gas obscuring portions of the common bile duct. Liver echogenicity is mildly inhomogeneous and overall increased. This finding most likely is indicative of hepatic steatosis. While no focal liver lesions are identified, it must be cautioned that the sensitivity of ultrasound for focal liver lesions is diminished in this circumstance. Left kidney is known to be atrophic and calcified. It is not appreciable on this study. There is extensive gas in the bowel in the left abdomen which obscures the atrophic left kidney. Study otherwise unremarkable. Electronically Signed   By: Bretta BangWilliam  Woodruff III M.D.   On: 04/08/2016 16:02   Dg Chest Port 1 View  12/12/2015  CLINICAL DATA:  Weakness and shortness breath EXAM: PORTABLE CHEST 1 VIEW COMPARISON:  11/28/2015 chest radiograph. FINDINGS: Right internal jugular central venous catheter terminates in the lower third of the superior vena cava. Stable cardiomediastinal silhouette with mild  cardiomegaly and small hiatal hernia. No pneumothorax. Trace bilateral pleural effusions. Cephalization of the pulmonary vasculature without overt pulmonary edema. Curvilinear opacities at both lung bases, favor mild atelectasis. IMPRESSION: 1. Stable mild cardiomegaly.  No overt pulmonary edema. 2. Trace bilateral pleural  effusions and mild bibasilar curvilinear opacities, favor atelectasis. Electronically Signed   By: Delbert Phenix M.D.   On: 01/08/16 11:23    Medications: I have reviewed the patient's current medications.  Assessment/Plan: 1. Abnormal LFTs. Cholestatic pattern but no obstruction on [place]. Will check ANA, autoimmune markers etc.   Ruie Sendejo JR,Leasia Swann L 12/11/2015, 9:43 AM  This note was created using voice recognition software. Minor errors may Have occurred unintentionally.  Pager: [phone] If no answer or after hours call [phone]

## 2015-12-11 NOTE — Progress Notes (Signed)
Patient complain "not able to catch my breath".  Vitals BP 61/39 HR 89, 100% O2 1.5 L, R 20.  Dr. Izola PriceMyers and Angelique Blonderenise with rapid response notified.

## 2015-12-11 NOTE — Significant Event (Signed)
Rapid Response Event Note  Overview:  Called by Rn for low SBP 60's  Time Called: 1832 Arrival Time: 1836 Event Type: Hypotension  Initial Focused Assessment:  Upon my arrival to patients room, RN at bedside.  Patient lying in trendelenburg on nasal cannula 1.5 liters.  As per RN patient had low SBP in the 70's today and was asymptomatic, now BP in the 60's and patient c/o SOB and RN states she is confused.  Patient skin is warm and dry, has heating pad on left leg for hematoma on left shin, Rn states it may be slightly larger than earlier in day. HR 90's, RR 14, Sats 100%   Interventions:  MD paged and updated, orders received.  NS bolus started prior to my arrival.  BP increasing SBP 93, rechecked 105/43. Patient states she is feeling better and breathing seems to be improved, however patient wants to remain in trendelenburg for now.  ABG done, awaiting results. CCM consulted and at bedside, lab at bedside to draw labs  Plan of Care (if not transferred):Patient to be monitored and transported to SDU  Event Summary:  Patient is improved and BP increased, Rn to call if assistance needed   at      at          [facility], Maryagnes Amosenise Ann

## 2015-12-12 ENCOUNTER — Inpatient Hospital Stay (HOSPITAL_COMMUNITY): Payer: Medicare Other

## 2015-12-12 DIAGNOSIS — R197 Diarrhea, unspecified: Secondary | ICD-10-CM | POA: Insufficient documentation

## 2015-12-12 DIAGNOSIS — K838 Other specified diseases of biliary tract: Secondary | ICD-10-CM

## 2015-12-12 DIAGNOSIS — I10 Essential (primary) hypertension: Secondary | ICD-10-CM

## 2015-12-12 DIAGNOSIS — E785 Hyperlipidemia, unspecified: Secondary | ICD-10-CM

## 2015-12-12 DIAGNOSIS — R0600 Dyspnea, unspecified: Secondary | ICD-10-CM | POA: Insufficient documentation

## 2015-12-12 DIAGNOSIS — I959 Hypotension, unspecified: Secondary | ICD-10-CM | POA: Insufficient documentation

## 2015-12-12 LAB — CBC
HCT: 29.9 % — ABNORMAL LOW (ref 36.0–46.0)
HEMOGLOBIN: 9.2 g/dL — AB (ref 12.0–15.0)
MCH: 24.6 pg — ABNORMAL LOW (ref 26.0–34.0)
MCHC: 30.8 g/dL (ref 30.0–36.0)
MCV: 79.9 fL (ref 78.0–100.0)
PLATELETS: 161 10*3/uL (ref 150–400)
RBC: 3.74 MIL/uL — AB (ref 3.87–5.11)
RDW: 18.8 % — ABNORMAL HIGH (ref 11.5–15.5)
WBC: 8.1 10*3/uL (ref 4.0–10.5)

## 2015-12-12 LAB — COMPREHENSIVE METABOLIC PANEL
ALK PHOS: 1087 U/L — AB (ref 38–126)
ALT: 288 U/L — ABNORMAL HIGH (ref 14–54)
ANION GAP: 10 (ref 5–15)
AST: 762 U/L — ABNORMAL HIGH (ref 15–41)
Albumin: 1.5 g/dL — ABNORMAL LOW (ref 3.5–5.0)
BILIRUBIN TOTAL: 10.1 mg/dL — AB (ref 0.3–1.2)
BUN: 26 mg/dL — ABNORMAL HIGH (ref 6–20)
CALCIUM: 8.8 mg/dL — AB (ref 8.9–10.3)
CO2: 27 mmol/L (ref 22–32)
Chloride: 97 mmol/L — ABNORMAL LOW (ref 101–111)
Creatinine, Ser: 6.22 mg/dL — ABNORMAL HIGH (ref 0.44–1.00)
GFR calc non Af Amer: 6 mL/min — ABNORMAL LOW (ref >60)
GFR, EST AFRICAN AMERICAN: 7 mL/min — AB (ref >60)
Glucose, Bld: 88 mg/dL (ref 65–99)
Potassium: 4 mmol/L (ref 3.5–5.1)
SODIUM: 134 mmol/L — AB (ref 135–145)
TOTAL PROTEIN: 5.9 g/dL — AB (ref 6.5–8.1)

## 2015-12-12 LAB — PROTEIN ELECTROPHORESIS, SERUM
A/G Ratio: 0.5 — ABNORMAL LOW (ref 0.7–1.7)
ALBUMIN ELP: 1.9 g/dL — AB (ref 2.9–4.4)
Alpha-1-Globulin: 0.3 g/dL (ref 0.0–0.4)
Alpha-2-Globulin: 0.6 g/dL (ref 0.4–1.0)
Beta Globulin: 1.3 g/dL (ref 0.7–1.3)
GAMMA GLOBULIN: 1.4 g/dL (ref 0.4–1.8)
GLOBULIN, TOTAL: 3.6 g/dL (ref 2.2–3.9)
TOTAL PROTEIN ELP: 5.5 g/dL — AB (ref 6.0–8.5)

## 2015-12-12 LAB — GLUCOSE, CAPILLARY
GLUCOSE-CAPILLARY: 134 mg/dL — AB (ref 65–99)
GLUCOSE-CAPILLARY: 165 mg/dL — AB (ref 65–99)
GLUCOSE-CAPILLARY: 97 mg/dL (ref 65–99)
Glucose-Capillary: 132 mg/dL — ABNORMAL HIGH (ref 65–99)
Glucose-Capillary: 131 mg/dL — ABNORMAL HIGH (ref 65–99)

## 2015-12-12 LAB — TROPONIN I
Troponin I: 0.27 ng/mL (ref <0.03)
Troponin I: 0.28 ng/mL (ref <0.03)

## 2015-12-12 LAB — ANTI-SMOOTH MUSCLE ANTIBODY, IGG: F-ACTIN AB IGG: 21 U — AB (ref 0–19)

## 2015-12-12 LAB — MITOCHONDRIAL ANTIBODIES: Mitochondrial M2 Ab, IgG: 4.9 Units (ref 0.0–20.0)

## 2015-12-12 MED ORDER — DOXERCALCIFEROL 4 MCG/2ML IV SOLN
INTRAVENOUS | Status: AC
  Administered 2015-12-12: 2 ug via INTRAVENOUS
  Filled 2015-12-12: qty 2

## 2015-12-12 MED ORDER — PENTAFLUOROPROP-TETRAFLUOROETH EX AERO: 1.0000 "application " | INHALATION_SPRAY | CUTANEOUS | Status: DC | PRN

## 2015-12-12 MED ORDER — ALTEPLASE 2 MG IJ SOLR: 2.0000 mg | Freq: Once | INTRAMUSCULAR | Status: DC | PRN

## 2015-12-12 MED ORDER — DOXYCYCLINE HYCLATE 100 MG PO TABS
100.0000 mg | ORAL_TABLET | Freq: Two times a day (BID) | ORAL | Status: DC
  Administered 2015-12-12 – 2015-12-17 (×9): 100 mg via ORAL
  Filled 2015-12-12 (×10): qty 1

## 2015-12-12 MED ORDER — HEPARIN SODIUM (PORCINE) 1000 UNIT/ML DIALYSIS: 20.0000 [IU]/kg | INTRAMUSCULAR | Status: DC | PRN

## 2015-12-12 MED ORDER — SODIUM CHLORIDE 0.9 % IV SOLN: 100.0000 mL | INTRAVENOUS | Status: DC | PRN

## 2015-12-12 MED ORDER — DARBEPOETIN ALFA 60 MCG/0.3ML IJ SOSY
PREFILLED_SYRINGE | INTRAMUSCULAR | Status: AC
  Administered 2015-12-12: 60 ug via INTRAVENOUS
  Filled 2015-12-12: qty 0.3

## 2015-12-12 MED ORDER — LIDOCAINE HCL (PF) 1 % IJ SOLN: 5.0000 mL | INTRAMUSCULAR | Status: DC | PRN

## 2015-12-12 MED ORDER — HEPARIN SODIUM (PORCINE) 1000 UNIT/ML DIALYSIS: 1000.0000 [IU] | INTRAMUSCULAR | Status: DC | PRN

## 2015-12-12 MED ORDER — LIDOCAINE-PRILOCAINE 2.5-2.5 % EX CREA: 1.0000 "application " | TOPICAL_CREAM | CUTANEOUS | Status: DC | PRN

## 2015-12-12 MED ORDER — MIDODRINE HCL 5 MG PO TABS
ORAL_TABLET | ORAL | Status: AC
  Filled 2015-12-12: qty 2
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
## 2015-12-12 NOTE — Progress Notes (Signed)
Patient ID: Katherine [name], female   DOB: August 22, 1933, 82 y.o.   MRN: [id number]   PROGRESS NOTE    SHIMEKA BACOT  MWU:[id number] DOB: 12/21/1933 DOA: 12/10/2015  PCP: [name], MD   Brief Narrative:  82 y.o. Female with ESRD on HD, DM type II, resident of [facility] and in process of moving to ALF, presented to [facility] for evaluation of 1-2 weeks duration of diarrhea and had blood work notable for elevated liver enzymes, mixed cholestatic and hepatocellular pattern. Bilirubin is 10, alkaline phosphatase 1200, transaminases in the 300-800 range. Platelets are normal at 169,000, and INR is 1.37. For comparison, 2 months ago the patient's liver chemistries were completely normal and her INR at that time was 1.03.  An abdominal ultrasound notable for significant hepatic steatosis, status post cholecystectomy, 8 mm common bile duct consistent with prior cholecystectomy state but no intrahepatic biliary ductal dilatation, no space-occupying lesions of the liver.  Assessment & Plan:   Principal Problem: New-onset elevation of liver enzymes with jaundice - mixed hepatocellular/cholestatic pattern, without evidence of anatomic biliary obstruction or focal hepatic parenchymal lesions - LFT's better since admission but unchanged in the past 48 hours  - repeat LFT's in AM, autoimmune work up pending so far - appreciate GI team following  - continue with conservative management for now   Active Problems:   Hypotension 7/10 - requiring transfer to SDU - pt at baseline hypotensive and on midodrine, will continue  - PCCM consulted and has signed off as pt has stabilized  - possible transfer to tele unit in next 24 hours if no further hypotensive episodes     Elevated troponins - in the setting of hypotension  - cardiology consulted, will follow up on recommendations    Morbid obesity (HCC) - Body mass index is 40.43 kg/(m^2).     ESRD on dialysis [facility]) - per nephrology, appreciate assistance      Diarrhea - resolved, contact precautions d/c    Left calf hematoma - after trauma to the area - looks more red and TTP, will start doxycycline and monitor clinical response - get CT of the area to rule out abscess     Diabetes mellitus, insulin dependent (IDDM), with complications of nephropathy  - reasonable inpatient control  - keep on SSI for now    Acute hyperkalemia - resolved     Physical deconditioning - PT eval requested     Left ventricular diastolic dysfunction, NYHA class 1 - monitor weights  - stable 235 - 238 lbs   DVT prophylaxis: Heparin SQ Code Status: Full  Family Communication: Patient at bedside  Disposition Plan: SNF once cleared from GI stand point   Consultants:   GI  Nephrology   Cardiology   Procedures:   None  Antimicrobials:   Doxycycline 7/11 -->  Subjective: No events overnight.   Objective: Filed Vitals:   12/12/15 1030 12/12/15 1100 12/12/15 1106 12/12/15 1223  BP: 104/53 91/50 102/52 111/44  Pulse: 74 74 76 78  Temp:   97.7 F (36.5 C) 98.5 F (36.9 C)  TempSrc:   Oral Oral  Resp: 15 16 14 15   Weight:   106.9 kg (235 lb 10.8 oz)   SpO2: 100% 100% 100% 100%    Intake/Output Summary (Last 24 hours) at 12/12/15 1619 Last data filed at 12/12/15 1500  Gross per 24 hour  Intake    420 ml  Output    850 ml  Net   -430  ml   Filed Weights   12/11/15 2016 12/12/15 0654 12/12/15 1106  Weight: 108 kg (238 lb 1.6 oz) 108.6 kg (239 lb 6.7 oz) 106.9 kg (235 lb 10.8 oz)    Examination:  General exam: Appears calm and comfortable, jaundiced sclera  Respiratory system: Respiratory effort normal. Diminished air movement at bases Cardiovascular system: S1 & S2 heard, RRR. No rubs, gallops or clicks. No pedal edema. Gastrointestinal system: Abdomen is nondistended, soft and nontender. No organomegaly or masses felt.  Central nervous system: Alert and oriented. No focal neurological deficits. Extremities: Symmetric 5 x 5  power. Left anterior shin area hematoma, slightly TTP and with surrounding erythema, warmth to touch  Psychiatry: Judgement and insight appear normal. Mood & affect appropriate.   Data Reviewed: I have personally reviewed following labs and imaging studies  CBC:  Recent Labs Lab 12/08/15 0845 12/08/15 0903 12/15/2015 1111 12/10/15 0258 12/11/15 0614 12/12/15 0644  WBC 8.0  --  7.3 8.0 7.3 8.1  HGB 12.0 14.3 11.7* 10.1* 9.7* 9.2*  HCT 37.2 42.0 36.8 31.9* 30.8* 29.9*  MCV 79.3  --  78.1 78.8 79.6 79.9  PLT 154  --  169 152 152 161   Basic Metabolic Panel:  Recent Labs Lab 12/08/15 0845 12/08/15 0903 12/15/2015 1111 12/10/15 0258 12/11/15 0614 12/12/15 0644  NA 132* 135 132* 131* 133* 134*  K 4.4 4.5 5.5* 3.8 3.8 4.0  CL 94* 94* 94* 96* 96* 97*  CO2 27  --  23 24 28 27   GLUCOSE 69 65 78 73 77 88  BUN 9 10 17 7 16  26*  CREATININE 3.81* 3.70* 5.31* 2.88* 4.78* 6.22*  CALCIUM 9.3  --  9.1 8.0* 9.0 8.8*   Liver Function Tests:  Recent Labs Lab 12/08/15 0845 12/20/2015 1111 12/10/15 0258 12/11/15 0614 12/12/15 0644  AST 806* 835* 772* 789* 762*  ALT 322* 320* 290* 286* 288*  ALKPHOS 1247* 1212* 1058* 1062* 1087*  BILITOT 10.2* 10.2* 8.3* 9.2* 10.1*  PROT 6.9 6.9 6.0* 5.9* 5.9*  ALBUMIN 2.0* 1.9* 1.7* 1.6* 1.5*    Recent Labs Lab 12/23/2015 1111  LIPASE 141*    Recent Labs Lab 12/25/2015 1111  AMMONIA 72*   Coagulation Profile:  Recent Labs Lab 12/07/2015 1111 12/10/15 0258 12/11/15 0614  INR 1.37 1.60* 1.47   Cardiac Enzymes:  Recent Labs Lab 12/24/2015 1111  12/10/15 0258 12/10/15 1255 12/11/15 1859 12/12/15 0020 12/12/15 0644  CKTOTAL 372*  --   --   --   --   --   --   TROPONINI 0.22*  < > 0.20* 0.26* 0.28* 0.28* 0.27*  < > = values in this interval not displayed. CBG:  Recent Labs Lab 12/11/15 1839 12/11/15 2025 12/11/15 2358 12/12/15 0313 12/12/15 1222  GLUCAP 127* 118* 165* 97 132*   Urine analysis:    Component Value Date/Time     COLORURINE AMBER* 10/05/2015 1229   APPEARANCEUR TURBID* 10/05/2015 1229   LABSPEC 1.020 10/05/2015 1229   PHURINE 5.0 10/05/2015 1229   GLUCOSEU NEGATIVE 10/05/2015 1229   HGBUR LARGE* 10/05/2015 1229   BILIRUBINUR LARGE* 10/05/2015 1229   KETONESUR 15* 10/05/2015 1229   PROTEINUR >300* 10/05/2015 1229   UROBILINOGEN 1.0 02/11/2014 0913   NITRITE NEGATIVE 10/05/2015 1229   LEUKOCYTESUR LARGE* 10/05/2015 1229   Recent Results (from the past 240 hour(s))  Culture, blood (Routine X 2) w Reflex to ID Panel     Status: None (Preliminary result)   Collection Time: 12/08/2015  1:30  PM  Result Value Ref Range Status   Specimen Description BLOOD RIGHT ANTECUBITAL  Final   Special Requests   Final    BOTTLES DRAWN AEROBIC AND ANAEROBIC  10CC AER 5CC ANA   Culture NO GROWTH 3 DAYS  Final   Report Status PENDING  Incomplete  Culture, blood (Routine X 2) w Reflex to ID Panel     Status: None (Preliminary result)   Collection Time: 12/30/2015  1:35 PM  Result Value Ref Range Status   Specimen Description BLOOD RIGHT HAND  Final   Special Requests IN PEDIATRIC BOTTLE  4CC  Final   Culture NO GROWTH 3 DAYS  Final   Report Status PENDING  Incomplete  MRSA PCR Screening     Status: None   Collection Time: 12/05/2015 10:24 PM  Result Value Ref Range Status   MRSA by PCR NEGATIVE NEGATIVE Final    Comment:        The GeneXpert MRSA Assay (FDA approved for NASAL specimens only), is one component of a comprehensive MRSA colonization surveillance program. It is not intended to diagnose MRSA infection nor to guide or monitor treatment for MRSA infections.   C difficile quick screen w PCR reflex     Status: None   Collection Time: 12/10/15  9:34 PM  Result Value Ref Range Status   C Diff antigen NEGATIVE NEGATIVE Final   C Diff toxin NEGATIVE NEGATIVE Final   C Diff interpretation No C. difficile detected.  Final      Radiology Studies: Dg Chest 1 View  12/11/2015  CLINICAL DATA:   Dyspnea. EXAM: CHEST 1 VIEW COMPARISON:  12/09/2014 FINDINGS: Dual lumen right central venous catheter is stable. Cardiomediastinal silhouette is mildly enlarged. Mediastinal contours appear intact. There is no evidence of pneumothorax. There are low lung volumes with prominence of the interstitial markings which may represent interstitial pulmonary edema. Streaky airspace opacities is seen in the left lower lobe, with focal airspace consolidation can't be excluded. There probable bilateral subpulmonic effusions. Osseous structures are without acute abnormality. Soft tissues are grossly normal. IMPRESSION: Low lung volumes with interstitial pulmonary edema and probable bilateral small subpulmonic effusions. More focal airspace consolidation in the left lower lobe cannot be excluded. Electronically Signed   By: Ted Mcalpine M.D.   On: 12/11/2015 22:54      Scheduled Meds: . calcium acetate  667 mg Oral TID WC  . darbepoetin (ARANESP) injection - DIALYSIS  60 mcg Intravenous Q Tue-HD  . doxercalciferol  2 mcg Intravenous Q T,Th,Sa-HD  . doxycycline  100 mg Oral Q12H  . feeding supplement (NEPRO CARB STEADY)  237 mL Oral BID BM  . heparin  5,000 Units Subcutaneous Q8H  . insulin aspart  0-9 Units Subcutaneous Q4H  . midodrine  10 mg Oral Q T,Th,Sa-HD  . multivitamin  1 tablet Oral Daily  . pantoprazole  40 mg Oral Daily  . sodium chloride flush  3 mL Intravenous Q12H   Continuous Infusions:    LOS: 3 days   Time spent: 20 minutes   Debbora Presto, MD [facility] Pager [phone]  If 7PM-7AM, please contact night-coverage [email or web address] Password [facility] 12/12/2015, 4:19 PM

## 2015-12-12 NOTE — Evaluation (Signed)
Physical Therapy Evaluation Patient Details Name: Katherine [name] MRN: [id number] DOB: 03-06-1934 Today's Date: 12/12/2015   History of Present Illness  Katherine [name] is a 82 y.o. female with medical history significant for solitary right kidney and atrophic left kidney currently on hemodialysis (TTS), diabetes on insulin, hypertension, left ventricular diastolic dysfunction, dyslipidemia, morbid obesity and physical deconditioning. She presented to the ED on 12/14/2015 for a 1-2 week history of diarrhea, 4-5 stools per day.  Clinical Impression  Patient presents with decreased independence with mobility due to deficits listed in PT problem list.  She will benefit from skilled PT in the acute setting to allow return to ALF following SNF level rehab stay.  Patient wants to return to ALF and get HHPT (states no more days under skilled care?) but likely more assist needed at this time than ALF can provide.  PT to follow, and will update recommendations if pt progresses.     Follow Up Recommendations SNF    Equipment Recommendations  None recommended by PT    Recommendations for Other Services       Precautions / Restrictions Precautions Precautions: Fall      Mobility  Bed Mobility Overal bed mobility: Needs Assistance Bed Mobility: Rolling;Sidelying to Sit;Sit to Supine Rolling: Mod assist Sidelying to sit: +2 for physical assistance;Mod assist   Sit to supine: Mod assist;+2 for physical assistance   General bed mobility comments: Patient rolled with rail and cues, assist for hips, then assist to bring legs off bed and to lift trunk, to supine with assist for feet into bed and +2 to scoot to [facility] and for positioning on her side  Transfers                 General transfer comment: NT due to c/o L LE pain with hemotoma  Ambulation/Gait                Stairs            Wheelchair Mobility    Modified Rankin (Stroke Patients Only)       Balance Overall  balance assessment: Needs assistance Sitting-balance support: No upper extremity supported;Feet supported Sitting balance-Leahy Scale: Good Sitting balance - Comments: maintains balance at EOB while drinking from a cup and while performing LE extensions       Standing balance comment: NT                             Pertinent Vitals/Pain Pain Assessment: Faces Faces Pain Scale: Hurts little more Pain Location: L lower leg at hematoma Pain Descriptors / Indicators: Discomfort;Sore Pain Intervention(s): Repositioned;Monitored during session;Heat applied    Home Living Family/patient expects to be discharged to:: Assisted living               Home Equipment: Walker - 2 wheels;Cane - single point;Bedside commode;Shower seat;Wheelchair - manual Additional Comments: has been getting around in wheelchair, reports recently more difficulty getting up to bathroom with staff help with grabbar    Prior Function Level of Independence: Needs assistance   Gait / Transfers Assistance Needed: getting around in w/c, assist for transfers lately (reports MD last week set up for HHPT)           Hand Dominance        Extremity/Trunk Assessment   Upper Extremity Assessment: RUE deficits/detail;LUE deficits/detail RUE Deficits / Details: AAROM grossly WFL, strength shoulder flexion 2-/5, elbow flexion 3+/5  LUE Deficits / Details: AAROM grossly WFL, strength shoulder flexion 2/5, elbow flexion 3/5   Lower Extremity Assessment: RLE deficits/detail;LLE deficits/detail RLE Deficits / Details: AAROM limited ankle DF and knee flexion about -20 degrees dorsiflexion and knee flexion to 60 degrees supine, 80 in sitting LLE Deficits / Details: AAROM limited ankle DF and knee flexion about -20 degrees dorsiflexion and knee flexion to 60 degrees supine, 80 in sitting     Communication   Communication: No difficulties  Cognition Arousal/Alertness: Awake/alert Behavior During  Therapy: WFL for tasks assessed/performed Overall Cognitive Status: Within Functional Limits for tasks assessed                      General Comments General comments (skin integrity, edema, etc.): Patient relates fatigue following dialysis and that her primary MD had already set up HHPT, but had not yet started,  States needing little more help lately to get up to toilet in bathroom.     Exercises General Exercises - Lower Extremity Ankle Circles/Pumps: AROM;Both;10 reps;Supine Long Arc Quad: AROM;Both;5 reps;Seated Heel Slides: AROM;AAROM;5 reps;Both;Supine      Assessment/Plan    PT Assessment Patient needs continued PT services  PT Diagnosis Generalized weakness   PT Problem List Decreased strength;Decreased activity tolerance;Decreased balance;Decreased mobility;Pain;Decreased safety awareness;Decreased knowledge of use of DME  PT Treatment Interventions DME instruction;Balance training;Functional mobility training;Patient/family education;Therapeutic activities;Therapeutic exercise;Wheelchair mobility training;Gait training   PT Goals (Current goals can be found in the Care Plan section) Acute Rehab PT Goals Patient Stated Goal: [facility] PT Goal Formulation: With patient Time For Goal Achievement: 12/26/15 Potential to Achieve Goals: Fair    Frequency Min 3X/week   Barriers to discharge        Co-evaluation               End of Session Equipment Utilized During Treatment: Oxygen Activity Tolerance: Patient limited by fatigue Patient left: in bed;with call bell/phone within reach;with bed alarm set           Time: 1500-1520 PT Time Calculation (min) (ACUTE ONLY): 20 min   Charges:   PT Evaluation $PT Eval Moderate Complexity: 1 Procedure     PT G CodesElray Mcgregor:        Cynthia Wynn 12/12/2015, 4:26 PM  Sheran Lawlessyndi Wynn, PT [phone] 12/12/2015

## 2015-12-12 NOTE — Clinical Social Work Note (Signed)
Clinical Social Work Assessment  Patient Details  Name: Katherine [name] MRN: [id number] Date of Birth: 10-May-1934  Date of referral:  12/12/15               Reason for consult:  Facility Placement                Permission sought to share information with:  Family Supports Permission granted to share information::  Yes, Verbal Permission Granted  Name::     [name]  [facility]  Relationship::  dtrs  Contact Information:     Housing/Transportation Living arrangements for the past 2 months:  [facility] Source of Information:  Patient Patient Interpreter Needed:  None Criminal Activity/Legal Involvement Pertinent to Current Situation/Hospitalization:  No - Comment as needed Significant Relationships:  Adult Children Lives with:  Facility Resident Do you feel safe going back to the place where you live?  Yes Need for family participation in patient care:  No (Coment)  Care giving concerns:  None- [facility]   Social Worker assessment / plan:  CSW spoke with pt in regards to plan at time of DC.  [facility].    Employment status:  Retired Health and safety inspectornsurance information:  Medicare PT Recommendations:  Not assessed at this time Information / Referral to community resources:     Patient/Family's Response to care:  Patient is agreeable to [place] [facility] at time of DC and her children are involved in ensure there will be no problems with her return  Patient/Family's Understanding of and Emotional Response to Diagnosis, Current Treatment, and Prognosis: Pt very clear and has good understanding of her current condition and care plan- no concerns at this time and is hopeful she can return to ALF soon.  Emotional Assessment Appearance:  Appears stated age Attitude/Demeanor/Rapport:    Affect  (typically observed):  Appropriate Orientation:  Oriented to Self, Oriented to Place, Oriented to  Time, Oriented to Situation Alcohol / Substance use:  Not Applicable Psych involvement (Current and /or in the community):  No (Comment)  Discharge Needs  Concerns to be addressed:  Care Coordination Readmission within the last 30 days:  No Current discharge risk:  Physical Impairment Barriers to Discharge:  Continued Medical Work up   Pe[employer]ody EnergyHoloman, Ashly Goethe M, LCSW 12/12/2015, 2:21 PM

## 2015-12-12 NOTE — Progress Notes (Signed)
On HD now diarrhea improved. Autoimmune liver tests still pending labs about the same.

## 2015-12-13 ENCOUNTER — Encounter: Payer: Medicare Other | Admitting: Vascular Surgery

## 2015-12-13 DIAGNOSIS — I950 Idiopathic hypotension: Secondary | ICD-10-CM

## 2015-12-13 DIAGNOSIS — R7989 Other specified abnormal findings of blood chemistry: Secondary | ICD-10-CM | POA: Insufficient documentation

## 2015-12-13 DIAGNOSIS — R945 Abnormal results of liver function studies: Secondary | ICD-10-CM

## 2015-12-13 LAB — HEPATIC FUNCTION PANEL
ALT: 284 U/L — ABNORMAL HIGH (ref 14–54)
AST: 732 U/L — AB (ref 15–41)
Albumin: 1.6 g/dL — ABNORMAL LOW (ref 3.5–5.0)
Alkaline Phosphatase: 1104 U/L — ABNORMAL HIGH (ref 38–126)
BILIRUBIN DIRECT: 6.7 mg/dL — AB (ref 0.1–0.5)
BILIRUBIN INDIRECT: 4.2 mg/dL — AB (ref 0.3–0.9)
BILIRUBIN TOTAL: 10.9 mg/dL — AB (ref 0.3–1.2)
Total Protein: 6.2 g/dL — ABNORMAL LOW (ref 6.5–8.1)

## 2015-12-13 LAB — BASIC METABOLIC PANEL
ANION GAP: 9 (ref 5–15)
BUN: 9 mg/dL (ref 6–20)
CHLORIDE: 99 mmol/L — AB (ref 101–111)
CO2: 27 mmol/L (ref 22–32)
Calcium: 9 mg/dL (ref 8.9–10.3)
Creatinine, Ser: 3.23 mg/dL — ABNORMAL HIGH (ref 0.44–1.00)
GFR calc Af Amer: 15 mL/min — ABNORMAL LOW (ref >60)
GFR, EST NON AFRICAN AMERICAN: 13 mL/min — AB (ref >60)
Glucose, Bld: 89 mg/dL (ref 65–99)
POTASSIUM: 3.8 mmol/L (ref 3.5–5.1)
SODIUM: 135 mmol/L (ref 135–145)

## 2015-12-13 LAB — CBC
HCT: 28.9 % — ABNORMAL LOW (ref 36.0–46.0)
HEMOGLOBIN: 9.2 g/dL — AB (ref 12.0–15.0)
MCH: 25.3 pg — AB (ref 26.0–34.0)
MCHC: 31.8 g/dL (ref 30.0–36.0)
MCV: 79.6 fL (ref 78.0–100.0)
PLATELETS: 158 10*3/uL (ref 150–400)
RBC: 3.63 MIL/uL — AB (ref 3.87–5.11)
RDW: 19.2 % — ABNORMAL HIGH (ref 11.5–15.5)
WBC: 9 10*3/uL (ref 4.0–10.5)

## 2015-12-13 LAB — GLUCOSE, CAPILLARY
GLUCOSE-CAPILLARY: 143 mg/dL — AB (ref 65–99)
GLUCOSE-CAPILLARY: 85 mg/dL (ref 65–99)
GLUCOSE-CAPILLARY: 103 mg/dL — AB (ref 65–99)
GLUCOSE-CAPILLARY: 120 mg/dL — AB (ref 65–99)
GLUCOSE-CAPILLARY: 95 mg/dL (ref 65–99)
Glucose-Capillary: 181 mg/dL — ABNORMAL HIGH (ref 65–99)
Glucose-Capillary: 192 mg/dL — ABNORMAL HIGH (ref 65–99)

## 2015-12-13 LAB — FANA STAINING PATTERNS

## 2015-12-13 LAB — ANTINUCLEAR ANTIBODIES, IFA: ANTINUCLEAR ANTIBODIES, IFA: POSITIVE — AB

## 2015-12-13 MED ORDER — SENNOSIDES-DOCUSATE SODIUM 8.6-50 MG PO TABS
1.0000 | ORAL_TABLET | Freq: Two times a day (BID) | ORAL | Status: DC
  Administered 2015-12-13 – 2015-12-17 (×6): 1 via ORAL
  Filled 2015-12-13 (×7): qty 1

## 2015-12-13 NOTE — Progress Notes (Signed)
[facility] Progress Note  Assessment/Plan: 1. Jaundice/ ^LFT's - no biliary obstruction on [place]. GI evaluating/LFT/ANA/SPEP pending Total bili 10.9 other LFTs slight trend down 2. ESRD HD tts - K 3.8  3 Chron hypotension/volume on midodrine- her BP is much lower here than outpt center - pre and post BPs run 120 - 140 +/- range- transferred to 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 Abnormal troponin - per cards  Sheffield Slider, PA-C  [facility] [phone] 12/13/2015,12:21 PM  LOS: 4 days   Pt seen, examined and agree w A/P as above.  Vinson Moselle MD [facility] pager [phone]    cell [phone] 12/13/2015, 3:57 PM    Subjective:   Nauseated. No appetite. Eating little. Feels bad.  Objective Filed Vitals:   12/13/15 0900 12/13/15 1000 12/13/15 1100 12/13/15 1147  BP:  94/55  95/57  Pulse: 89 88 83 85  Temp:    98.7 F (37.1 C)  TempSrc:    Oral  Resp: 14 14 14 15   Weight:      SpO2: 96% 96% 95% 96%   Physical Exam General: weak  Heart: RRR with ectopy Lungs: grossly clear anteriorly Abdomen: obese soft NT Extremities: left shin hematoma- heating pad on  leg Dialysis Access: weak left upper AVF maturing and right IJ  Dialysis Orders: TTS NW 4h 108kg 2/2.25 bath Hep 6000 R IJ cath (maturing L AVF) hect 2 ug venofer 50 Last hb 11.1  Additional Objective Labs: Basic Metabolic Panel:  Recent Labs Lab 12/11/15 0614 12/12/15 0644 12/13/15 0517  NA 133* 134* 135  K 3.8 4.0 3.8  CL 96* 97* 99*  CO2 28 27 27   GLUCOSE 77 88 89  BUN 16 26* 9  CREATININE 4.78* 6.22* 3.23*  CALCIUM 9.0 8.8* 9.0   Liver Function Tests:  Recent Labs Lab 12/11/15 0614 12/12/15 0644 12/13/15 1044  AST 789* 762* 732*  ALT 286* 288* 284*  ALKPHOS 1062* 1087* 1104*  BILITOT 9.2* 10.1* 10.9*  PROT 5.9* 5.9* 6.2*  ALBUMIN 1.6* 1.5* 1.6*    Recent Labs Lab 12/05/2015 1111  LIPASE 141*   CBC:  Recent Labs Lab 12/08/2015 1111 12/10/15 0258 12/11/15 0614 12/12/15 0644 12/13/15 0517  WBC 7.3 8.0 7.3 8.1 9.0  HGB 11.7* 10.1* 9.7* 9.2* 9.2*  HCT 36.8 31.9* 30.8* 29.9* 28.9*  MCV 78.1 78.8 79.6 79.9 79.6  PLT 169 152 152 161 158   Blood Culture    Component Value Date/Time   SDES BLOOD RIGHT HAND 12/06/2015 1335  SPECREQUEST IN PEDIATRIC BOTTLE  4CC 12/12/2015 1335   CULT NO GROWTH 3 DAYS 12/02/2015 1335   REPTSTATUS PENDING 01/01/2016 1335    Cardiac Enzymes:  Recent Labs Lab 12/03/2015 1111  12/10/15 0258 12/10/15 1255 12/11/15 1859 12/12/15 0020 12/12/15 0644  CKTOTAL 372*  --   --   --   --   --   --   TROPONINI 0.22*  < > 0.20* 0.26* 0.28* 0.28* 0.27*  < > = values in this interval not displayed. CBG:  Recent Labs Lab 12/12/15 1945 12/12/15 2327 12/13/15 0438 12/13/15 0752 12/13/15 1029  GLUCAP 131* 143* 85 103* 120*    Lab Results  Component Value Date   INR 1.47 12/11/2015   INR 1.60* 12/10/2015   INR 1.37 12/19/2015   Studies/Results: Dg Chest 1 View  12/11/2015  CLINICAL DATA:  Dyspnea. EXAM: CHEST 1 VIEW COMPARISON:  12/09/2014 FINDINGS: Dual lumen right central venous catheter is stable.  Cardiomediastinal silhouette is mildly enlarged. Mediastinal contours appear intact. There is no evidence of pneumothorax. There are low lung volumes with prominence of the interstitial markings which may represent interstitial pulmonary edema. Streaky airspace opacities is seen in the left lower lobe, with focal airspace consolidation can't be excluded. There probable bilateral subpulmonic effusions. Osseous structures are without acute abnormality. Soft tissues are grossly normal. IMPRESSION: Low lung volumes with interstitial pulmonary edema and probable bilateral small subpulmonic effusions. More focal airspace consolidation in the left lower lobe cannot be excluded. Electronically Signed   By: Ted Mcalpine M.D.   On: 12/11/2015 22:54   Ct Tibia Fibula Left Wo Contrast  12/12/2015  CLINICAL DATA:  Focal painful swelling on the left lower leg over the last several days. EXAM: CT TIBIA FIBULA LEFT WITHOUT CONTRAST TECHNIQUE: Multidetector CT imaging was performed according to the standard protocol. Multiplanar CT image reconstructions were also generated. COMPARISON:  None. FINDINGS: A total knee prosthesis is partially visualized. The tibial component demonstrates no findings of complicating feature. Overall no significant bony abnormality is observed aside from some degenerative midfoot findings. Plantar calcaneal spur is also present. In the region of concern along the medial portion of the lower leg, there is a dominant 8.6 by 3.5 by 3.0 cm (volume = 47 cc) subcutaneous lesion with some non dependent simple appearing component and a higher density deep in a component. There surrounding stranding in the subcutaneous tissues as well as some mild high-density lobularity adjacent to this dominant lesion most notably in the vicinity of image 25 series 4 along the superior margin. The high density and overall appearance favor a subcutaneous hematoma, with thrombosis of a superficial venous varix is a  differential diagnostic consideration but considered less likely given the overall size of the lesion. There is some inflammatory stranding tracking in the subcutaneous tissues anterior to the tibia and along the anteromedial tibial margin. No extension along the deep fascia planes or muscular tissues. Along the posterolateral mid calf there is some slight nodularity including a 1.0 cm nodule, likely also due to a much smaller hematoma or venous varix. IMPRESSION: 1. 47 cc in volume subcutaneous lesion with complex internal fluid density along the medial mid calf, probably a subcutaneous hematoma, less likely to be a thrombosed venous varix. Surrounding inflammatory stranding noted tracking along the superficial fascia margin of the adjacent medial calf and tibia. Given the high density in overall apparent size consider an infectious process such as abscess much less likely. 2. No complicating feature along the tibial tray  component of the knee prosthesis. 3. We incidentally see degenerative midfoot arthropathy on the left. Electronically Signed   By: Gaylyn RongWalter  Liebkemann M.D.   On: 12/12/2015 16:56   Medications:   . calcium acetate  667 mg Oral TID WC  . darbepoetin (ARANESP) injection - DIALYSIS  60 mcg Intravenous Q Tue-HD  . doxercalciferol  2 mcg Intravenous Q T,Th,Sa-HD  . doxycycline  100 mg Oral Q12H  . feeding supplement (NEPRO CARB STEADY)  237 mL Oral BID BM  . heparin  5,000 Units Subcutaneous Q8H  . insulin aspart  0-9 Units Subcutaneous Q4H  . midodrine  10 mg Oral Q T,Th,Sa-HD  . multivitamin  1 tablet Oral Daily  . pantoprazole  40 mg Oral Daily  . sodium chloride flush  3 mL Intravenous Q12H

## 2015-12-13 NOTE — Progress Notes (Signed)
CSW spoke with patient regarding recommendation of SNF. She stated she is not sure if she wants to go to SNF or return to ALF since she just got there.  Katherine [name] LCSWA [phone]

## 2015-12-13 NOTE — Progress Notes (Signed)
Patient had about a 9 beat run SVT nonsustained, was asymptomatic.  Attending has been made aware. Will continue to monitor patient

## 2015-12-13 NOTE — Progress Notes (Signed)
[facility] PROGRESS NOTE Subjective patient feels okay denies abdominal pain. Her blood pressure remains intermittently low was down in the 70's  systolic yesterday. Has been running in the70's-90's today.  Objective: Vital signs in last 24 hours: Temp:  [98.7 F (37.1 C)-99.3 F (37.4 C)] 98.7 F (37.1 C) (07/12 1147) Pulse Rate:  [79-93] 82 (07/12 1400) Resp:  [12-20] 16 (07/12 1400) BP: (78-103)/(39-57) 90/51 mmHg (07/12 1200) SpO2:  [91 %-99 %] 98 % (07/12 1400) Weight:  [109 kg (240 lb 4.8 oz)] 109 kg (240 lb 4.8 oz) (07/12 0500) Last BM Date: 12/12/15  Intake/Output from previous day: 07/11 0701 - 07/12 0700 In: 490 [P.O.:490] Out: 851 [Urine:1] Intake/Output this shift: Total I/O In: 240 [P.O.:240] Out: -   PE: General-- no acute distress  Abdomen-- palpable midline scar, nontender  Lab Results:  Recent Labs  12/11/15 0614 12/12/15 0644 12/13/15 0517  WBC 7.3 8.1 9.0  HGB 9.7* 9.2* 9.2*  HCT 30.8* 29.9* 28.9*  PLT 152 161 158   BMET  Recent Labs  12/11/15 0614 12/12/15 0644 12/13/15 0517  NA 133* 134* 135  K 3.8 4.0 3.8  CL 96* 97* 99*  CO2 28 27 27   CREATININE 4.78* 6.22* 3.23*   LFT  Recent Labs  12/11/15 0614 12/12/15 0644 12/13/15 1044  PROT 5.9* 5.9* 6.2*  AST 789* 762* 732*  ALT 286* 288* 284*  ALKPHOS 1062* 1087* 1104*  BILITOT 9.2* 10.1* 10.9*  BILIDIR  --   --  6.7*  IBILI  --   --  4.2*   PT/INR  Recent Labs  12/11/15 0614  LABPROT 17.9*  INR 1.47   PANCREAS No results for input(s): LIPASE in the last 72 hours.       Studies/Results: Dg Chest 1 View  12/11/2015  CLINICAL DATA:  Dyspnea. EXAM: CHEST 1 VIEW COMPARISON:  12/09/2014 FINDINGS: Dual lumen right central venous catheter is stable. Cardiomediastinal silhouette is mildly enlarged. Mediastinal contours appear intact. There is no evidence of pneumothorax. There are low lung volumes with prominence of the interstitial markings which may represent  interstitial pulmonary edema. Streaky airspace opacities is seen in the left lower lobe, with focal airspace consolidation can't be excluded. There probable bilateral subpulmonic effusions. Osseous structures are without acute abnormality. Soft tissues are grossly normal. IMPRESSION: Low lung volumes with interstitial pulmonary edema and probable bilateral small subpulmonic effusions. More focal airspace consolidation in the left lower lobe cannot be excluded. Electronically Signed   By: Ted Mcalpineobrinka  Dimitrova M.D.   On: 12/11/2015 22:54   Ct Tibia Fibula Left Wo Contrast  12/12/2015  CLINICAL DATA:  Focal painful swelling on the left lower leg over the last several days. EXAM: CT TIBIA FIBULA LEFT WITHOUT CONTRAST TECHNIQUE: Multidetector CT imaging was performed according to the standard protocol. Multiplanar CT image reconstructions were also generated. COMPARISON:  None. FINDINGS: A total knee prosthesis is partially visualized. The tibial component demonstrates no findings of complicating feature. Overall no significant bony abnormality is observed aside from some degenerative midfoot findings. Plantar calcaneal spur is also present. In the region of concern along the medial portion of the lower leg, there is a dominant 8.6 by 3.5 by 3.0 cm (volume = 47 cc) subcutaneous lesion with some non dependent simple appearing component and a higher density deep in a component. There surrounding stranding in the subcutaneous tissues as well as some mild high-density lobularity adjacent to this dominant lesion most notably in the vicinity of image 25 series 4  along the superior margin. The high density and overall appearance favor a subcutaneous hematoma, with thrombosis of a superficial venous varix is a differential diagnostic consideration but considered less likely given the overall size of the lesion. There is some inflammatory stranding tracking in the subcutaneous tissues anterior to the tibia and along the  anteromedial tibial margin. No extension along the deep fascia planes or muscular tissues. Along the posterolateral mid calf there is some slight nodularity including a 1.0 cm nodule, likely also due to a much smaller hematoma or venous varix. IMPRESSION: 1. 47 cc in volume subcutaneous lesion with complex internal fluid density along the medial mid calf, probably a subcutaneous hematoma, less likely to be a thrombosed venous varix. Surrounding inflammatory stranding noted tracking along the superficial fascia margin of the adjacent medial calf and tibia. Given the high density in overall apparent size consider an infectious process such as abscess much less likely. 2. No complicating feature along the tibial tray component of the knee prosthesis. 3. We incidentally see degenerative midfoot arthropathy on the left. Electronically Signed   By: Gaylyn Rong M.D.   On: 12/12/2015 16:56    Medications: I have reviewed the patient's current medications.  Assessment/Plan: 1. Abnormal liver test. AMA positive anti-mitochondrial antibody normal and smooth muscle antibody basically normal as well. Protein electrophoresis does not show any gamma globulin spike. Don't feel that she likely has autoimmune liver disease. She has a cholestatic pattern but ultrasound is not show any dilated ducts only fatty liver. There are obvious cardiovascular issues one on in this could all be due to chronic shock liver.  If okay with other physicians I think we should go ahead and obtain a CT of the abdomen with IV contrast. This is to evaluate for intrahepatic masses etc. Should be okay to do this on Friday after dialysis tomorrow  Tabbetha Kutscher JR,Konica Stankowski L 12/13/2015, 3:29 PM  This note was created using voice recognition software. Minor errors may Have occurred unintentionally.  Pager: [phone] If no answer or after hours call [phone]

## 2015-12-13 NOTE — Progress Notes (Signed)
Physical Therapy Treatment Patient Details Name: Katherine LauthClara P Pigeon MRN: [id number] DOB: [birth year] Today's Date: 12/13/2015    History of Present Illness Bernadette Virgina Jock Hanford is a 82 y.o. female with medical history significant for solitary right kidney and atrophic left kidney currently on hemodialysis (TTS), diabetes on insulin, hypertension, left ventricular diastolic dysfunction, dyslipidemia, morbid obesity and physical deconditioning. She presented to the ED on 12/15/2015 for a 1-2 week history of diarrhea, 4-5 stools per day.    PT Comments    Patient limited due to nausea this session, but participated in bed exercises.  Feel she is a good candidate for post acute inpatient rehab stay at SNF prior to return to ALF.  Continue skilled PT during acute stay until d/c.  Follow Up Recommendations  SNF     Equipment Recommendations  None recommended by PT    Recommendations for Other Services       Precautions / Restrictions Precautions Precautions: Fall    Mobility  Bed Mobility               General bed mobility comments: NT due to patient with nausea and occasional heaving, just s/p nausea medication  Transfers                    Ambulation/Gait                 Stairs            Wheelchair Mobility    Modified Rankin (Stroke Patients Only)       Balance                                    Cognition Arousal/Alertness: Awake/alert Behavior During Therapy: WFL for tasks assessed/performed Overall Cognitive Status: Within Functional Limits for tasks assessed                      Exercises General Exercises - Upper Extremity Shoulder Flexion: AAROM;Both;10 reps;Supine Elbow Flexion: AROM;Both;10 reps;Supine General Exercises - Lower Extremity Ankle Circles/Pumps: AROM;Both;10 reps;Supine Long Arc Quad: AROM;Both;10 reps;Supine Heel Slides: AROM;AAROM;Both;Supine;10 reps Hip ABduction/ADduction: AAROM;Both;10  reps;Supine Low Level/ICU Exercises Stabilized Bridging: Strengthening;Both;10 reps;Supine Shoulder Press: AROM;Both;10 reps;Supine Other Exercises Other Exercises: lower trunk rotation x 10 AAROM both legs supine    General Comments        Pertinent Vitals/Pain Faces Pain Scale: Hurts little more Pain Location: L LE Pain Descriptors / Indicators: Sore Pain Intervention(s): Monitored during session;Repositioned;Heat applied    Home Living                      Prior Function            PT Goals (current goals can now be found in the care plan section) Progress towards PT goals: Progressing toward goals    Frequency  Min 3X/week    PT Plan Current plan remains appropriate    Co-evaluation             End of Session   Activity Tolerance: Treatment limited secondary to medical complications (Comment) (N&V) Patient left: with call bell/phone within reach;in bed     Time: 1220-1240 PT Time Calculation (min) (ACUTE ONLY): 20 min  Charges:  $Therapeutic Exercise: 8-22 mins                    G Codes:  Elray Mcgregor 12/13/2015, 5:11 PM  Sheran Lawless, [place] [phone] 12/13/2015

## 2015-12-13 NOTE — Progress Notes (Signed)
Patient Name: Katherine LauthClara P Ponder Date of Encounter: 12/13/2015  Principal Problem:   Obstructive jaundice Active Problems:   HTN (hypertension)   Dyslipidemia   Morbid obesity (HCC)   ESRD on dialysis (HCC)   Left calf hematoma   Diabetes mellitus, insulin dependent (IDDM), uncontrolled (HCC)   Acute hyperkalemia   Elevated troponin   Physical deconditioning   Left ventricular diastolic dysfunction, NYHA class 1   Autonomic dysfunction   ESRD (end stage renal disease) (HCC)   Diarrhea   Dyspnea   Arterial hypotension   Primary Cardiologist: Dr. Duke Salviaandolph Patient Profile: 82 y/o female with ESRD on HD, HTN, HLD, T2DM and prior cholecystectomy who presented with nausea, diarrhea and dyspnea. Found to be hypotensive and with elevated liver enzymes. Cardiology consulted for elevated troponin levels  SUBJECTIVE: Feels weak, has dry heaving. Denies chest pain and SOB.    OBJECTIVE Filed Vitals:   12/12/15 2329 12/13/15 0440 12/13/15 0500 12/13/15 0754  BP: 91/45 103/51  103/53  Pulse:    93  Temp: 99.2 F (37.3 C) 98.8 F (37.1 C)  99 F (37.2 C)  TempSrc: Oral Oral  Oral  Resp: 19 13  17   Weight:   240 lb 4.8 oz (109 kg)   SpO2: 97% 96%  96%    Intake/Output Summary (Last 24 hours) at 12/13/15 0845 Last data filed at 12/13/15 0441  Gross per 24 hour  Intake    490 ml  Output    851 ml  Net   -361 ml   Filed Weights   12/12/15 0700 12/12/15 1106 12/13/15 0500  Weight: 238 lb 5.1 oz (108.1 kg) 235 lb 10.8 oz (106.9 kg) 240 lb 4.8 oz (109 kg)    PHYSICAL EXAM General: Well developed, well nourished, female in no acute distress. Head: Normocephalic, atraumatic.  Neck: Supple without bruits, no JVD. Lungs:  Resp regular and unlabored, CTA. Heart: RRR, S1, S2, no S3, S4, or murmur; no rub. Abdomen: Soft, non-tender, non-distended, BS + x 4.  Extremities: No clubbing, cyanosis, no edema.  Neuro: Alert and oriented X 3. Moves all extremities spontaneously. Psych:  Normal affect.  LABS: CBC: Recent Labs  12/12/15 0644 12/13/15 0517  WBC 8.1 9.0  HGB 9.2* 9.2*  HCT 29.9* 28.9*  MCV 79.9 79.6  PLT 161 158   INR: Recent Labs  12/11/15 0614  INR 1.47   Basic Metabolic Panel: Recent Labs  12/12/15 0644 12/13/15 0517  NA 134* 135  K 4.0 3.8  CL 97* 99*  CO2 27 27  GLUCOSE 88 89  BUN 26* 9  CREATININE 6.22* 3.23*  CALCIUM 8.8* 9.0   Liver Function Tests: Recent Labs  12/11/15 0614 12/12/15 0644  AST 789* 762*  ALT 286* 288*  ALKPHOS 1062* 1087*  BILITOT 9.2* 10.1*  PROT 5.9* 5.9*  ALBUMIN 1.6* 1.5*   Cardiac Enzymes: Recent Labs  12/11/15 1859 12/12/15 0020 12/12/15 0644  TROPONINI 0.28* 0.28* 0.27*   BNP:  B NATRIURETIC PEPTIDE  Date/Time Value Ref Range Status  09/25/2015 12:23 PM 381.4* 0.0 - 100.0 pg/mL Final     Current facility-administered medications:  .  calcium acetate (PHOSLO) capsule 667 mg, 667 mg, Oral, TID WC, Russella DarAllison L Ellis, NP, 667 mg at 12/12/15 1831 .  Darbepoetin Alfa (ARANESP) injection 60 mcg, 60 mcg, Intravenous, Q Tue-HD, Lemke SettleMartha Bergman, PA-C, 60 mcg at 12/12/15 0755 .  doxercalciferol (HECTOROL) injection 2 mcg, 2 mcg, Intravenous, Q T,Th,Sa-HD, Blankley SettleMartha Bergman, PA-C, 2 mcg  at 12/12/15 0754 .  doxycycline (VIBRA-TABS) tablet 100 mg, 100 mg, Oral, Q12H, Dorothea Ogle, MD, 100 mg at 12/12/15 2110 .  feeding supplement (NEPRO CARB STEADY) liquid 237 mL, 237 mL, Oral, BID BM, Kohls Settle, PA-C, 237 mL at 12/11/15 1130 .  heparin injection 5,000 Units, 5,000 Units, Subcutaneous, Q8H, Russella Dar, NP, 5,000 Units at 12/13/15 0631 .  insulin aspart (novoLOG) injection 0-9 Units, 0-9 Units, Subcutaneous, Q4H, Russella Dar, NP, 1 Units at 12/13/15 0005 .  midodrine (PROAMATINE) tablet 10 mg, 10 mg, Oral, Q T,Th,Sa-HD, Russella Dar, NP, 10 mg at 12/12/15 0751 .  multivitamin (RENA-VIT) tablet 1 tablet, 1 tablet, Oral, Daily, Russella Dar, NP, 1 tablet at 12/12/15 1323 .  ondansetron  (ZOFRAN) tablet 4 mg, 4 mg, Oral, Q6H PRN **OR** ondansetron (ZOFRAN) injection 4 mg, 4 mg, Intravenous, Q6H PRN, Russella Dar, NP, 4 mg at 12/13/15 0010 .  pantoprazole (PROTONIX) EC tablet 40 mg, 40 mg, Oral, Daily, Russella Dar, NP, 40 mg at 12/12/15 1323 .  sodium chloride flush (NS) 0.9 % injection 3 mL, 3 mL, Intravenous, Q12H, Russella Dar, NP, 3 mL at 12/12/15 1341      TELE:  NSR        Radiology/Studies: Dg Chest 1 View  12/11/2015  CLINICAL DATA:  Dyspnea. EXAM: CHEST 1 VIEW COMPARISON:  12/09/2014 FINDINGS: Dual lumen right central venous catheter is stable. Cardiomediastinal silhouette is mildly enlarged. Mediastinal contours appear intact. There is no evidence of pneumothorax. There are low lung volumes with prominence of the interstitial markings which may represent interstitial pulmonary edema. Streaky airspace opacities is seen in the left lower lobe, with focal airspace consolidation can't be excluded. There probable bilateral subpulmonic effusions. Osseous structures are without acute abnormality. Soft tissues are grossly normal. IMPRESSION: Low lung volumes with interstitial pulmonary edema and probable bilateral small subpulmonic effusions. More focal airspace consolidation in the left lower lobe cannot be excluded. Electronically Signed   By: Ted Mcalpine M.D.   On: 12/11/2015 22:54   Ct Tibia Fibula Left Wo Contrast  12/12/2015  CLINICAL DATA:  Focal painful swelling on the left lower leg over the last several days. EXAM: CT TIBIA FIBULA LEFT WITHOUT CONTRAST TECHNIQUE: Multidetector CT imaging was performed according to the standard protocol. Multiplanar CT image reconstructions were also generated. COMPARISON:  None. FINDINGS: A total knee prosthesis is partially visualized. The tibial component demonstrates no findings of complicating feature. Overall no significant bony abnormality is observed aside from some degenerative midfoot findings. Plantar calcaneal  spur is also present. In the region of concern along the medial portion of the lower leg, there is a dominant 8.6 by 3.5 by 3.0 cm (volume = 47 cc) subcutaneous lesion with some non dependent simple appearing component and a higher density deep in a component. There surrounding stranding in the subcutaneous tissues as well as some mild high-density lobularity adjacent to this dominant lesion most notably in the vicinity of image 25 series 4 along the superior margin. The high density and overall appearance favor a subcutaneous hematoma, with thrombosis of a superficial venous varix is a differential diagnostic consideration but considered less likely given the overall size of the lesion. There is some inflammatory stranding tracking in the subcutaneous tissues anterior to the tibia and along the anteromedial tibial margin. No extension along the deep fascia planes or muscular tissues. Along the posterolateral mid calf there is some slight nodularity including a 1.0 cm nodule,  likely also due to a much smaller hematoma or venous varix. IMPRESSION: 1. 47 cc in volume subcutaneous lesion with complex internal fluid density along the medial mid calf, probably a subcutaneous hematoma, less likely to be a thrombosed venous varix. Surrounding inflammatory stranding noted tracking along the superficial fascia margin of the adjacent medial calf and tibia. Given the high density in overall apparent size consider an infectious process such as abscess much less likely. 2. No complicating feature along the tibial tray component of the knee prosthesis. 3. We incidentally see degenerative midfoot arthropathy on the left. Electronically Signed   By: Gaylyn Rong M.D.   On: 12/12/2015 16:56     Current Medications:  . calcium acetate  667 mg Oral TID WC  . darbepoetin (ARANESP) injection - DIALYSIS  60 mcg Intravenous Q Tue-HD  . doxercalciferol  2 mcg Intravenous Q T,Th,Sa-HD  . doxycycline  100 mg Oral Q12H  .  feeding supplement (NEPRO CARB STEADY)  237 mL Oral BID BM  . heparin  5,000 Units Subcutaneous Q8H  . insulin aspart  0-9 Units Subcutaneous Q4H  . midodrine  10 mg Oral Q T,Th,Sa-HD  . multivitamin  1 tablet Oral Daily  . pantoprazole  40 mg Oral Daily  . sodium chloride flush  3 mL Intravenous Q12H      ASSESSMENT AND PLAN: Principal Problem:   Obstructive jaundice Active Problems:   HTN (hypertension)   Dyslipidemia   Morbid obesity (HCC)   ESRD on dialysis (HCC)   Left calf hematoma   Diabetes mellitus, insulin dependent (IDDM), uncontrolled (HCC)   Acute hyperkalemia   Elevated troponin   Physical deconditioning   Left ventricular diastolic dysfunction, NYHA class 1   Autonomic dysfunction   ESRD (end stage renal disease) (HCC)   Diarrhea   Dyspnea   Arterial hypotension  1. Elevated Liver Enzymes: admission CMP showed elevated AST of 806, ALT of 322 and alkaline phosphatase of 1247. Levels are now down trending. She was not previously on a statin. An abdominal ultrasound was obtained which showed significant hepatic steatosis, status post cholecystectomy, 8 mm common bile duct consistent with prior cholecystectomy state but no intrahepatic biliary ductal dilatation, no space-occupying lesions of the liver. GI has been consulted and following. Possible etiologies felt to be resolving "shock liver," reactive hepatopathy vsLexapro side effect. Acetaminophen level is undetectable. ANA and autoimmune markers pending. Further management per GI.   2. Abnormal Troponin: Enzyme trend: 0.20>>0.26>>0.28>.0.26>>0.26. She denies any recent CP. No exertional CP with ADLs prior to admit. Her last 2D echo was prior to this admit, 09/25/15, which showed normal LVEF of 55-60%, G1DD and normal wall motion. Recommend repeating 2D echo to assess for any changes in LVF or wall motion, this is pending.   3. ESRD: on HD. Nephrology following           Signed, Little Ishikawa , NP 8:45  AM 12/13/2015 Pager [phone]

## 2015-12-13 NOTE — Progress Notes (Signed)
Patient ID: Katherine [name], female   DOB: 1934-03-24, 82 y.o.   MRN: [id number]   PROGRESS NOTE    Katherine [name]  WUJ:[id number] DOB: 11-30-1933 DOA: 12-23-15  PCP: [name], MD   Brief Narrative:  82 y.o. Female with ESRD on HD, DM type II, resident of [facility] and in process of moving to ALF, presented to [facility] for evaluation of 1-2 weeks duration of diarrhea and had blood work notable for elevated liver enzymes, mixed cholestatic and hepatocellular pattern. Bilirubin is 10, alkaline phosphatase 1200, transaminases in the 300-800 range. Platelets are normal at 169,000, and INR is 1.37. For comparison, 2 months ago the patient's liver chemistries were completely normal and her INR at that time was 1.03.  An abdominal ultrasound notable for significant hepatic steatosis, status post cholecystectomy, 8 mm common bile duct consistent with prior cholecystectomy state but no intrahepatic biliary ductal dilatation, no space-occupying lesions of the liver.  Assessment & Plan:   Principal Problem: New-onset elevation of liver enzymes with jaundice - mixed hepatocellular/cholestatic pattern, without evidence of anatomic biliary obstruction or focal hepatic parenchymal lesions - LFT's better since admission but unchanged in the past 48 hours, pending this AM - autoimmune work up pending so far - appreciate GI team following  - continue with conservative management for now   Active Problems:   Hypotension 7/10 - requiring transfer to SDU - pt at baseline hypotensive and on midodrine, will continue  - PCCM consulted and has signed off as pt has stabilized  - ok to transfer to tele unit today     Elevated troponins - in the setting of hypotension  - cardiology consulted, no need for cardiac intervention at this time - ECHO pending     Morbid obesity (HCC) - Body mass index is 41.23 kg/(m^2).     ESRD on dialysis [facility]) - per nephrology, appreciate assistance     Diarrhea -  resolved, contact precautions d/c    Left calf hematoma - after trauma to the area - confirmed with CT scan, will ask ortho to look at this and see if can be drained  - continue doxy day #2    Diabetes mellitus, insulin dependent (IDDM), with complications of nephropathy  - reasonable inpatient control  - keep on SSI for now    Acute hyperkalemia - resolved     Physical deconditioning - PT eval requested  - SNF recommended, work in progress     Left ventricular diastolic dysfunction, NYHA class 1 - monitor weights  - stable 235 - 238 lbs   DVT prophylaxis: Heparin SQ Code Status: Full  Family Communication: Patient at bedside  Disposition Plan: SNF once cleared from GI stand point   Consultants:   GI  Nephrology   Cardiology   Procedures:   None  Antimicrobials:   Doxycycline 7/11 -->  Subjective: No events overnight.   Objective: Filed Vitals:   12/12/15 2329 12/13/15 0440 12/13/15 0500 12/13/15 0754  BP: 91/45 103/51  103/53  Pulse:    93  Temp: 99.2 F (37.3 C) 98.8 F (37.1 C)  99 F (37.2 C)  TempSrc: Oral Oral  Oral  Resp: 19 13  17   Weight:   109 kg (240 lb 4.8 oz)   SpO2: 97% 96%  96%    Intake/Output Summary (Last 24 hours) at 12/13/15 1024 Last data filed at 12/13/15 0441  Gross per 24 hour  Intake    490 ml  Output  851 ml  Net   -361 ml   Filed Weights   12/12/15 0700 12/12/15 1106 12/13/15 0500  Weight: 108.1 kg (238 lb 5.1 oz) 106.9 kg (235 lb 10.8 oz) 109 kg (240 lb 4.8 oz)    Examination:  General exam: Appears calm and comfortable, jaundiced sclera  Respiratory system: Respiratory effort normal. Diminished air movement at bases Cardiovascular system: S1 & S2 heard, RRR. No rubs, gallops or clicks. No pedal edema. Gastrointestinal system: Abdomen is nondistended, soft and nontender. No organomegaly or masses felt.  Central nervous system: Alert and oriented. No focal neurological deficits. Extremities: Symmetric 5 x 5  power. Left anterior shin area hematoma, slightly TTP and with surrounding erythema, warmth to touch  Psychiatry: Judgement and insight appear normal. Mood & affect appropriate.   Data Reviewed: I have personally reviewed following labs and imaging studies  CBC:  Recent Labs Lab 12/17/2015 1111 12/10/15 0258 12/11/15 0614 12/12/15 0644 12/13/15 0517  WBC 7.3 8.0 7.3 8.1 9.0  HGB 11.7* 10.1* 9.7* 9.2* 9.2*  HCT 36.8 31.9* 30.8* 29.9* 28.9*  MCV 78.1 78.8 79.6 79.9 79.6  PLT 169 152 152 161 158   Basic Metabolic Panel:  Recent Labs Lab 12/31/2015 1111 12/10/15 0258 12/11/15 0614 12/12/15 0644 12/13/15 0517  NA 132* 131* 133* 134* 135  K 5.5* 3.8 3.8 4.0 3.8  CL 94* 96* 96* 97* 99*  CO2 23 24 28 27 27   GLUCOSE 78 73 77 88 89  BUN 17 7 16  26* 9  CREATININE 5.31* 2.88* 4.78* 6.22* 3.23*  CALCIUM 9.1 8.0* 9.0 8.8* 9.0   Liver Function Tests:  Recent Labs Lab 12/08/15 0845 12/25/2015 1111 12/10/15 0258 12/11/15 0614 12/12/15 0644  AST 806* 835* 772* 789* 762*  ALT 322* 320* 290* 286* 288*  ALKPHOS 1247* 1212* 1058* 1062* 1087*  BILITOT 10.2* 10.2* 8.3* 9.2* 10.1*  PROT 6.9 6.9 6.0* 5.9* 5.9*  ALBUMIN 2.0* 1.9* 1.7* 1.6* 1.5*    Recent Labs Lab 12/25/2015 1111  LIPASE 141*    Recent Labs Lab 12/05/2015 1111  AMMONIA 72*   Coagulation Profile:  Recent Labs Lab 12/02/2015 1111 12/10/15 0258 12/11/15 0614  INR 1.37 1.60* 1.47   Cardiac Enzymes:  Recent Labs Lab 12/26/2015 1111  12/10/15 0258 12/10/15 1255 12/11/15 1859 12/12/15 0020 12/12/15 0644  CKTOTAL 372*  --   --   --   --   --   --   TROPONINI 0.22*  < > 0.20* 0.26* 0.28* 0.28* 0.27*  < > = values in this interval not displayed. CBG:  Recent Labs Lab 12/12/15 1616 12/12/15 1945 12/12/15 2327 12/13/15 0438 12/13/15 0752  GLUCAP 134* 131* 143* 85 103*   Urine analysis:    Component Value Date/Time   COLORURINE AMBER* 10/05/2015 1229   APPEARANCEUR TURBID* 10/05/2015 1229   LABSPEC  1.020 10/05/2015 1229   PHURINE 5.0 10/05/2015 1229   GLUCOSEU NEGATIVE 10/05/2015 1229   HGBUR LARGE* 10/05/2015 1229   BILIRUBINUR LARGE* 10/05/2015 1229   KETONESUR 15* 10/05/2015 1229   PROTEINUR >300* 10/05/2015 1229   UROBILINOGEN 1.0 02/11/2014 0913   NITRITE NEGATIVE 10/05/2015 1229   LEUKOCYTESUR LARGE* 10/05/2015 1229   Recent Results (from the past 240 hour(s))  Culture, blood (Routine X 2) w Reflex to ID Panel     Status: None (Preliminary result)   Collection Time: 01/01/2016  1:30 PM  Result Value Ref Range Status   Specimen Description BLOOD RIGHT ANTECUBITAL  Final   Special Requests  Final    BOTTLES DRAWN AEROBIC AND ANAEROBIC  10CC AER 5CC ANA   Culture NO GROWTH 3 DAYS  Final   Report Status PENDING  Incomplete  Culture, blood (Routine X 2) w Reflex to ID Panel     Status: None (Preliminary result)   Collection Time: 12/02/2015  1:35 PM  Result Value Ref Range Status   Specimen Description BLOOD RIGHT HAND  Final   Special Requests IN PEDIATRIC BOTTLE  4CC  Final   Culture NO GROWTH 3 DAYS  Final   Report Status PENDING  Incomplete  MRSA PCR Screening     Status: None   Collection Time: 01/01/2016 10:24 PM  Result Value Ref Range Status   MRSA by PCR NEGATIVE NEGATIVE Final    Comment:        The GeneXpert MRSA Assay (FDA approved for NASAL specimens only), is one component of a comprehensive MRSA colonization surveillance program. It is not intended to diagnose MRSA infection nor to guide or monitor treatment for MRSA infections.   C difficile quick screen w PCR reflex     Status: None   Collection Time: 12/10/15  9:34 PM  Result Value Ref Range Status   C Diff antigen NEGATIVE NEGATIVE Final   C Diff toxin NEGATIVE NEGATIVE Final   C Diff interpretation No C. difficile detected.  Final      Radiology Studies: Dg Chest 1 View  12/11/2015  CLINICAL DATA:  Dyspnea. EXAM: CHEST 1 VIEW COMPARISON:  12/09/2014 FINDINGS: Dual lumen right central venous  catheter is stable. Cardiomediastinal silhouette is mildly enlarged. Mediastinal contours appear intact. There is no evidence of pneumothorax. There are low lung volumes with prominence of the interstitial markings which may represent interstitial pulmonary edema. Streaky airspace opacities is seen in the left lower lobe, with focal airspace consolidation can't be excluded. There probable bilateral subpulmonic effusions. Osseous structures are without acute abnormality. Soft tissues are grossly normal. IMPRESSION: Low lung volumes with interstitial pulmonary edema and probable bilateral small subpulmonic effusions. More focal airspace consolidation in the left lower lobe cannot be excluded. Electronically Signed   By: Ted Mcalpine M.D.   On: 12/11/2015 22:54   Ct Tibia Fibula Left Wo Contrast  12/12/2015  CLINICAL DATA:  Focal painful swelling on the left lower leg over the last several days. EXAM: CT TIBIA FIBULA LEFT WITHOUT CONTRAST TECHNIQUE: Multidetector CT imaging was performed according to the standard protocol. Multiplanar CT image reconstructions were also generated. COMPARISON:  None. FINDINGS: A total knee prosthesis is partially visualized. The tibial component demonstrates no findings of complicating feature. Overall no significant bony abnormality is observed aside from some degenerative midfoot findings. Plantar calcaneal spur is also present. In the region of concern along the medial portion of the lower leg, there is a dominant 8.6 by 3.5 by 3.0 cm (volume = 47 cc) subcutaneous lesion with some non dependent simple appearing component and a higher density deep in a component. There surrounding stranding in the subcutaneous tissues as well as some mild high-density lobularity adjacent to this dominant lesion most notably in the vicinity of image 25 series 4 along the superior margin. The high density and overall appearance favor a subcutaneous hematoma, with thrombosis of a superficial  venous varix is a differential diagnostic consideration but considered less likely given the overall size of the lesion. There is some inflammatory stranding tracking in the subcutaneous tissues anterior to the tibia and along the anteromedial tibial margin. No extension along the  deep fascia planes or muscular tissues. Along the posterolateral mid calf there is some slight nodularity including a 1.0 cm nodule, likely also due to a much smaller hematoma or venous varix. IMPRESSION: 1. 47 cc in volume subcutaneous lesion with complex internal fluid density along the medial mid calf, probably a subcutaneous hematoma, less likely to be a thrombosed venous varix. Surrounding inflammatory stranding noted tracking along the superficial fascia margin of the adjacent medial calf and tibia. Given the high density in overall apparent size consider an infectious process such as abscess much less likely. 2. No complicating feature along the tibial tray component of the knee prosthesis. 3. We incidentally see degenerative midfoot arthropathy on the left. Electronically Signed   By: Gaylyn Rong M.D.   On: 12/12/2015 16:56      Scheduled Meds: . calcium acetate  667 mg Oral TID WC  . darbepoetin (ARANESP) injection - DIALYSIS  60 mcg Intravenous Q Tue-HD  . doxercalciferol  2 mcg Intravenous Q T,Th,Sa-HD  . doxycycline  100 mg Oral Q12H  . feeding supplement (NEPRO CARB STEADY)  237 mL Oral BID BM  . heparin  5,000 Units Subcutaneous Q8H  . insulin aspart  0-9 Units Subcutaneous Q4H  . midodrine  10 mg Oral Q T,Th,Sa-HD  . multivitamin  1 tablet Oral Daily  . pantoprazole  40 mg Oral Daily  . sodium chloride flush  3 mL Intravenous Q12H   Continuous Infusions:    LOS: 4 days   Time spent: 20 minutes   Debbora Presto, MD [facility] Pager [phone]  If 7PM-7AM, please contact night-coverage [email or web address] Password [facility] 12/13/2015, 10:24 AM

## 2015-12-14 ENCOUNTER — Other Ambulatory Visit (HOSPITAL_COMMUNITY): Payer: Medicare Other

## 2015-12-14 DIAGNOSIS — I951 Orthostatic hypotension: Secondary | ICD-10-CM

## 2015-12-14 LAB — CULTURE, BLOOD (ROUTINE X 2)
CULTURE: NO GROWTH
CULTURE: NO GROWTH

## 2015-12-14 LAB — COMPREHENSIVE METABOLIC PANEL
ALBUMIN: 1.5 g/dL — AB (ref 3.5–5.0)
ALK PHOS: 1006 U/L — AB (ref 38–126)
ALT: 254 U/L — ABNORMAL HIGH (ref 14–54)
ANION GAP: 9 (ref 5–15)
AST: 664 U/L — ABNORMAL HIGH (ref 15–41)
BILIRUBIN TOTAL: 10.6 mg/dL — AB (ref 0.3–1.2)
BUN: 16 mg/dL (ref 6–20)
CALCIUM: 9.2 mg/dL (ref 8.9–10.3)
CO2: 27 mmol/L (ref 22–32)
Chloride: 97 mmol/L — ABNORMAL LOW (ref 101–111)
Creatinine, Ser: 4.72 mg/dL — ABNORMAL HIGH (ref 0.44–1.00)
GFR calc non Af Amer: 8 mL/min — ABNORMAL LOW (ref >60)
GFR, EST AFRICAN AMERICAN: 9 mL/min — AB (ref >60)
GLUCOSE: 95 mg/dL (ref 65–99)
POTASSIUM: 3.7 mmol/L (ref 3.5–5.1)
SODIUM: 133 mmol/L — AB (ref 135–145)
TOTAL PROTEIN: 5.7 g/dL — AB (ref 6.5–8.1)

## 2015-12-14 LAB — CBC
HEMATOCRIT: 28.5 % — AB (ref 36.0–46.0)
HEMOGLOBIN: 8.8 g/dL — AB (ref 12.0–15.0)
MCH: 24.7 pg — ABNORMAL LOW (ref 26.0–34.0)
MCHC: 30.9 g/dL (ref 30.0–36.0)
MCV: 80.1 fL (ref 78.0–100.0)
Platelets: 173 10*3/uL (ref 150–400)
RBC: 3.56 MIL/uL — AB (ref 3.87–5.11)
RDW: 19.2 % — ABNORMAL HIGH (ref 11.5–15.5)
WBC: 8.4 10*3/uL (ref 4.0–10.5)

## 2015-12-14 LAB — GLUCOSE, CAPILLARY
GLUCOSE-CAPILLARY: 116 mg/dL — AB (ref 65–99)
GLUCOSE-CAPILLARY: 116 mg/dL — AB (ref 65–99)
GLUCOSE-CAPILLARY: 165 mg/dL — AB (ref 65–99)
GLUCOSE-CAPILLARY: 69 mg/dL (ref 65–99)
GLUCOSE-CAPILLARY: 94 mg/dL (ref 65–99)
GLUCOSE-CAPILLARY: 96 mg/dL (ref 65–99)
GLUCOSE-CAPILLARY: 99 mg/dL (ref 65–99)

## 2015-12-14 LAB — PROTIME-INR
INR: 1.62 — AB (ref 0.00–1.49)
PROTHROMBIN TIME: 19.3 s — AB (ref 11.6–15.2)

## 2015-12-14 MED ORDER — LIDOCAINE HCL (PF) 1 % IJ SOLN: 5.0000 mL | INTRAMUSCULAR | Status: DC | PRN

## 2015-12-14 MED ORDER — LIDOCAINE-PRILOCAINE 2.5-2.5 % EX CREA: 1.0000 "application " | TOPICAL_CREAM | CUTANEOUS | Status: DC | PRN

## 2015-12-14 MED ORDER — ALBUMIN HUMAN 25 % IV SOLN
INTRAVENOUS | Status: AC
  Filled 2015-12-14: qty 50

## 2015-12-14 MED ORDER — SODIUM CHLORIDE 0.9 % IV SOLN: 100.0000 mL | INTRAVENOUS | Status: DC | PRN

## 2015-12-14 MED ORDER — HEPARIN SODIUM (PORCINE) 1000 UNIT/ML DIALYSIS
1000.0000 [IU] | INTRAMUSCULAR | Status: DC | PRN
Start: 2015-12-14 — End: 2015-12-14

## 2015-12-14 MED ORDER — HYDROCODONE-ACETAMINOPHEN 5-325 MG PO TABS
ORAL_TABLET | ORAL | Status: AC
Start: 2015-12-14 — End: 2015-12-14
  Filled 2015-12-14: qty 1

## 2015-12-14 MED ORDER — HYDROCODONE-ACETAMINOPHEN 5-325 MG PO TABS
1.0000 | ORAL_TABLET | ORAL | Status: DC | PRN
  Administered 2015-12-14 – 2015-12-16 (×4): 1 via ORAL
  Filled 2015-12-14 (×3): qty 1

## 2015-12-14 MED ORDER — MIDODRINE HCL 5 MG PO TABS
ORAL_TABLET | ORAL | Status: AC
  Filled 2015-12-14: qty 2

## 2015-12-14 MED ORDER — ALBUMIN HUMAN 25 % IV SOLN: 25.0000 g | Freq: Once | INTRAVENOUS | Status: DC

## 2015-12-14 MED ORDER — MIDODRINE HCL 5 MG PO TABS
10.0000 mg | ORAL_TABLET | Freq: Three times a day (TID) | ORAL | Status: DC
  Administered 2015-12-14 – 2015-12-17 (×8): 10 mg via ORAL
  Filled 2015-12-14 (×8): qty 2

## 2015-12-14 MED ORDER — PENTAFLUOROPROP-TETRAFLUOROETH EX AERO: 1.0000 "application " | INHALATION_SPRAY | CUTANEOUS | Status: DC | PRN

## 2015-12-14 MED ORDER — HEPARIN SODIUM (PORCINE) 1000 UNIT/ML DIALYSIS: 20.0000 [IU]/kg | INTRAMUSCULAR | Status: DC | PRN

## 2015-12-14 MED ORDER — ALTEPLASE 2 MG IJ SOLR: 2.0000 mg | Freq: Once | INTRAMUSCULAR | Status: DC | PRN

## 2015-12-14 NOTE — Progress Notes (Signed)
Hypoglycemic Event  CBG: 69  Treatment: Boost Breeze  Symptoms: Asymptomatiac  Follow-up CBG: Time: 1357 **CBG Result:94  Possible Reasons for Event: No meals during hemodialysis  Comments/MD notified:no. Protocol initiated. Result is effective    Gershon Musselarbone, Levora DredgeEstrellita Lagunay

## 2015-12-14 NOTE — Progress Notes (Signed)
Patient ID: Katherine [name], female   DOB: 02-Nov-1933, 82 y.o.   MRN: [id number]   PROGRESS NOTE    Katherine [name]  WUJ:[id number] DOB: July 31, 1933 DOA: 12-23-2015  PCP: [name], MD   Brief Narrative:  82 y.o. Female with ESRD on HD, DM type II, resident of [facility] and in process of moving to ALF, presented to [facility] for evaluation of 1-2 weeks duration of diarrhea and had blood work notable for elevated liver enzymes, mixed cholestatic and hepatocellular pattern. Bilirubin is 10, alkaline phosphatase 1200, transaminases in the 300-800 range. Platelets are normal at 169,000, and INR is 1.37. For comparison, 2 months ago the patient's liver chemistries were completely normal and her INR at that time was 1.03.  An abdominal ultrasound notable for significant hepatic steatosis, status post cholecystectomy, 8 mm common bile duct consistent with prior cholecystectomy state but no intrahepatic biliary ductal dilatation, no space-occupying lesions of the liver.  Assessment & Plan:   Principal Problem: New-onset elevation of liver enzymes with jaundice - mixed hepatocellular/cholestatic pattern, without evidence of anatomic biliary obstruction or focal hepatic parenchymal lesions - LFT's better since admission but overall still no significant improvement  - GI team recommends proceeding with CT abd with IV contrast if OK with nephrology team, ? After next HD - appreciate GI team following   Active Problems:   Hypotension 7/10 - requiring transfer to SDU on 7/10 - pt at baseline hypotensive and on midodrine, will continue  - PCCM consulted and has signed off as pt has stabilized  - pt still with intermittent hypotension, increased dose of Midodrine to 10 mg TID     Elevated troponins - in the setting of hypotension  - cardiology consulted, no need for cardiac intervention at this time - ECHO pending     Morbid obesity (HCC) - Body mass index is 40.55 kg/(m^2).     ESRD on dialysis  [facility]) - per nephrology, appreciate assistance     Diarrhea - resolved, contact precautions d/c    Left calf hematoma - after trauma to the area - confirmed with CT scan, will ask ortho to look at this - continue doxy day #3    Diabetes mellitus, insulin dependent (IDDM), with complications of nephropathy  - reasonable inpatient control  - keep on SSI for now    Acute hyperkalemia - resolved     Physical deconditioning - PT eval requested  - SNF recommended, work in progress     Left ventricular diastolic dysfunction, NYHA class 1 - monitor weights  - stable 235 - 238 lbs    DVT prophylaxis: Heparin SQ Code Status: Full  Family Communication: Patient at bedside, left message for daughter Paul Dykes on her cell, awaiting call back  Disposition Plan: SNF once cleared from GI stand point   Consultants:   GI  Nephrology   Cardiology   Ortho   Procedures:   None  Antimicrobials:   Doxycycline 7/11 -->  Subjective: No events overnight.   Objective: Filed Vitals:   12/14/15 1135 12/14/15 1200 12/14/15 1210 12/14/15 1300  BP: 90/34 98/47 103/48 95/57  Pulse: 75 75 77 71  Temp:   96.7 F (35.9 C) 97.3 F (36.3 C)  TempSrc:   Oral Oral  Resp:   19 25  Weight:   107.2 kg (236 lb 5.3 oz)   SpO2:   100% 97%    Intake/Output Summary (Last 24 hours) at 12/14/15 1307 Last data filed at 12/14/15 1210  Gross per 24 hour  Intake    240 ml  Output    748 ml  Net   -508 ml   Filed Weights   12/14/15 0338 12/14/15 0804 12/14/15 1210  Weight: 108.9 kg (240 lb 1.3 oz) 107.3 kg (236 lb 8.9 oz) 107.2 kg (236 lb 5.3 oz)    Examination:  General exam: Appears calm and comfortable, jaundiced sclera  Respiratory system: Respiratory effort normal. Diminished air movement at bases Cardiovascular system: S1 & S2 heard, RRR. No rubs, gallops or clicks. No pedal edema. Gastrointestinal system: Abdomen is nondistended, soft and nontender. No organomegaly or masses felt.    Central nervous system: Alert and oriented. No focal neurological deficits. Extremities: Symmetric 5 x 5 power. Left anterior shin area hematoma, slightly TTP and with surrounding erythema, warmth to touch, seems bit worse today  Psychiatry: Judgement and insight appear normal. Mood & affect appropriate.   Data Reviewed: I have personally reviewed following labs and imaging studies  CBC:  Recent Labs Lab 12/10/15 0258 12/11/15 0614 12/12/15 0644 12/13/15 0517 12/14/15 0222  WBC 8.0 7.3 8.1 9.0 8.4  HGB 10.1* 9.7* 9.2* 9.2* 8.8*  HCT 31.9* 30.8* 29.9* 28.9* 28.5*  MCV 78.8 79.6 79.9 79.6 80.1  PLT 152 152 161 158 173   Basic Metabolic Panel:  Recent Labs Lab 12/10/15 0258 12/11/15 0614 12/12/15 0644 12/13/15 0517 12/14/15 0222  NA 131* 133* 134* 135 133*  K 3.8 3.8 4.0 3.8 3.7  CL 96* 96* 97* 99* 97*  CO2 GLUCOSE 73 77 88 89 95  BUN 7 16 26* 9 16  CREATININE 2.88* 4.78* 6.22* 3.23* 4.72*  CALCIUM 8.0* 9.0 8.8* 9.0 9.2   Liver Function Tests:  Recent Labs Lab 12/10/15 0258 12/11/15 0614 12/12/15 0644 12/13/15 1044 12/14/15 0222  AST 772* 789* 762* 732* 664*  ALT 290* 286* 288* 284* 254*  ALKPHOS 1058* 1062* 1087* 1104* 1006*  BILITOT 8.3* 9.2* 10.1* 10.9* 10.6*  PROT 6.0* 5.9* 5.9* 6.2* 5.7*  ALBUMIN 1.7* 1.6* 1.5* 1.6* 1.5*    Recent Labs Lab 12/14/2015 1111  LIPASE 141*    Recent Labs Lab 12/16/2015 1111  AMMONIA 72*   Coagulation Profile:  Recent Labs Lab 18-Dec-2015 1111 12/10/15 0258 12/11/15 0614 12/14/15 0222  INR 1.37 1.60* 1.47 1.62*   Cardiac Enzymes:  Recent Labs Lab 12/04/2015 1111  12/10/15 0258 12/10/15 1255 12/11/15 1859 12/12/15 0020 12/12/15 0644  CKTOTAL 372*  --   --   --   --   --   --   TROPONINI 0.22*  < > 0.20* 0.26* 0.28* 0.28* 0.27*  < > = values in this interval not displayed. CBG:  Recent Labs Lab 12/13/15 2018 12/14/15 12/14/15 0358 12/14/15 0739 12/14/15 1257  GLUCAP 192* 116* 96 99  69   Urine analysis:    Component Value Date/Time   COLORURINE AMBER* 10/05/2015 1229   APPEARANCEUR TURBID* 10/05/2015 1229   LABSPEC 1.020 10/05/2015 1229   PHURINE 5.0 10/05/2015 1229   GLUCOSEU NEGATIVE 10/05/2015 1229   HGBUR LARGE* 10/05/2015 1229   BILIRUBINUR LARGE* 10/05/2015 1229   KETONESUR 15* 10/05/2015 1229   PROTEINUR >300* 10/05/2015 1229   UROBILINOGEN 1.0 02/11/2014 0913   NITRITE NEGATIVE 10/05/2015 1229   LEUKOCYTESUR LARGE* 10/05/2015 1229   Recent Results (from the past 240 hour(s))  Culture, blood (Routine X 2) w Reflex to ID Panel     Status: None (Preliminary result)   Collection Time: 12/12/2015  1:30 PM  Result Value Ref Range Status   Specimen Description BLOOD RIGHT ANTECUBITAL  Final   Special Requests   Final    BOTTLES DRAWN AEROBIC AND ANAEROBIC  10CC AER 5CC ANA   Culture NO GROWTH 4 DAYS  Final   Report Status PENDING  Incomplete  Culture, blood (Routine X 2) w Reflex to ID Panel     Status: None (Preliminary result)   Collection Time: 12/23/2015  1:35 PM  Result Value Ref Range Status   Specimen Description BLOOD RIGHT HAND  Final   Special Requests IN PEDIATRIC BOTTLE  4CC  Final   Culture NO GROWTH 4 DAYS  Final   Report Status PENDING  Incomplete  MRSA PCR Screening     Status: None   Collection Time: 12/04/2015 10:24 PM  Result Value Ref Range Status   MRSA by PCR NEGATIVE NEGATIVE Final    Comment:        The GeneXpert MRSA Assay (FDA approved for NASAL specimens only), is one component of a comprehensive MRSA colonization surveillance program. It is not intended to diagnose MRSA infection nor to guide or monitor treatment for MRSA infections.   C difficile quick screen w PCR reflex     Status: None   Collection Time: 12/10/15  9:34 PM  Result Value Ref Range Status   C Diff antigen NEGATIVE NEGATIVE Final   C Diff toxin NEGATIVE NEGATIVE Final   C Diff interpretation No C. difficile detected.  Final      Radiology  Studies: Ct Tibia Fibula Left Wo Contrast  12/12/2015  CLINICAL DATA:  Focal painful swelling on the left lower leg over the last several days. EXAM: CT TIBIA FIBULA LEFT WITHOUT CONTRAST TECHNIQUE: Multidetector CT imaging was performed according to the standard protocol. Multiplanar CT image reconstructions were also generated. COMPARISON:  None. FINDINGS: A total knee prosthesis is partially visualized. The tibial component demonstrates no findings of complicating feature. Overall no significant bony abnormality is observed aside from some degenerative midfoot findings. Plantar calcaneal spur is also present. In the region of concern along the medial portion of the lower leg, there is a dominant 8.6 by 3.5 by 3.0 cm (volume = 47 cc) subcutaneous lesion with some non dependent simple appearing component and a higher density deep in a component. There surrounding stranding in the subcutaneous tissues as well as some mild high-density lobularity adjacent to this dominant lesion most notably in the vicinity of image 25 series 4 along the superior margin. The high density and overall appearance favor a subcutaneous hematoma, with thrombosis of a superficial venous varix is a differential diagnostic consideration but considered less likely given the overall size of the lesion. There is some inflammatory stranding tracking in the subcutaneous tissues anterior to the tibia and along the anteromedial tibial margin. No extension along the deep fascia planes or muscular tissues. Along the posterolateral mid calf there is some slight nodularity including a 1.0 cm nodule, likely also due to a much smaller hematoma or venous varix. IMPRESSION: 1. 47 cc in volume subcutaneous lesion with complex internal fluid density along the medial mid calf, probably a subcutaneous hematoma, less likely to be a thrombosed venous varix. Surrounding inflammatory stranding noted tracking along the superficial fascia margin of the adjacent  medial calf and tibia. Given the high density in overall apparent size consider an infectious process such as abscess much less likely. 2. No complicating feature along the tibial tray component of the knee prosthesis. 3.  We incidentally see degenerative midfoot arthropathy on the left. Electronically Signed   By: Gaylyn RongWalter  Liebkemann M.D.   On: 12/12/2015 16:56      Scheduled Meds: . darbepoetin (ARANESP) injection - DIALYSIS  60 mcg Intravenous Q Tue-HD  . doxycycline  100 mg Oral Q12H  . feeding supplement (NEPRO CARB STEADY)  237 mL Oral BID BM  . heparin  5,000 Units Subcutaneous Q8H  . HYDROcodone-acetaminophen      . insulin aspart  0-9 Units Subcutaneous Q4H  . midodrine  10 mg Oral TID WC  . multivitamin  1 tablet Oral Daily  . pantoprazole  40 mg Oral Daily  . senna-docusate  1 tablet Oral BID  . sodium chloride flush  3 mL Intravenous Q12H   Continuous Infusions:    LOS: 5 days   Time spent: 20 minutes   Debbora PrestoMAGICK-Bular Hickok, MD [facility] Pager [phone]  If 7PM-7AM, please contact night-coverage [email or web address] Password [email or web address] 12/14/2015, 1:07 PM

## 2015-12-14 NOTE — Progress Notes (Signed)
Patient seen in dialysis unit.  No complaints other than some mild nausea.  LFTS significantly elevated, but slow down-trend.  No clear etiology, but shock liver from ischemia from hypotension is leading consideration.  In light of patient's age, comorbidities and overall clinical state, I doubt there is much utility in obtaining liver biopsy, as it won't likely appreciably alter our treatment plan (which is supportive management and following LFTs).

## 2015-12-14 NOTE — Progress Notes (Signed)
PATIENT ID:  Katherine [name] is an [id number] with ESRD, hypertension, hyperlipidemia, and diabetes here with shock liver in the setting of diarrhea and hypotension  SUBJECTIVE:  Feeling tired.   PHYSICAL EXAM Filed Vitals:   12/14/15 0925 12/14/15 0956 12/14/15 1030 12/14/15 1100  BP: 96/27 91/38 90/41  93/59  Pulse: 79 77 76 72  Temp:      TempSrc:      Resp:      Weight:      SpO2:       General:  Ill-appearing.  No acute distress 3 Neck: No JVD Lungs:  CTAB anteriorly  Heart:  RRR.  II/VI systolic murmur.  No r/g.  Normal S1/S2 Abdomen:  Soft, NT.  +BS Extremities:  No edema.  L LE hematoma with overlying echar  LABS: Lab Results  Component Value Date   TROPONINI 0.27* 12/12/2015   Results for orders placed or performed during the hospital encounter of 12/14/2015 (from the past 24 hour(s))  Glucose, capillary     Status: None   Collection Time: 12/13/15  1:22 PM  Result Value Ref Range   Glucose-Capillary 95 65 - 99 mg/dL  Glucose, capillary     Status: Abnormal   Collection Time: 12/13/15  4:08 PM  Result Value Ref Range   Glucose-Capillary 181 (H) 65 - 99 mg/dL  Glucose, capillary     Status: Abnormal   Collection Time: 12/13/15  8:18 PM  Result Value Ref Range   Glucose-Capillary 192 (H) 65 - 99 mg/dL  Glucose, capillary     Status: Abnormal   Collection Time: 12/14/15 12:00 AM  Result Value Ref Range   Glucose-Capillary 116 (H) 65 - 99 mg/dL  Comprehensive metabolic panel     Status: Abnormal   Collection Time: 12/14/15  2:22 AM  Result Value Ref Range   Sodium 133 (L) 135 - 145 mmol/L   Potassium 3.7 3.5 - 5.1 mmol/L   Chloride 97 (L) 101 - 111 mmol/L   CO2 27 22 - 32 mmol/L   Glucose, Bld 95 65 - 99 mg/dL   BUN 16 6 - 20 mg/dL   Creatinine, Ser 1.614.72 (H) 0.44 - 1.00 mg/dL   Calcium 9.2 8.9 - 09.610.3 mg/dL   Total Protein 5.7 (L) 6.5 - 8.1 g/dL   Albumin 1.5 (L) 3.5 - 5.0 g/dL   AST 045664 (H) 15 - 41 U/L   ALT 254 (H) 14 - 54 U/L   Alkaline Phosphatase 1006 (H) 38  - 126 U/L   Total Bilirubin 10.6 (H) 0.3 - 1.2 mg/dL   GFR calc non Af Amer 8 (L) >60 mL/min   GFR calc Af Amer 9 (L) >60 mL/min   Anion gap 9 5 - 15  CBC     Status: Abnormal   Collection Time: 12/14/15  2:22 AM  Result Value Ref Range   WBC 8.4 4.0 - 10.5 K/uL   RBC 3.56 (L) 3.87 - 5.11 MIL/uL   Hemoglobin 8.8 (L) 12.0 - 15.0 g/dL   HCT 40.928.5 (L) 81.136.0 - 91.446.0 %   MCV 80.1 78.0 - 100.0 fL   MCH 24.7 (L) 26.0 - 34.0 pg   MCHC 30.9 30.0 - 36.0 g/dL   RDW 78.219.2 (H) 95.611.5 - 21.315.5 %   Platelets 173 150 - 400 K/uL  Protime-INR     Status: Abnormal   Collection Time: 12/14/15  2:22 AM  Result Value Ref Range   Prothrombin Time 19.3 (H) 11.6 - 15.2 seconds  INR 1.62 (H) 0.00 - 1.49  Glucose, capillary     Status: None   Collection Time: 12/14/15  3:58 AM  Result Value Ref Range   Glucose-Capillary 96 65 - 99 mg/dL  Glucose, capillary     Status: None   Collection Time: 12/14/15  7:39 AM  Result Value Ref Range   Glucose-Capillary 99 65 - 99 mg/dL    Intake/Output Summary (Last 24 hours) at 12/14/15 1115 Last data filed at 12/13/15 1400  Gross per 24 hour  Intake    240 ml  Output      0 ml  Net    240 ml    ASSESSMENT AND PLAN:  Principal Problem:   Obstructive jaundice Active Problems:   HTN (hypertension)   Dyslipidemia   Morbid obesity (HCC)   ESRD on dialysis (HCC)   Left calf hematoma   Diabetes mellitus, insulin dependent (IDDM), uncontrolled (HCC)   Acute hyperkalemia   Elevated troponin   Physical deconditioning   Left ventricular diastolic dysfunction, NYHA class 1   Autonomic dysfunction   ESRD (end stage renal disease) (HCC)   Diarrhea   Dyspnea   Arterial hypotension   Elevated liver function tests   # Elevated troponin: Peaked at 0.28.  Pattern is most consistent with demand ischemia.  She denies any chest pain or shortness of breath.  Echo pending and should occur today.  # Hypotension: BP remains low despite receiving midodrine on HD days.  Would  consider increasing to  daily up to three times daily as needed.    # Transaminitis: Etiology unclear.  Non-obstructive and labs are not consistent with autoimmune hepatitis.  She does not appear to be in CHF consistent with congestive hepatopathy. Echo pending as above.   [name] C. Duke Salvia, MD, [facility] 12/14/2015 11:15 AM

## 2015-12-14 NOTE — Progress Notes (Signed)
[name] Progress Note  Assessment/Plan: 1. Jaundice/ ^LFT's - no biliary obstruction on [place]. /ANA/SPEPunremarkable- labs trending down but BP still lower than usual Cardiology has seen, no signs of CHF to suggest liver congestion.  Agree.  2. ESRD HD tts - K 3.7 -  Use 4 k bath today - UF as able - not eating; start keeping even then increase goal to 1L or more if BP allows as it seems to be coming up. 3 Chronic hypotension/volume on midodrine- her BP is much lower here than outpt center - pre and post BPs run 120 - 140 +/- range- transferred to [facility] last evening due to low BP pre HD wt 108.6 goal on HD 2 L as BP allows (CXR 7/10 showed some volume) Net UF 850 on Tuesday; pre BP 60s - recheck then in 90s - plan start with 1 L goal and bump up if BP remains stable; if BP drops try albumin- have increased midodrine to 10 mg tid.   4 Debility - WC bound at present; has been living at [facility]- worked with PT while supine in bed - very tiring - not even sitting up on side of bed - she will need to go back to SNF - was there prior to recent d/c to [name] 6 MBD iPTH 300s -on hectorol 2 and 1 phoslo ac - hold both for now due to elevated corrected Ca  7 Anemia hgb 8,8 - sig drop from outpt Hgb of 11.1 7/6 - last tsat was 72% in 6/29 and still on venofer - last Mircera was 50 on 6/8 - resumed aranesp 60 on Tuesday - no Fe for now 8 Nutrition alb 1.5 down due to acute liver issues- continue renal diet/vits/supplment- eating very little; expect unintentional weight loss because not eating 9 Diarrhea - c diff neg 10. Abnormal troponin - per cards 11. ID - BC 7/8 no growth - recheck today do to persistent low BP  12. Left shin hematoma - softer - surrounding erythema - not sure if hematoma worse or secondary to K pad - d/w primary - she will ask ortho to see 13. Failure-to-thrive   Sheffield Slider, PA-C [facility] Beeper [phone] 12/14/2015,8:35 AM  LOS: 5 days     Pt seen, examined and agree w A/P as above.  Vinson Moselle MD [facility] pager [phone]    cell [phone] 12/14/2015, 12:57 PM     Subjective:   Feels a little better, but has mucous in her mouth from coughing that she is spitting out; had BM that she wasn't aware of.  Objective Filed Vitals:   12/14/15 0804 12/14/15 0810 12/14/15 0815 12/14/15 0830  BP: 94/41 89/42 116/65 129/38  Pulse: 86 76 73 77  Temp: 97.9 F (36.6 C)     TempSrc: Oral     Resp: 20     Weight: 107.3 kg (236 lb 8.9 oz)     SpO2: 100%      Physical Exam General: supine on HD ill appearing Heart: RRR Lungs: grossly clear Abdomen: obese soft NT Extremities: right LE no edema left shin hematoma soft very tender almost fluctuant, surrounding erythema Dialysis Access: left upper AVF and right IJ  Dialysis Orders: TTS NW 4h 108kg 2/2.25 bath Hep 6000 R IJ cath (maturing L AVF) hect 2 ug venofer 50 Last hb 11.1  Additional Objective Labs: Basic Metabolic Panel:  Recent Labs Lab 12/12/15 0644 12/13/15 0517 12/14/15 0222  NA 134* 135 133*  K 4.0 3.8 3.7  CL 97* 99* 97*  CO2 27 27 27   GLUCOSE 88 89 95  BUN 26* 9 16  CREATININE 6.22* 3.23* 4.72*  CALCIUM 8.8* 9.0 9.2   Liver Function Tests:  Recent Labs Lab 12/12/15 0644 12/13/15 1044 12/14/15 0222  AST 762* 732* 664*  ALT 288* 284* 254*  ALKPHOS 1087* 1104* 1006*  BILITOT 10.1* 10.9* 10.6*  PROT 5.9* 6.2* 5.7*  ALBUMIN 1.5* 1.6* 1.5*    Recent Labs Lab 01/23/2016 1111  LIPASE 141*   CBC:  Recent Labs Lab 12/10/15 0258 12/11/15 0614 12/12/15 0644 12/13/15 0517 12/14/15 0222  WBC 8.0 7.3 8.1 9.0 8.4  HGB 10.1* 9.7* 9.2* 9.2* 8.8*  HCT 31.9* 30.8* 29.9* 28.9* 28.5*  MCV 78.8 79.6 79.9 79.6 80.1  PLT 152 152 161 158 173   Blood Culture    Component Value Date/Time   SDES BLOOD RIGHT HAND November 02, 2015 1335   SPECREQUEST IN PEDIATRIC BOTTLE  4CC November 02, 2015 1335   CULT NO GROWTH 4 DAYS November 02, 2015  1335   REPTSTATUS PENDING November 02, 2015 1335    Cardiac Enzymes:  Recent Labs Lab 01/23/2016 1111  12/10/15 0258 12/10/15 1255 12/11/15 1859 12/12/15 0020 12/12/15 0644  CKTOTAL 372*  --   --   --   --   --   --   TROPONINI 0.22*  < > 0.20* 0.26* 0.28* 0.28* 0.27*  < > = values in this interval not displayed. CBG:  Recent Labs Lab 12/13/15 1322 12/13/15 1608 12/13/15 2018 12/14/15 12/14/15 0358  GLUCAP 95 181* 192* 116* 96   Iron Studies: No results for input(s): IRON, TIBC, TRANSFERRIN, FERRITIN in the last 72 hours. Lab Results  Component Value Date   INR 1.62* 12/14/2015   INR 1.47 12/11/2015   INR 1.60* 12/10/2015   Studies/Results: Ct Tibia Fibula Left Wo Contrast  12/12/2015  CLINICAL DATA:  Focal painful swelling on the left lower leg over the last several days. EXAM: CT TIBIA FIBULA LEFT WITHOUT CONTRAST TECHNIQUE: Multidetector CT imaging was performed according to the standard protocol. Multiplanar CT image reconstructions were also generated. COMPARISON:  None. FINDINGS: A total knee prosthesis is partially visualized. The tibial component demonstrates no findings of complicating feature. Overall no significant bony abnormality is observed aside from some degenerative midfoot findings. Plantar calcaneal spur is also present. In the region of concern along the medial portion of the lower leg, there is a dominant 8.6 by 3.5 by 3.0 cm (volume = 47 cc) subcutaneous lesion with some non dependent simple appearing component and a higher density deep in a component. There surrounding stranding in the subcutaneous tissues as well as some mild high-density lobularity adjacent to this dominant lesion most notably in the vicinity of image 25 series 4 along the superior margin. The high density and overall appearance favor a subcutaneous hematoma, with thrombosis of a superficial venous varix is a differential diagnostic consideration but considered less likely given the overall size of  the lesion. There is some inflammatory stranding tracking in the subcutaneous tissues anterior to the tibia and along the anteromedial tibial margin. No extension along the deep fascia planes or muscular tissues. Along the posterolateral mid calf there is some slight nodularity including a 1.0 cm nodule, likely also due to a much smaller hematoma or venous varix. IMPRESSION: 1. 47 cc in volume subcutaneous lesion with complex internal fluid density along the medial mid calf, probably a subcutaneous hematoma, less likely to be a thrombosed venous varix. Surrounding inflammatory  stranding noted tracking along the superficial fascia margin of the adjacent medial calf and tibia. Given the high density in overall apparent size consider an infectious process such as abscess much less likely. 2. No complicating feature along the tibial tray component of the knee prosthesis. 3. We incidentally see degenerative midfoot arthropathy on the left. Electronically Signed   By: Gaylyn Rong M.D.   On: 12/12/2015 16:56   Medications:   . albumin human  25 g Intravenous Once  . albumin human      . darbepoetin (ARANESP) injection - DIALYSIS  60 mcg Intravenous Q Tue-HD  . doxycycline  100 mg Oral Q12H  . feeding supplement (NEPRO CARB STEADY)  237 mL Oral BID BM  . heparin  5,000 Units Subcutaneous Q8H  . insulin aspart  0-9 Units Subcutaneous Q4H  . midodrine      . midodrine  10 mg Oral Q T,Th,Sa-HD  . multivitamin  1 tablet Oral Daily  . pantoprazole  40 mg Oral Daily  . senna-docusate  1 tablet Oral BID  . sodium chloride flush  3 mL Intravenous Q12H

## 2015-12-15 ENCOUNTER — Inpatient Hospital Stay (HOSPITAL_COMMUNITY): Payer: Medicare Other

## 2015-12-15 DIAGNOSIS — I509 Heart failure, unspecified: Secondary | ICD-10-CM

## 2015-12-15 DIAGNOSIS — R7989 Other specified abnormal findings of blood chemistry: Secondary | ICD-10-CM | POA: Insufficient documentation

## 2015-12-15 DIAGNOSIS — R945 Abnormal results of liver function studies: Secondary | ICD-10-CM | POA: Insufficient documentation

## 2015-12-15 LAB — ECHOCARDIOGRAM COMPLETE
HEIGHTINCHES: 64 in
WEIGHTICAEL: 3834.24 [oz_av]

## 2015-12-15 LAB — CBC
HCT: 29.1 % — ABNORMAL LOW (ref 36.0–46.0)
Hemoglobin: 9 g/dL — ABNORMAL LOW (ref 12.0–15.0)
MCH: 24.9 pg — ABNORMAL LOW (ref 26.0–34.0)
MCHC: 30.9 g/dL (ref 30.0–36.0)
MCV: 80.6 fL (ref 78.0–100.0)
Platelets: 183 10*3/uL (ref 150–400)
RBC: 3.61 MIL/uL — ABNORMAL LOW (ref 3.87–5.11)
RDW: 20 % — AB (ref 11.5–15.5)
WBC: 9.1 10*3/uL (ref 4.0–10.5)

## 2015-12-15 LAB — GLUCOSE, CAPILLARY
GLUCOSE-CAPILLARY: 110 mg/dL — AB (ref 65–99)
Glucose-Capillary: 109 mg/dL — ABNORMAL HIGH (ref 65–99)
Glucose-Capillary: 116 mg/dL — ABNORMAL HIGH (ref 65–99)
Glucose-Capillary: 120 mg/dL — ABNORMAL HIGH (ref 65–99)
Glucose-Capillary: 122 mg/dL — ABNORMAL HIGH (ref 65–99)
Glucose-Capillary: 95 mg/dL (ref 65–99)

## 2015-12-15 LAB — BASIC METABOLIC PANEL
ANION GAP: 8 (ref 5–15)
BUN: 8 mg/dL (ref 6–20)
CHLORIDE: 99 mmol/L — AB (ref 101–111)
CO2: 26 mmol/L (ref 22–32)
Calcium: 9.1 mg/dL (ref 8.9–10.3)
Creatinine, Ser: 2.87 mg/dL — ABNORMAL HIGH (ref 0.44–1.00)
GFR calc Af Amer: 17 mL/min — ABNORMAL LOW (ref >60)
GFR, EST NON AFRICAN AMERICAN: 14 mL/min — AB (ref >60)
GLUCOSE: 102 mg/dL — AB (ref 65–99)
POTASSIUM: 4.3 mmol/L (ref 3.5–5.1)
SODIUM: 133 mmol/L — AB (ref 135–145)

## 2015-12-15 LAB — HEPATIC FUNCTION PANEL
ALBUMIN: 1.6 g/dL — AB (ref 3.5–5.0)
ALK PHOS: 996 U/L — AB (ref 38–126)
ALT: 259 U/L — AB (ref 14–54)
AST: 702 U/L — AB (ref 15–41)
BILIRUBIN INDIRECT: 4 mg/dL — AB (ref 0.3–0.9)
Bilirubin, Direct: 6.7 mg/dL — ABNORMAL HIGH (ref 0.1–0.5)
TOTAL PROTEIN: 6.2 g/dL — AB (ref 6.5–8.1)
Total Bilirubin: 10.7 mg/dL — ABNORMAL HIGH (ref 0.3–1.2)

## 2015-12-15 MED ORDER — DIATRIZOATE MEGLUMINE & SODIUM 66-10 % PO SOLN
ORAL | Status: AC
  Filled 2015-12-15: qty 30

## 2015-12-15 MED ORDER — PERFLUTREN LIPID MICROSPHERE
1.0000 mL | INTRAVENOUS | Status: AC | PRN
  Administered 2015-12-15: 2 mL via INTRAVENOUS
  Filled 2015-12-15: qty 10

## 2015-12-15 NOTE — Care Management Important Message (Signed)
Important Message  Patient Details  Name: Katherine [name] MRN: [id number] Date of Birth: [birth year]   Medicare Important Message Given:  Yes    Demar Shad Abena 12/15/2015, 11:29 AM

## 2015-12-15 NOTE — Progress Notes (Signed)
PATIENT ID:  Katherine [name] is an [age]F with ESRD, hypertension, hyperlipidemia, and diabetes here with shock liver in the setting of diarrhea and hypotension  SUBJECTIVE:  Feeling tired.  Denies chest pain or shortness of breath.   PHYSICAL EXAM Filed Vitals:   12/15/15 0300 12/15/15 0414 12/15/15 0415 12/15/15 0756  BP: 82/56 92/45  89/37  Pulse: 86 92  83  Temp:  98.4 F (36.9 C)  98.4 F (36.9 C)  TempSrc:  Oral  Oral  Resp: 13 17  22   Height:      Weight:   239 lb 10.2 oz (108.7 kg)   SpO2: 98% 98%  96%   General:  Ill-appearing.  No acute distress 3 Neck: No JVD Lungs:  CTAB anteriorly  Heart:  RRR.  II/VI systolic murmur.  No r/g.  Normal S1/S2 Abdomen:  Soft, NT.  +BS Extremities:  No edema.  L LE hematoma with overlying echar  LABS: Lab Results  Component Value Date   TROPONINI 0.27* 12/12/2015   Results for orders placed or performed during the hospital encounter of 12/12/2015 (from the past 24 hour(s))  Glucose, capillary     Status: None   Collection Time: 12/14/15 12:57 PM  Result Value Ref Range   Glucose-Capillary 69 65 - 99 mg/dL  Glucose, capillary     Status: None   Collection Time: 12/14/15  1:55 PM  Result Value Ref Range   Glucose-Capillary 94 65 - 99 mg/dL  Glucose, capillary     Status: Abnormal   Collection Time: 12/14/15  4:36 PM  Result Value Ref Range   Glucose-Capillary 165 (H) 65 - 99 mg/dL  Glucose, capillary     Status: Abnormal   Collection Time: 12/14/15  7:53 PM  Result Value Ref Range   Glucose-Capillary 116 (H) 65 - 99 mg/dL  Glucose, capillary     Status: Abnormal   Collection Time: 12/14/15 11:53 PM  Result Value Ref Range   Glucose-Capillary 120 (H) 65 - 99 mg/dL  CBC     Status: Abnormal   Collection Time: 12/15/15  2:48 AM  Result Value Ref Range   WBC 9.1 4.0 - 10.5 K/uL   RBC 3.61 (L) 3.87 - 5.11 MIL/uL   Hemoglobin 9.0 (L) 12.0 - 15.0 g/dL   HCT 16.129.1 (L) 09.636.0 - 04.546.0 %   MCV 80.6 78.0 - 100.0 fL   MCH 24.9 (L) 26.0 -  34.0 pg   MCHC 30.9 30.0 - 36.0 g/dL   RDW 40.920.0 (H) 81.111.5 - 91.415.5 %   Platelets 183 150 - 400 K/uL  Basic metabolic panel     Status: Abnormal   Collection Time: 12/15/15  2:48 AM  Result Value Ref Range   Sodium 133 (L) 135 - 145 mmol/L   Potassium 4.3 3.5 - 5.1 mmol/L   Chloride 99 (L) 101 - 111 mmol/L   CO2 26 22 - 32 mmol/L   Glucose, Bld 102 (H) 65 - 99 mg/dL   BUN 8 6 - 20 mg/dL   Creatinine, Ser 7.822.87 (H) 0.44 - 1.00 mg/dL   Calcium 9.1 8.9 - 95.610.3 mg/dL   GFR calc non Af Amer 14 (L) >60 mL/min   GFR calc Af Amer 17 (L) >60 mL/min   Anion gap 8 5 - 15  Glucose, capillary     Status: None   Collection Time: 12/15/15  4:11 AM  Result Value Ref Range   Glucose-Capillary 95 65 - 99 mg/dL  Glucose, capillary  Status: Abnormal   Collection Time: 12/15/15  7:55 AM  Result Value Ref Range   Glucose-Capillary 110 (H) 65 - 99 mg/dL  Hepatic function panel     Status: Abnormal   Collection Time: 12/15/15 10:47 AM  Result Value Ref Range   Total Protein 6.2 (L) 6.5 - 8.1 g/dL   Albumin 1.6 (L) 3.5 - 5.0 g/dL   AST 098 (H) 15 - 41 U/L   ALT 259 (H) 14 - 54 U/L   Alkaline Phosphatase 996 (H) 38 - 126 U/L   Total Bilirubin 10.7 (H) 0.3 - 1.2 mg/dL   Bilirubin, Direct 6.7 (H) 0.1 - 0.5 mg/dL   Indirect Bilirubin 4.0 (H) 0.3 - 0.9 mg/dL    Intake/Output Summary (Last 24 hours) at 12/15/15 1136 Last data filed at 12/14/15 1336  Gross per 24 hour  Intake      3 ml  Output    748 ml  Net   -745 ml   Echo 12/15/15: Study Conclusions  - Left ventricle: The cavity size was normal. Wall thickness was  increased in a pattern of mild LVH. Systolic function was normal.  The estimated ejection fraction was in the range of 60% to 65%.  Wall motion was normal; there were no regional wall motion  abnormalities. Doppler parameters are consistent with abnormal  left ventricular relaxation (grade 1 diastolic dysfunction). - Right atrium: The atrium was mildly dilated.  ASSESSMENT  AND PLAN:  Principal Problem:   Obstructive jaundice Active Problems:   HTN (hypertension)   Dyslipidemia   Morbid obesity (HCC)   ESRD on dialysis (HCC)   Left calf hematoma   Diabetes mellitus, insulin dependent (IDDM), uncontrolled (HCC)   Acute hyperkalemia   Elevated troponin   Physical deconditioning   Left ventricular diastolic dysfunction, NYHA class 1   Autonomic dysfunction   ESRD (end stage renal disease) (HCC)   Diarrhea   Dyspnea   Arterial hypotension   Elevated liver function tests   # Elevated troponin: Peaked at 0.28.  Pattern is most consistent with demand ischemia.  She denies any chest pain or shortness of breath.  Echo shows normal systolic function and grade 1 diastolic dysfunction.    # Hypotension: BP remains low so she was started on midodrine.  # Transaminitis: Etiology unclear.  Non-obstructive and labs are not consistent with autoimmune hepatitis or congestive hepatopathy.  No heart failure noted on echo and RA pressures are normal.  No IVC dilated on my independent review of her echo.    We will sign off and arrange for outpatient follow-up. Please call if there are any additional needs while inpatient.  Bonetta Mostek C. Duke Salvia, MD, [facility] 12/15/2015 11:36 AM

## 2015-12-15 NOTE — Progress Notes (Signed)
  [name] has been performed with definite.  Katherine [name] 12/15/2015, 8:27 AM

## 2015-12-15 NOTE — Progress Notes (Signed)
CSW confirmed with patient that she wants to return [facility] and get therapy there rather than go to short term SNF again  CS[facility] confirmed they can take patient at current mobility level- Shauna planning on coming Monday to evaluate patient for return [facility]  CSW will continue to follow  Katherine LotJenna [name], [facility] Clinical Social Worker [phone]

## 2015-12-15 NOTE — Progress Notes (Signed)
Patient ID: MI BALLA, female   DOB: 03-Dec-1933, 82 y.o.   MRN: [id number]   PROGRESS NOTE    Katherine [name]  NWG:[id number] DOB: February 09, 1934 DOA: 12/28/2015  PCP: [name], MD   Brief Narrative:  82 y.o. Female with ESRD on HD, DM type II, resident of [facility] and in process of moving to ALF, presented to [facility] for evaluation of 1-2 weeks duration of diarrhea and had blood work notable for elevated liver enzymes, mixed cholestatic and hepatocellular pattern. Bilirubin is 10, alkaline phosphatase 1200, transaminases in the 300-800 range. Platelets are normal at 169,000, and INR is 1.37. For comparison, 2 months ago the patient's liver chemistries were completely normal and her INR at that time was 1.03.  An abdominal ultrasound notable for significant hepatic steatosis, status post cholecystectomy, 8 mm common bile duct consistent with prior cholecystectomy state but no intrahepatic biliary ductal dilatation, no space-occupying lesions of the liver.  Assessment & Plan:   Principal Problem: New-onset elevation of liver enzymes with jaundice - mixed hepatocellular/cholestatic pattern, without evidence of anatomic biliary obstruction or focal hepatic parenchymal lesions - LFT's better since admission but overall still no significant improvement  - will get a CT abd and pelvis for further evaluation.   Active Problems:   Hypotension 7/10 - requiring transfer to SDU on 7/10 - pt at baseline hypotensive and on midodrine, will continue  - PCCM consulted and has signed off as pt has stabilized  - pt still with intermittent hypotension, increased dose of Midodrine to 10 mg TID  - her bp parameters are slightly better, and she is asymptomatic.     Elevated troponins - in the setting of hypotension  - cardiology consulted, no need for cardiac intervention at this time, elevated troponins probably from demand ischemia.  - ECHO shows LVEF of 6- to 65%, wall motion normal and no regional  wall motion abnormalities. dopper parameters consistent with abnormal left ventricular relaxation grade 1 diastolic dysfunction. Right atrium dilated.      Morbid obesity (HCC) - Body mass index is 41.11 kg/(m^2).     ESRD on dialysis [facility]) - per nephrology, appreciate assistance  Further recommendations as per renal.     Diarrhea - resolved, contact precautions d/c    Left calf hematoma - after trauma to the area, with surrounding erythema and tenderness, started on doxycycline, pain is improving with pain meds and k pad. - would watch her one more day and if the hematoma is not improving, consider ortho consult to see if it has progressed to abscess , then she might need I&D.       Diabetes mellitus, insulin dependent (IDDM), with complications of nephropathy  - reasonable inpatient control  - keep on SSI for now CBG (last 3)   Recent Labs  12/15/15 0755 12/15/15 1239 12/15/15 1702  GLUCAP 110* 116* 109*        Acute hyperkalemia - resolved     Physical deconditioning - PT eval requested  - SNF recommended, work in progress     Left ventricular diastolic dysfunction, NYHA class 1 - monitor weights  - stable 235 - 238 lbs    DVT prophylaxis: Heparin SQ Code Status: Full  Family Communication: none at bedside.  Disposition Plan: SNF  May be in 2 days.   Consultants:   GI  Nephrology   Cardiology   Ortho   Procedures:  CT abd and pelvis ordered.  Antimicrobials:   Doxycycline 7/11 -->  Subjective: Pain better controlled. No nausea or vomiting.   Objective: Filed Vitals:   12/15/15 0415 12/15/15 0756 12/15/15 1252 12/15/15 1701  BP:  89/37 108/29 92/46  Pulse:  83 80 79  Temp:  98.4 F (36.9 C) 98.6 F (37 C) 98.3 F (36.8 C)  TempSrc:  Oral Oral Oral  Resp:  22 18 16   Height:      Weight: 108.7 kg (239 lb 10.2 oz)     SpO2:  96% 97% 97%    Intake/Output Summary (Last 24 hours) at 12/15/15 1842 Last data filed at 12/15/15 1054   Gross per 24 hour  Intake      3 ml  Output      0 ml  Net      3 ml   Filed Weights   12/14/15 0804 12/14/15 1210 12/15/15 0415  Weight: 107.3 kg (236 lb 8.9 oz) 107.2 kg (236 lb 5.3 oz) 108.7 kg (239 lb 10.2 oz)    Examination:  General exam: Appears calm and comfortable, jaundiced sclera  Respiratory system: Respiratory effort normal. Diminished air movement at bases, no wheezing or rhonchi.  Cardiovascular system: S1 & S2 heard, RRR. No rubs, gallops or clicks. No pedal edema. Gastrointestinal system: Abdomen is nondistended, soft and nontender. No organomegaly or masses felt.  Central nervous system: Alert and oriented. No focal neurological deficits. Extremities: Symmetric 5 x 5 power. Left anterior shin area hematoma, slightly TTP and with surrounding erythema, warmth to touch, .  Psychiatry: Judgement and insight appear normal. Mood & affect appropriate.   Data Reviewed: I have personally reviewed following labs and imaging studies  CBC:  Recent Labs Lab 12/11/15 0614 12/12/15 0644 12/13/15 0517 12/14/15 0222 12/15/15 0248  WBC 7.3 8.1 9.0 8.4 9.1  HGB 9.7* 9.2* 9.2* 8.8* 9.0*  HCT 30.8* 29.9* 28.9* 28.5* 29.1*  MCV 79.6 79.9 79.6 80.1 80.6  PLT 152 161 158 173 183   Basic Metabolic Panel:  Recent Labs Lab 12/11/15 0614 12/12/15 0644 12/13/15 0517 12/14/15 0222 12/15/15 0248  NA 133* 134* 135 133* 133*  K 3.8 4.0 3.8 3.7 4.3  CL 96* 97* 99* 97* 99*  CO2 28 27 27 27 26   GLUCOSE 77 88 89 95 102*  BUN 16 26* 9 16 8   CREATININE 4.78* 6.22* 3.23* 4.72* 2.87*  CALCIUM 9.0 8.8* 9.0 9.2 9.1   Liver Function Tests:  Recent Labs Lab 12/11/15 0614 12/12/15 0644 12/13/15 1044 12/14/15 0222 12/15/15 1047  AST 789* 762* 732* 664* 702*  ALT 286* 288* 284* 254* 259*  ALKPHOS 1062* 1087* 1104* 1006* 996*  BILITOT 9.2* 10.1* 10.9* 10.6* 10.7*  PROT 5.9* 5.9* 6.2* 5.7* 6.2*  ALBUMIN 1.6* 1.5* 1.6* 1.5* 1.6*    Recent Labs Lab 12/07/2015 1111  LIPASE 141*      Recent Labs Lab 12/04/2015 1111  AMMONIA 72*   Coagulation Profile:  Recent Labs Lab 12/10/2015 1111 12/10/15 0258 12/11/15 0614 12/14/15 0222  INR 1.37 1.60* 1.47 1.62*   Cardiac Enzymes:  Recent Labs Lab 12/28/2015 1111  12/10/15 0258 12/10/15 1255 12/11/15 1859 12/12/15 0020 12/12/15 0644  CKTOTAL 372*  --   --   --   --   --   --   TROPONINI 0.22*  < > 0.20* 0.26* 0.28* 0.28* 0.27*  < > = values in this interval not displayed. CBG:  Recent Labs Lab 12/14/15 2353 12/15/15 0411 12/15/15 0755 12/15/15 1239 12/15/15 1702  GLUCAP 120* 95 110* 116* 109*  Urine analysis:    Component Value Date/Time   COLORURINE AMBER* 10/05/2015 1229   APPEARANCEUR TURBID* 10/05/2015 1229   LABSPEC 1.020 10/05/2015 1229   PHURINE 5.0 10/05/2015 1229   GLUCOSEU NEGATIVE 10/05/2015 1229   HGBUR LARGE* 10/05/2015 1229   BILIRUBINUR LARGE* 10/05/2015 1229   KETONESUR 15* 10/05/2015 1229   PROTEINUR >300* 10/05/2015 1229   UROBILINOGEN 1.0 02/11/2014 0913   NITRITE NEGATIVE 10/05/2015 1229   LEUKOCYTESUR LARGE* 10/05/2015 1229   Recent Results (from the past 240 hour(s))  Culture, blood (Routine X 2) w Reflex to ID Panel     Status: None   Collection Time: 12/10/15  1:30 PM  Result Value Ref Range Status   Specimen Description BLOOD RIGHT ANTECUBITAL  Final   Special Requests   Final    BOTTLES DRAWN AEROBIC AND ANAEROBIC  10CC AER 5CC ANA   Culture NO GROWTH 5 DAYS  Final   Report Status 12/14/2015 FINAL  Final  Culture, blood (Routine X 2) w Reflex to ID Panel     Status: None   Collection Time: 12-10-2015  1:35 PM  Result Value Ref Range Status   Specimen Description BLOOD RIGHT HAND  Final   Special Requests IN PEDIATRIC BOTTLE  4CC  Final   Culture NO GROWTH 5 DAYS  Final   Report Status 12/14/2015 FINAL  Final  MRSA PCR Screening     Status: None   Collection Time: 12-10-15 10:24 PM  Result Value Ref Range Status   MRSA by PCR NEGATIVE NEGATIVE Final     Comment:        The GeneXpert MRSA Assay (FDA approved for NASAL specimens only), is one component of a comprehensive MRSA colonization surveillance program. It is not intended to diagnose MRSA infection nor to guide or monitor treatment for MRSA infections.   C difficile quick screen w PCR reflex     Status: None   Collection Time: 12/10/15  9:34 PM  Result Value Ref Range Status   C Diff antigen NEGATIVE NEGATIVE Final   C Diff toxin NEGATIVE NEGATIVE Final   C Diff interpretation No C. difficile detected.  Final  Culture, blood (routine x 2)     Status: None (Preliminary result)   Collection Time: 12/14/15  8:10 AM  Result Value Ref Range Status   Specimen Description BLOOD HEMODIALYSIS  Final   Special Requests BOTTLES DRAWN AEROBIC AND ANAEROBIC 10CC  Final   Culture NO GROWTH 1 DAY  Final   Report Status PENDING  Incomplete  Culture, blood (routine x 2)     Status: None (Preliminary result)   Collection Time: 12/14/15  8:40 AM  Result Value Ref Range Status   Specimen Description BLOOD HEMODIALYSIS  Final   Special Requests BOTTLES DRAWN AEROBIC AND ANAEROBIC  10CC  Final   Culture NO GROWTH 1 DAY  Final   Report Status PENDING  Incomplete      Radiology Studies: No results found.    Scheduled Meds: . darbepoetin (ARANESP) injection - DIALYSIS  60 mcg Intravenous Q Tue-HD  . doxycycline  100 mg Oral Q12H  . feeding supplement (NEPRO CARB STEADY)  237 mL Oral BID BM  . heparin  5,000 Units Subcutaneous Q8H  . insulin aspart  0-9 Units Subcutaneous Q4H  . midodrine  10 mg Oral TID WC  . multivitamin  1 tablet Oral Daily  . pantoprazole  40 mg Oral Daily  . senna-docusate  1 tablet Oral BID  . sodium  chloride flush  3 mL Intravenous Q12H   Continuous Infusions:    LOS: 6 days   Time spent: 30 minutes   Petro Talent, MD [facility] Pager [phone]  If 7PM-7AM, please contact night-coverage [email or web address] Password [facility] 12/15/2015, 6:42 PM

## 2015-12-15 NOTE — Progress Notes (Signed)
[facility] ROUNDING NOTE   Subjective:   Interval History: no complaints no issues on dialysis  Objective:  Vital signs in last 24 hours:  Temp:  [96.7 F (35.9 C)-98.7 F (37.1 C)] 98.4 F (36.9 C) (07/14 0756) Pulse Rate:  [71-92] 83 (07/14 0756) Resp:  [13-25] 22 (07/14 0756) BP: (79-107)/(34-97) 89/37 mmHg (07/14 0756) SpO2:  [83 %-100 %] 96 % (07/14 0756) Weight:  [107.2 kg (236 lb 5.3 oz)-108.7 kg (239 lb 10.2 oz)] 108.7 kg (239 lb 10.2 oz) (07/14 0415)  Weight change: -1.6 kg (-3 lb 8.4 oz) Filed Weights   12/14/15 0804 12/14/15 1210 12/15/15 0415  Weight: 107.3 kg (236 lb 8.9 oz) 107.2 kg (236 lb 5.3 oz) 108.7 kg (239 lb 10.2 oz)    Intake/Output: I/O last 3 completed shifts: In: 3 [I.V.:3] Out: 748 [Other:748]   Intake/Output this shift:     Alert and orientated CVS RRR RS  Clear  AS soft no ascites Ext no edema    Basic Metabolic Panel:  Recent Labs Lab 12/11/15 0614 12/12/15 0644 12/13/15 0517 12/14/15 0222 12/15/15 0248  NA 133* 134* 135 133* 133*  K 3.8 4.0 3.8 3.7 4.3  CL 96* 97* 99* 97* 99*  CO2 28 27 27 27 26   GLUCOSE 77 88 89 95 102*  BUN 16 26* 9 16 8   CREATININE 4.78* 6.22* 3.23* 4.72* 2.87*  CALCIUM 9.0 8.8* 9.0 9.2 9.1    Liver Function Tests:  Recent Labs Lab 12/10/15 0258 12/11/15 0614 12/12/15 0644 12/13/15 1044 12/14/15 0222  AST 772* 789* 762* 732* 664*  ALT 290* 286* 288* 284* 254*  ALKPHOS 1058* 1062* 1087* 1104* 1006*  BILITOT 8.3* 9.2* 10.1* 10.9* 10.6*  PROT 6.0* 5.9* 5.9* 6.2* 5.7*  ALBUMIN 1.7* 1.6* 1.5* 1.6* 1.5*    Recent Labs Lab 12-15-15 1111  LIPASE 141*    Recent Labs Lab Dec 15, 2015 1111  AMMONIA 72*    CBC:  Recent Labs Lab 12/11/15 0614 12/12/15 0644 12/13/15 0517 12/14/15 0222 12/15/15 0248  WBC 7.3 8.1 9.0 8.4 9.1  HGB 9.7* 9.2* 9.2* 8.8* 9.0*  HCT 30.8* 29.9* 28.9* 28.5* 29.1*  MCV 79.6 79.9 79.6 80.1 80.6  PLT 152 161 158 173 183    Cardiac  Enzymes:  Recent Labs Lab 2015/12/15 1111  12/10/15 0258 12/10/15 1255 12/11/15 1859 12/12/15 0020 12/12/15 0644  CKTOTAL 372*  --   --   --   --   --   --   TROPONINI 0.22*  < > 0.20* 0.26* 0.28* 0.28* 0.27*  < > = values in this interval not displayed.  BNP: Invalid input(s): POCBNP  CBG:  Recent Labs Lab 12/14/15 1636 12/14/15 1953 12/14/15 2353 12/15/15 0411 12/15/15 0755  GLUCAP 165* 116* 120* 95 110*    Microbiology: Results for orders placed or performed during the hospital encounter of 12/15/2015  Culture, blood (Routine X 2) w Reflex to ID Panel     Status: None   Collection Time: 12/15/2015  1:30 PM  Result Value Ref Range Status   Specimen Description BLOOD RIGHT ANTECUBITAL  Final   Special Requests   Final    BOTTLES DRAWN AEROBIC AND ANAEROBIC  10CC AER 5CC ANA   Culture NO GROWTH 5 DAYS  Final   Report Status 12/14/2015 FINAL  Final  Culture, blood (Routine X 2) w Reflex to ID Panel     Status: None   Collection Time: 2015-12-15  1:35 PM  Result Value Ref Range  Status   Specimen Description BLOOD RIGHT HAND  Final   Special Requests IN PEDIATRIC BOTTLE  4CC  Final   Culture NO GROWTH 5 DAYS  Final   Report Status 12/14/2015 FINAL  Final  MRSA PCR Screening     Status: None   Collection Time: 13-Aug-2015 10:24 PM  Result Value Ref Range Status   MRSA by PCR NEGATIVE NEGATIVE Final    Comment:        The GeneXpert MRSA Assay (FDA approved for NASAL specimens only), is one component of a comprehensive MRSA colonization surveillance program. It is not intended to diagnose MRSA infection nor to guide or monitor treatment for MRSA infections.   C difficile quick screen w PCR reflex     Status: None   Collection Time: 12/10/15  9:34 PM  Result Value Ref Range Status   C Diff antigen NEGATIVE NEGATIVE Final   C Diff toxin NEGATIVE NEGATIVE Final   C Diff interpretation No C. difficile detected.  Final    Coagulation Studies:  Recent Labs   12/14/15 0222  LABPROT 19.3*  INR 1.62*    Urinalysis: No results for input(s): COLORURINE, LABSPEC, PHURINE, GLUCOSEU, HGBUR, BILIRUBINUR, KETONESUR, PROTEINUR, UROBILINOGEN, NITRITE, LEUKOCYTESUR in the last 72 hours.  Invalid input(s): APPERANCEUR    Imaging: No results found.   Medications:     . darbepoetin (ARANESP) injection - DIALYSIS  60 mcg Intravenous Q Tue-HD  . doxycycline  100 mg Oral Q12H  . feeding supplement (NEPRO CARB STEADY)  237 mL Oral BID BM  . heparin  5,000 Units Subcutaneous Q8H  . insulin aspart  0-9 Units Subcutaneous Q4H  . midodrine  10 mg Oral TID WC  . multivitamin  1 tablet Oral Daily  . pantoprazole  40 mg Oral Daily  . senna-docusate  1 tablet Oral BID  . sodium chloride flush  3 mL Intravenous Q12H   HYDROcodone-acetaminophen, ondansetron **OR** ondansetron (ZOFRAN) IV, perflutren lipid microspheres (DEFINITY) IV suspension  Assessment/ Plan:  1. Jaundice/ ^LFT's - no biliary obstruction on US. /ANA/SPEPunremarkable- labs trending down but BP still lower than usual Cardiology has seen, no signs of CHF to suggest liver congestion. Agree.  2. ESRD HD tts - K 3.7 - Use 4 k bath today - UF as able - not eating; start keeping even then increase goal to 1L or more if BP allows as it seems to be coming up. 3 Chronic hypotension/volume on midodrine- her BP is much lower here than outpt center - pre and post BPs run 120 - 140 +/- range- transferred to [facility] last evening due to low BP pre HD wt 108.6 goal on HD 2 L as BP allows (CXR 7/10 showed some volume) Net UF 850 on Tuesday; pre BP 60s - recheck then in 90s - plan start with 1 L goal and bump up if BP remains stable; if BP drops try albumin- have increased midodrine to 10 mg tid.  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 Abnormal troponin - per cards 11. ID - BC 7/8 no growth - recheck today do to persistent low BP  12. Left shin hematoma - softer - surrounding erythema - not sure if hematoma worse or secondary to K pad - d/w primary - she will ask ortho to see 13. Failure-to-thrive     LOS: 6 Katherine [name] W  :39 AM

## 2015-12-15 NOTE — Progress Notes (Signed)
PATIENT ID:  Katherine [name] is an 74F with ESRD, hypertension, hyperlipidemia, and diabetes here with shock liver in the setting of diarrhea and hypotension  SUBJECTIVE:  Feeling tired.   PHYSICAL EXAM Filed Vitals:   12/15/15 0300 12/15/15 0414 12/15/15 0415 12/15/15 0756  BP: 82/56 92/45  89/37  Pulse: 86 92  83  Temp:  98.4 F (36.9 C)  98.4 F (36.9 C)  TempSrc:  Oral  Oral  Resp: Height:      Weight:   239 lb 10.2 oz (108.7 kg)   SpO2: 98% 98%  96%   General:  Ill-appearing.  No acute distress 3 Neck: No JVD Lungs:  CTAB anteriorly  Heart:  RRR.  II/VI systolic murmur.  No r/g.  Normal S1/S2 Abdomen:  Soft, NT.  +BS Extremities:  No edema.  L LE hematoma with overlying echar  LABS: Lab Results  Component Value Date   TROPONINI 0.27* 12/12/2015   Results for orders placed or performed during the hospital encounter of 12/29/2015 (from the past 24 hour(s))  Glucose, capillary     Status: None   Collection Time: 12/14/15 12:57 PM  Result Value Ref Range   Glucose-Capillary 69 65 - 99 mg/dL  Glucose, capillary     Status: None   Collection Time: 12/14/15  1:55 PM  Result Value Ref Range   Glucose-Capillary 94 65 - 99 mg/dL  Glucose, capillary     Status: Abnormal   Collection Time: 12/14/15  4:36 PM  Result Value Ref Range   Glucose-Capillary 165 (H) 65 - 99 mg/dL  Glucose, capillary     Status: Abnormal   Collection Time: 12/14/15  7:53 PM  Result Value Ref Range   Glucose-Capillary 116 (H) 65 - 99 mg/dL  Glucose, capillary     Status: Abnormal   Collection Time: 12/14/15 11:53 PM  Result Value Ref Range   Glucose-Capillary 120 (H) 65 - 99 mg/dL  CBC     Status: Abnormal   Collection Time: 12/15/15  2:48 AM  Result Value Ref Range   WBC 9.1 4.0 - 10.5 K/uL   RBC 3.61 (L) 3.87 - 5.11 MIL/uL   Hemoglobin 9.0 (L) 12.0 - 15.0 g/dL   HCT 91.4 (L) 78.2 - 95.6 %   MCV 80.6 78.0 - 100.0 fL   MCH 24.9 (L) 26.0 - 34.0 pg   MCHC 30.9 30.0 - 36.0 g/dL   RDW  21.3 (H) 08.6 - 15.5 %   Platelets 183 150 - 400 K/uL  Basic metabolic panel     Status: Abnormal   Collection Time: 12/15/15  2:48 AM  Result Value Ref Range   Sodium 133 (L) 135 - 145 mmol/L   Potassium 4.3 3.5 - 5.1 mmol/L   Chloride 99 (L) 101 - 111 mmol/L   CO2 26 22 - 32 mmol/L   Glucose, Bld 102 (H) 65 - 99 mg/dL   BUN 8 6 - 20 mg/dL   Creatinine, Ser 5.78 (H) 0.44 - 1.00 mg/dL   Calcium 9.1 8.9 - 46.9 mg/dL   GFR calc non Af Amer 14 (L) >60 mL/min   GFR calc Af Amer 17 (L) >60 mL/min   Anion gap 8 5 - 15  Glucose, capillary     Status: None   Collection Time: 12/15/15  4:11 AM  Result Value Ref Range   Glucose-Capillary 95 65 - 99 mg/dL  Glucose, capillary     Status: Abnormal   Collection  Time: 12/15/15  7:55 AM  Result Value Ref Range   Glucose-Capillary 110 (H) 65 - 99 mg/dL    Intake/Output Summary (Last 24 hours) at 12/15/15 1104 Last data filed at 12/14/15 1336  Gross per 24 hour  Intake      3 ml  Output    748 ml  Net   -745 ml   Echo 12/15/15: Study Conclusions  - Left ventricle: The cavity size was normal. Wall thickness was  increased in a pattern of mild LVH. Systolic function was normal.  The estimated ejection fraction was in the range of 60% to 65%.  Wall motion was normal; there were no regional wall motion  abnormalities. Doppler parameters are consistent with abnormal  left ventricular relaxation (grade 1 diastolic dysfunction). - Right atrium: The atrium was mildly dilated.  ASSESSMENT AND PLAN:  Principal Problem:   Obstructive jaundice Active Problems:   HTN (hypertension)   Dyslipidemia   Morbid obesity (HCC)   ESRD on dialysis (HCC)   Left calf hematoma   Diabetes mellitus, insulin dependent (IDDM), uncontrolled (HCC)   Acute hyperkalemia   Elevated troponin   Physical deconditioning   Left ventricular diastolic dysfunction, NYHA class 1   Autonomic dysfunction   ESRD (end stage renal disease) (HCC)   Diarrhea    Dyspnea   Arterial hypotension   Elevated liver function tests   # Elevated troponin: Peaked at 0.28.  Pattern is most consistent with demand ischemia.  She denies any chest pain or shortness of breath.  Echo shows normal systolic function and grade 1 diastolic dysfunction.    # Hypotension: BP remains low so she was started on midodrine.  # Transaminitis: Etiology unclear.  Non-obstructive and labs are not consistent with autoimmune hepatitis or congestive hepatopathy.  No heart failure noted on echo and RA pressures are normal.  No IVC dilated on my independent review of her echo.     Panzy Bubeck C. Duke Salviaandolph, MD, [facility] 12/15/2015 11:04 AM

## 2015-12-16 DIAGNOSIS — I519 Heart disease, unspecified: Secondary | ICD-10-CM

## 2015-12-16 DIAGNOSIS — Z992 Dependence on renal dialysis: Secondary | ICD-10-CM

## 2015-12-16 DIAGNOSIS — N186 End stage renal disease: Secondary | ICD-10-CM

## 2015-12-16 DIAGNOSIS — T148 Other injury of unspecified body region: Secondary | ICD-10-CM

## 2015-12-16 DIAGNOSIS — E1022 Type 1 diabetes mellitus with diabetic chronic kidney disease: Secondary | ICD-10-CM

## 2015-12-16 DIAGNOSIS — R5381 Other malaise: Secondary | ICD-10-CM

## 2015-12-16 DIAGNOSIS — T148XXA Other injury of unspecified body region, initial encounter: Secondary | ICD-10-CM | POA: Insufficient documentation

## 2015-12-16 DIAGNOSIS — G909 Disorder of the autonomic nervous system, unspecified: Secondary | ICD-10-CM

## 2015-12-16 DIAGNOSIS — E1065 Type 1 diabetes mellitus with hyperglycemia: Secondary | ICD-10-CM

## 2015-12-16 LAB — COMPREHENSIVE METABOLIC PANEL WITH GFR
ALT: 239 U/L — ABNORMAL HIGH (ref 14–54)
AST: 660 U/L — ABNORMAL HIGH (ref 15–41)
Albumin: 1.5 g/dL — ABNORMAL LOW (ref 3.5–5.0)
Alkaline Phosphatase: 998 U/L — ABNORMAL HIGH (ref 38–126)
Anion gap: 11 (ref 5–15)
BUN: 16 mg/dL (ref 6–20)
CO2: 25 mmol/L (ref 22–32)
Calcium: 9.7 mg/dL (ref 8.9–10.3)
Chloride: 100 mmol/L — ABNORMAL LOW (ref 101–111)
Creatinine, Ser: 4.35 mg/dL — ABNORMAL HIGH (ref 0.44–1.00)
GFR calc Af Amer: 10 mL/min — ABNORMAL LOW (ref >60)
GFR calc non Af Amer: 9 mL/min — ABNORMAL LOW (ref >60)
Glucose, Bld: 92 mg/dL (ref 65–99)
Potassium: 5 mmol/L (ref 3.5–5.1)
Sodium: 136 mmol/L (ref 135–145)
Total Bilirubin: 10.3 mg/dL — ABNORMAL HIGH (ref 0.3–1.2)
Total Protein: 5.9 g/dL — ABNORMAL LOW (ref 6.5–8.1)

## 2015-12-16 LAB — GLUCOSE, CAPILLARY
GLUCOSE-CAPILLARY: 100 mg/dL — AB (ref 65–99)
GLUCOSE-CAPILLARY: 90 mg/dL (ref 65–99)
Glucose-Capillary: 97 mg/dL (ref 65–99)
Glucose-Capillary: 98 mg/dL (ref 65–99)
Glucose-Capillary: 99 mg/dL (ref 65–99)

## 2015-12-16 LAB — CBC
HEMATOCRIT: 28.8 % — AB (ref 36.0–46.0)
HEMOGLOBIN: 8.6 g/dL — AB (ref 12.0–15.0)
MCH: 24.2 pg — ABNORMAL LOW (ref 26.0–34.0)
MCHC: 29.9 g/dL — AB (ref 30.0–36.0)
MCV: 81.1 fL (ref 78.0–100.0)
Platelets: 210 10*3/uL (ref 150–400)
RBC: 3.55 MIL/uL — ABNORMAL LOW (ref 3.87–5.11)
RDW: 20.7 % — ABNORMAL HIGH (ref 11.5–15.5)
WBC: 10.4 10*3/uL (ref 4.0–10.5)

## 2015-12-16 LAB — GAMMA GT: GGT: 332 U/L — ABNORMAL HIGH (ref 7–50)

## 2015-12-16 LAB — AMMONIA: Ammonia: 114 umol/L — ABNORMAL HIGH (ref 9–35)

## 2015-12-16 LAB — LIPASE, BLOOD: LIPASE: 123 U/L — AB (ref 11–51)

## 2015-12-16 MED ORDER — MIDODRINE HCL 5 MG PO TABS
ORAL_TABLET | ORAL | Status: AC
  Administered 2015-12-16: 10 mg via ORAL
  Filled 2015-12-16: qty 2

## 2015-12-16 MED ORDER — ONDANSETRON HCL 4 MG/2ML IJ SOLN
4.0000 mg | Freq: Once | INTRAMUSCULAR | Status: AC
  Administered 2015-12-16: 4 mg via INTRAVENOUS

## 2015-12-16 MED ORDER — DIATRIZOATE MEGLUMINE & SODIUM 66-10 % PO SOLN
ORAL | Status: AC
  Administered 2015-12-16: 17:00:00
  Filled 2015-12-16: qty 30

## 2015-12-16 MED ORDER — MIDODRINE HCL 5 MG PO TABS
ORAL_TABLET | ORAL | Status: AC
  Filled 2015-12-16: qty 2

## 2015-12-16 MED ORDER — PROMETHAZINE HCL 25 MG RE SUPP
12.5000 mg | Freq: Four times a day (QID) | RECTAL | Status: DC | PRN
  Administered 2015-12-17: 12.5 mg via RECTAL
  Filled 2015-12-16: qty 1

## 2015-12-16 MED ORDER — LIDOCAINE HCL (PF) 1 % IJ SOLN: 5.0000 mL | INTRAMUSCULAR | Status: DC | PRN

## 2015-12-16 MED ORDER — HEPARIN SODIUM (PORCINE) 1000 UNIT/ML DIALYSIS: 1000.0000 [IU] | INTRAMUSCULAR | Status: DC | PRN

## 2015-12-16 MED ORDER — PENTAFLUOROPROP-TETRAFLUOROETH EX AERO: 1.0000 "application " | INHALATION_SPRAY | CUTANEOUS | Status: DC | PRN

## 2015-12-16 MED ORDER — SODIUM CHLORIDE 0.9 % IV SOLN: 100.0000 mL | INTRAVENOUS | Status: DC | PRN

## 2015-12-16 MED ORDER — PROMETHAZINE HCL 25 MG PO TABS: 12.5000 mg | ORAL_TABLET | Freq: Four times a day (QID) | ORAL | Status: DC | PRN

## 2015-12-16 MED ORDER — LIDOCAINE-PRILOCAINE 2.5-2.5 % EX CREA: 1.0000 "application " | TOPICAL_CREAM | CUTANEOUS | Status: DC | PRN

## 2015-12-16 MED ORDER — PROMETHAZINE HCL 25 MG/ML IJ SOLN
12.5000 mg | Freq: Four times a day (QID) | INTRAMUSCULAR | Status: DC | PRN
  Administered 2015-12-16: 12.5 mg via INTRAVENOUS
  Filled 2015-12-16: qty 1

## 2015-12-16 MED ORDER — MORPHINE SULFATE (PF) 2 MG/ML IV SOLN
2.0000 mg | INTRAVENOUS | Status: DC | PRN
  Administered 2015-12-16 – 2015-12-18 (×6): 2 mg via INTRAVENOUS
  Filled 2015-12-16 (×6): qty 1

## 2015-12-16 MED ORDER — OXYCODONE HCL 5 MG PO TABS
5.0000 mg | ORAL_TABLET | Freq: Once | ORAL | Status: AC
  Administered 2015-12-16: 5 mg via ORAL
  Filled 2015-12-16: qty 1

## 2015-12-16 MED ORDER — ALTEPLASE 2 MG IJ SOLR: 2.0000 mg | Freq: Once | INTRAMUSCULAR | Status: DC | PRN

## 2015-12-16 MED ORDER — HYDROCODONE-ACETAMINOPHEN 5-325 MG PO TABS
ORAL_TABLET | ORAL | Status: AC
  Administered 2015-12-16: 1 via ORAL
  Filled 2015-12-16: qty 1

## 2015-12-16 MED ORDER — ONDANSETRON HCL 4 MG/2ML IJ SOLN
INTRAMUSCULAR | Status: AC
  Administered 2015-12-16: 4 mg via INTRAVENOUS
  Filled 2015-12-16: qty 2

## 2015-12-16 NOTE — Progress Notes (Signed)
Patient has only been on dialysis for 10 weeks and has only 1 kidney, we need to vary with day time Internal Medicine Physician  if IV contrast is warrented.  If IV contrast is warrented then we need to obtain permission from patient's Nephrologist.  Once we speak to all doctors involved we will have the patient drink the oral contrast and proceed with the study.

## 2015-12-16 NOTE — Progress Notes (Signed)
PROGRESS NOTE    Katherine LauthClara P Wiatrek  ZOX:[id number] DOB: 15-Mar-1934 DOA: 12/17/2015 PCP: Enrique SackGREEN, EDWIN JAY, MD    Brief Narrative:  82 y.o. Female with ESRD on HD, DM type II, [facility] and in process of [facility] to [facility]F, [facility] 1-2 weeks duration of diarrhea and had blood work notable for elevated liver enzymes, mixed cholestatic and hepatocellular pattern. Bilirubin is 10, alkaline phosphatase 1200, transaminases in the 300-800 range. Platelets are normal at 169,000, and INR is 1.37. For comparison, 2 months ago the patient's liver chemistries were completely normal and her INR at that time was 1.03.  An abdominal ultrasound notable for significant hepatic steatosis, status post cholecystectomy, 8 mm common bile duct consistent with prior cholecystectomy state but no intrahepatic biliary ductal dilatation, no space-occupying lesions of the liver.   Assessment & Plan:   New Onset elevation of LFTs, with obstructive jaundice - Reviewed previous notes, including GI notes - per notes, AMA negative - though I couldn't find the actual lab, they do not think liver biopsy would be appropriate given co-morbidities - I discussed the possibility of CT scan with Nephro - Dr. Hyman HopesWebb, will get CT abdomen with PO and IV contrast today - LFTs very mildly trended down, but mostly stabilized - DDx includes PBC, PSC (presented with diarrhea), mass in the pancreas or collecting ducts, stricture.  Hopefully CT scan will shed some light on the situation  Abdominal hernia - Noted on exam today, she is not sure how long she has had it - CT abdomen today  Hypotension with h/o Autonomic dysfunction - This is a chronic issue for her and worsened since being here.   - Midodrine increased and her BP is slowly improving - Still with SBP in the 70s to 80s at time, so will continue with SDU at least today  Left calf hematoma - Unclear cause, seems stable toady, bulla on top of  hematoma is collapsed - Pain control with oxycodone, stop vicoden given should avoid tylenol at this time.  Add Morphine PRN - H/H are stable over the last few days, no significant blood loss - INR elevated from baseline, possibly due to liver dysfunction - ? Nutrition status, albumin very low, possibility of vitamin K deficiency should be considered - Repeat INR in the AM, CMET.  If continues to be elevated will try supplemental vitamin K - 7/11 CT of the leg reviewed, noted hematoma - Consider ortho consult, but erythema is improving on doxycycline and bulla is now burst - hard to know if worsening since this is the first time I am seeing her.  Have reviewed Nephrology note from yesterday and Dr. Hyman HopesWebb noted that the site was softer.  Will continue to monitor.  - Continue doxycycline for possible super-infection  Physical deconditioning and morbid obesity - PT evaluation from 7/12 recommended SNF, follow up after acute issues above addressed    ESRD on dialysis - Nephrology following - HD today  Diabetes mellitus, insulin dependent (IDDM), uncontrolled  - glucose in the 90s-100s - Continue SSI    Acute hyperkalemia - Resolved, but up to 5.0 today - BMET in the morning, continue to monitor.   Left ventricular diastolic dysfunction - There was initially concern for hepatic congestion and CHF, cardiology was consulted but TTE only showed grade 1 diastolic dysfunction.  There was no concern for congestion - Cardiology has signed off.      DVT prophylaxis: Heparin SQ, given hematoma, could  consider stopping if worsening Code Status: Full Disposition Plan: Pending above work up, anticipate 2-3 more days inpatient   Consultants:   Nephrology  Cardiology - signed off  Gastroenterology  PT  Procedures:   Ongoing HD  Antimicrobials: (specify start and planned stop date. Auto populated tables are space occupying and do not give end dates)  Doxycycline 7/11 -->  current   Subjective: Patient reports that she is pain from her shin hematoma.  She otherwise is doing well.  She is not clear on when her abdominal hernia started.   Objective: Filed Vitals:   12/16/15 0745 12/16/15 0804 12/16/15 0805 12/16/15 0828  BP: 116/51 109/51 109/51 104/47  Pulse: 84 76 82 90  Temp: 98.2 F (36.8 C)     TempSrc: Oral     Resp: 27 24 22 24   Height:      Weight:      SpO2:  100% 100% 100%    Intake/Output Summary (Last 24 hours) at 12/16/15 0856 Last data filed at 12/15/15 2122  Gross per 24 hour  Intake    106 ml  Output      0 ml  Net    106 ml   Filed Weights   12/14/15 1210 12/15/15 0415 12/16/15 0404  Weight: 236 lb 5.3 oz (107.2 kg) 239 lb 10.2 oz (108.7 kg) 238 lb 1.6 oz (108 kg)    Examination:  General exam: Appears calm and comfortable, had emesis just prior to me seeing her.  Respiratory system: Clear to auscultation. Respiratory effort normal. Cardiovascular system: S1 & S2 heard, RRNR. No murmurs Gastrointestinal system: Abdomen is nondistended, soft and nontender. She has a tense egg shaped mass in the periumbilical region, under a well healed midline abdominal scar.  Non reducible.  This is non tender to palpation.  About 6 inches X 3 inches.  Central nervous system: Alert and oriented. No focal neurological deficits. Extremities: She has a large pre-tibial hematoma on the left leg which is tense, has a burst bulla on the lateral edge.  Size is about 6 inches X 3 inches.  Skin: No other rashes or wounds noted.  Psychiatry: Judgement and insight appear normal. Mood & affect appropriate. She is somewhat lethargic.     Data Reviewed:   CBC:  Recent Labs Lab 12/12/15 0644 12/13/15 0517 12/14/15 0222 12/15/15 0248 12/16/15 0334  WBC 8.1 9.0 8.4 9.1 10.4  HGB 9.2* 9.2* 8.8* 9.0* 8.6*  HCT 29.9* 28.9* 28.5* 29.1* 28.8*  MCV 79.9 79.6 80.1 80.6 81.1  PLT 161 158 173 183 210   Basic Metabolic Panel:  Recent Labs Lab  12/12/15 0644 12/13/15 0517 12/14/15 0222 12/15/15 0248 12/16/15 0334  NA 134* 135 133* 133* 136  K 4.0 3.8 3.7 4.3 5.0  CL 97* 99* 97* 99* 100*  CO2 27 27 27 26 25   GLUCOSE 88 89 95 102* 92  BUN 26* 9 16 8 16   CREATININE 6.22* 3.23* 4.72* 2.87* 4.35*  CALCIUM 8.8* 9.0 9.2 9.1 9.7   GFR: Estimated Creatinine Clearance: 12.4 mL/min (by C-G formula based on Cr of 4.35). Liver Function Tests:  Recent Labs Lab 12/12/15 0644 12/13/15 1044 12/14/15 0222 12/15/15 1047 12/16/15 0334  AST 762* 732* 664* 702* 660*  ALT 288* 284* 254* 259* 239*  ALKPHOS 1087* 1104* 1006* 996* 998*  BILITOT 10.1* 10.9* 10.6* 10.7* 10.3*  PROT 5.9* 6.2* 5.7* 6.2* 5.9*  ALBUMIN 1.5* 1.6* 1.5* 1.6* 1.5*    Recent Labs Lab Dec 27, 2015 1111  12/16/15 0334  LIPASE 141* 123*    Recent Labs Lab 12/12/15 1111 12/16/15 0339  AMMONIA 72* 114*   Coagulation Profile:  Recent Labs Lab 12/12/15 1111 12/10/15 0258 12/11/15 0614 12/14/15 0222  INR 1.37 1.60* 1.47 1.62*   Cardiac Enzymes:  Recent Labs Lab December 12, 2015 1111  12/10/15 0258 12/10/15 1255 12/11/15 1859 12/12/15 0020 12/12/15 0644  CKTOTAL 372*  --   --   --   --   --   --   TROPONINI 0.22*  < > 0.20* 0.26* 0.28* 0.28* 0.27*  < > = values in this interval not displayed. CBG:  Recent Labs Lab 12/15/15 1239 12/15/15 1702 12/15/15 2001 12/15/15 2342 12/16/15 0403  GLUCAP 116* 109* 122* 98 100*     Recent Results (from the past 240 hour(s))  Culture, blood (Routine X 2) w Reflex to ID Panel     Status: None   Collection Time: 12/12/2015  1:30 PM  Result Value Ref Range Status   Specimen Description BLOOD RIGHT ANTECUBITAL  Final   Special Requests   Final    BOTTLES DRAWN AEROBIC AND ANAEROBIC  10CC AER 5CC ANA   Culture NO GROWTH 5 DAYS  Final   Report Status 12/14/2015 FINAL  Final  Culture, blood (Routine X 2) w Reflex to ID Panel     Status: None   Collection Time: 12-Dec-2015  1:35 PM  Result Value Ref Range Status    Specimen Description BLOOD RIGHT HAND  Final   Special Requests IN PEDIATRIC BOTTLE  4CC  Final   Culture NO GROWTH 5 DAYS  Final   Report Status 12/14/2015 FINAL  Final  MRSA PCR Screening     Status: None   Collection Time: 12/12/15 10:24 PM  Result Value Ref Range Status   MRSA by PCR NEGATIVE NEGATIVE Final    Comment:        The GeneXpert MRSA Assay (FDA approved for NASAL specimens only), is one component of a comprehensive MRSA colonization surveillance program. It is not intended to diagnose MRSA infection nor to guide or monitor treatment for MRSA infections.   C difficile quick screen w PCR reflex     Status: None   Collection Time: 12/10/15  9:34 PM  Result Value Ref Range Status   C Diff antigen NEGATIVE NEGATIVE Final   C Diff toxin NEGATIVE NEGATIVE Final   C Diff interpretation No C. difficile detected.  Final  Culture, blood (routine x 2)     Status: None (Preliminary result)   Collection Time: 12/14/15  8:10 AM  Result Value Ref Range Status   Specimen Description BLOOD HEMODIALYSIS  Final   Special Requests BOTTLES DRAWN AEROBIC AND ANAEROBIC 10CC  Final   Culture NO GROWTH 1 DAY  Final   Report Status PENDING  Incomplete  Culture, blood (routine x 2)     Status: None (Preliminary result)   Collection Time: 12/14/15  8:40 AM  Result Value Ref Range Status   Specimen Description BLOOD HEMODIALYSIS  Final   Special Requests BOTTLES DRAWN AEROBIC AND ANAEROBIC  10CC  Final   Culture NO GROWTH 1 DAY  Final   Report Status PENDING  Incomplete         Radiology Studies: No results found.      Scheduled Meds: . darbepoetin (ARANESP) injection - DIALYSIS  60 mcg Intravenous Q Tue-HD  . doxycycline  100 mg Oral Q12H  . feeding supplement (NEPRO CARB STEADY)  237 mL Oral BID  BM  . heparin  5,000 Units Subcutaneous Q8H  . insulin aspart  0-9 Units Subcutaneous Q4H  . midodrine      . midodrine  10 mg Oral TID WC  . multivitamin  1 tablet Oral Daily   . ondansetron      . ondansetron (ZOFRAN) IV  4 mg Intravenous Once  . pantoprazole  40 mg Oral Daily  . senna-docusate  1 tablet Oral BID  . sodium chloride flush  3 mL Intravenous Q12H   Continuous Infusions:    LOS: 7 days    Time spent: 35    Debe Coder, MD [facility] Pager [phone]  If 7PM-7AM, please contact night-coverage [email or web address] Password [facility] 12/16/2015, 8:56 AM

## 2015-12-16 NOTE — Progress Notes (Signed)
Reviewed chart.  I agree that CT abdomen with PO/IV contrast is the next best step in determining patients underlying diagnosis for her transaminitis.  Have discussed case with Dr. Hyman HopesWebb, Nephrology, and it is okay to give her IV contrast at this time.  Also discussed with CT team.  Patient will have her CT after dialysis today.   Debe CoderMULLEN, Leisha Trinkle, MD

## 2015-12-16 NOTE — Procedures (Signed)
I have seen and examined this patient and agree with the plan of care  Pain from hematoma site on left leg  Ca 9.7  Alb 1.5  No vitamin D or calcium based binders   Will follow   Hb 8.6    Ongoing concerns about etiology of increased liver enzymes   Appears to be tolerating dialysis this morning Lynleigh Kovack W 12/16/2015, 8:25 AM

## 2015-12-16 NOTE — Progress Notes (Signed)
Called Ct to see if patient was to drink contrast placed at bedside. Kim in CT informed writer to continue the hold on the contrast until CT department speak with Nephrologist this morning to clear a few questions concerning patient's kidney function. Will endorse.

## 2015-12-17 ENCOUNTER — Encounter (HOSPITAL_COMMUNITY): Payer: Self-pay | Admitting: Radiology

## 2015-12-17 ENCOUNTER — Inpatient Hospital Stay (HOSPITAL_COMMUNITY): Payer: Medicare Other

## 2015-12-17 DIAGNOSIS — E875 Hyperkalemia: Secondary | ICD-10-CM

## 2015-12-17 DIAGNOSIS — R531 Weakness: Secondary | ICD-10-CM | POA: Insufficient documentation

## 2015-12-17 LAB — COMPREHENSIVE METABOLIC PANEL
ALBUMIN: 1.7 g/dL — AB (ref 3.5–5.0)
ALK PHOS: 1062 U/L — AB (ref 38–126)
ALK PHOS: 1011 U/L — AB (ref 38–126)
ALT: 264 U/L — AB (ref 14–54)
ALT: 280 U/L — AB (ref 14–54)
ANION GAP: 14 (ref 5–15)
ANION GAP: 14 (ref 5–15)
AST: 778 U/L — ABNORMAL HIGH (ref 15–41)
AST: 804 U/L — ABNORMAL HIGH (ref 15–41)
Albumin: 1.5 g/dL — ABNORMAL LOW (ref 3.5–5.0)
BILIRUBIN TOTAL: 10.8 mg/dL — AB (ref 0.3–1.2)
BUN: 11 mg/dL (ref 6–20)
BUN: 14 mg/dL (ref 6–20)
CALCIUM: 9.4 mg/dL (ref 8.9–10.3)
CALCIUM: 9.1 mg/dL (ref 8.9–10.3)
CHLORIDE: 96 mmol/L — AB (ref 101–111)
CO2: 24 mmol/L (ref 22–32)
CO2: 20 mmol/L — AB (ref 22–32)
CREATININE: 2.81 mg/dL — AB (ref 0.44–1.00)
Chloride: 95 mmol/L — ABNORMAL LOW (ref 101–111)
Creatinine, Ser: 2.73 mg/dL — ABNORMAL HIGH (ref 0.44–1.00)
GFR calc Af Amer: 18 mL/min — ABNORMAL LOW (ref >60)
GFR calc non Af Amer: 15 mL/min — ABNORMAL LOW (ref >60)
GFR calc non Af Amer: 15 mL/min — ABNORMAL LOW (ref >60)
GFR, EST AFRICAN AMERICAN: 17 mL/min — AB (ref >60)
GLUCOSE: 114 mg/dL — AB (ref 65–99)
GLUCOSE: 127 mg/dL — AB (ref 65–99)
Potassium: 4.7 mmol/L (ref 3.5–5.1)
Potassium: 5.3 mmol/L — ABNORMAL HIGH (ref 3.5–5.1)
SODIUM: 133 mmol/L — AB (ref 135–145)
SODIUM: 130 mmol/L — AB (ref 135–145)
TOTAL PROTEIN: 5.9 g/dL — AB (ref 6.5–8.1)
Total Bilirubin: 10.3 mg/dL — ABNORMAL HIGH (ref 0.3–1.2)
Total Protein: 6.1 g/dL — ABNORMAL LOW (ref 6.5–8.1)

## 2015-12-17 LAB — CBC
HCT: 29 % — ABNORMAL LOW (ref 36.0–46.0)
HCT: 26.6 % — ABNORMAL LOW (ref 36.0–46.0)
HEMOGLOBIN: 8.3 g/dL — AB (ref 12.0–15.0)
Hemoglobin: 9 g/dL — ABNORMAL LOW (ref 12.0–15.0)
MCH: 25.2 pg — AB (ref 26.0–34.0)
MCH: 25.8 pg — AB (ref 26.0–34.0)
MCHC: 31 g/dL (ref 30.0–36.0)
MCHC: 31.2 g/dL (ref 30.0–36.0)
MCV: 81.2 fL (ref 78.0–100.0)
MCV: 82.6 fL (ref 78.0–100.0)
PLATELETS: 252 10*3/uL (ref 150–400)
PLATELETS: ADEQUATE 10*3/uL (ref 150–400)
RBC: 3.57 MIL/uL — ABNORMAL LOW (ref 3.87–5.11)
RBC: 3.22 MIL/uL — ABNORMAL LOW (ref 3.87–5.11)
RDW: 22 % — AB (ref 11.5–15.5)
RDW: 22.1 % — ABNORMAL HIGH (ref 11.5–15.5)
WBC: 13.4 10*3/uL — ABNORMAL HIGH (ref 4.0–10.5)
WBC: 12.8 10*3/uL — ABNORMAL HIGH (ref 4.0–10.5)

## 2015-12-17 LAB — GLUCOSE, CAPILLARY
GLUCOSE-CAPILLARY: 121 mg/dL — AB (ref 65–99)
GLUCOSE-CAPILLARY: 154 mg/dL — AB (ref 65–99)
GLUCOSE-CAPILLARY: 153 mg/dL — AB (ref 65–99)
Glucose-Capillary: 102 mg/dL — ABNORMAL HIGH (ref 65–99)
Glucose-Capillary: 116 mg/dL — ABNORMAL HIGH (ref 65–99)
Glucose-Capillary: 118 mg/dL — ABNORMAL HIGH (ref 65–99)

## 2015-12-17 LAB — PROTIME-INR
INR: 1.5 — AB (ref 0.00–1.49)
PROTHROMBIN TIME: 18.2 s — AB (ref 11.6–15.2)

## 2015-12-17 LAB — LACTIC ACID, PLASMA
LACTIC ACID, VENOUS: 2.2 mmol/L — AB (ref 0.5–1.9)
LACTIC ACID, VENOUS: 5.2 mmol/L — AB (ref 0.5–1.9)
Lactic Acid, Venous: 3.7 mmol/L (ref 0.5–1.9)

## 2015-12-17 LAB — TYPE AND SCREEN
ABO/RH(D): B POS
Antibody Screen: NEGATIVE

## 2015-12-17 MED ORDER — SCOPOLAMINE 1 MG/3DAYS TD PT72
1.0000 | MEDICATED_PATCH | TRANSDERMAL | Status: DC
  Administered 2015-12-17: 1.5 mg via TRANSDERMAL
  Filled 2015-12-17: qty 1

## 2015-12-17 MED ORDER — SODIUM CHLORIDE 0.9 % IV SOLN
INTRAVENOUS | Status: AC
  Administered 2015-12-17: 17:00:00 via INTRAVENOUS

## 2015-12-17 MED ORDER — SODIUM CHLORIDE 0.9 % IV BOLUS (SEPSIS)
500.0000 mL | Freq: Once | INTRAVENOUS | Status: AC
  Administered 2015-12-17: 500 mL via INTRAVENOUS

## 2015-12-17 MED ORDER — SODIUM CHLORIDE 0.9 % IV SOLN: INTRAVENOUS | Status: AC

## 2015-12-17 MED ORDER — VITAMIN K1 10 MG/ML IJ SOLN
1.0000 mg | Freq: Once | INTRAMUSCULAR | Status: AC
  Administered 2015-12-17: 1 mg via INTRAVENOUS
  Filled 2015-12-17: qty 0.1

## 2015-12-17 MED ORDER — IOPAMIDOL (ISOVUE-300) INJECTION 61%
INTRAVENOUS | Status: AC
  Administered 2015-12-17: 75 mL
  Filled 2015-12-17: qty 75

## 2015-12-17 NOTE — Progress Notes (Signed)
Dr. Clyde LundborgNiu in to assess patient since hematoma was noted increasing in the left leg. MD ordered IV blos since SBP 73/42. Patient unable eo swallow any medication without gagging. MD also informed.

## 2015-12-17 NOTE — Progress Notes (Signed)
[facility] ROUNDING NOTE   Subjective:   Interval History:  Lower blood pressure  -- larger hematoma on left leg   Objective:  Vital signs in last 24 hours:  Temp:  [97.6 F (36.4 C)-98.6 F (37 C)] 97.6 F (36.4 C) (07/16 0750) Pulse Rate:  [82-108] 83 (07/16 0750) Resp:  [12-24] 15 (07/16 0750) BP: (69-120)/(34-58) 85/50 mmHg (07/16 0750) SpO2:  [95 %-100 %] 100 % (07/16 0750)  Weight change: 0 kg (0 lb) Filed Weights   12/15/15 0415 12/16/15 0404 12/16/15 0745  Weight: 108.7 kg (239 lb 10.2 oz) 108 kg (238 lb 1.6 oz) 108 kg (238 lb 1.6 oz)    Intake/Output: I/O last 3 completed shifts: In: 509 [I.V.:509] Out: 528 [Other:528]   Intake/Output this shift:     Alert and orientated CVS RRR RS Clear  AS soft no ascites Ext no edema   Basic Metabolic Panel:  Recent Labs Lab 12/14/15 0222 12/15/15 0248 12/16/15 0334 12/17/15 0539 12/17/15 0643  NA 133* 133* 136 133* 130*  K 3.7 4.3 5.0 4.7 5.3*  CL 97* 99* 100* 95* 96*  CO2 27 26 25 24  20*  GLUCOSE 95 102* 92 114* 127*  BUN 16 8 16 11 14   CREATININE 4.72* 2.87* 4.35* 2.81* 2.73*  CALCIUM 9.2 9.1 9.7 9.4 9.1    Liver Function Tests:  Recent Labs Lab 12/14/15 0222 12/15/15 1047 12/16/15 0334 12/17/15 0539 12/17/15 0643  AST 664* 702* 660* 778* 804*  ALT 254* 259* 239* 264* 280*  ALKPHOS 1006* 996* 998* 1062* 1011*  BILITOT 10.6* 10.7* 10.3* 10.8* 10.3*  PROT 5.7* 6.2* 5.9* 5.9* 6.1*  ALBUMIN 1.5* 1.6* 1.5* 1.5* 1.7*    Recent Labs Lab 12/16/15 0334  LIPASE 123*    Recent Labs Lab 12/16/15 0339  AMMONIA 114*    CBC:  Recent Labs Lab 12/14/15 0222 12/15/15 0248 12/16/15 0334 12/17/15 0322 12/17/15 0539  WBC 8.4 9.1 10.4 13.4* PENDING  HGB 8.8* 9.0* 8.6* 9.0* 8.3*  HCT 28.5* 29.1* 28.8* 29.0* 26.6*  MCV 80.1 80.6 81.1 81.2 82.6  PLT 173 183 210 252 PENDING    Cardiac Enzymes:  Recent Labs Lab 12/10/15 1255 12/11/15 1859 12/12/15 0020 12/12/15 0644   TROPONINI 0.26* 0.28* 0.28* 0.27*    BNP: Invalid input(s): POCBNP  CBG:  Recent Labs Lab 12/16/15 1533 12/16/15 2041 12/16/15 2334 12/17/15 0316 12/17/15 0749  GLUCAP 97 99 116* 102* 121*    Microbiology: Results for orders placed or performed during the hospital encounter of 12-16-15  Culture, blood (Routine X 2) w Reflex to ID Panel     Status: None   Collection Time: 12-16-15  1:30 PM  Result Value Ref Range Status   Specimen Description BLOOD RIGHT ANTECUBITAL  Final   Special Requests   Final    BOTTLES DRAWN AEROBIC AND ANAEROBIC  10CC AER 5CC ANA   Culture NO GROWTH 5 DAYS  Final   Report Status 12/14/2015 FINAL  Final  Culture, blood (Routine X 2) w Reflex to ID Panel     Status: None   Collection Time: 12-16-15  1:35 PM  Result Value Ref Range Status   Specimen Description BLOOD RIGHT HAND  Final   Special Requests IN PEDIATRIC BOTTLE  4CC  Final   Culture NO GROWTH 5 DAYS  Final   Report Status 12/14/2015 FINAL  Final  MRSA PCR Screening     Status: None   Collection Time: 2015/12/16 10:24 PM  Result Value  Ref Range Status   MRSA by PCR NEGATIVE NEGATIVE Final    Comment:        The GeneXpert MRSA Assay (FDA approved for NASAL specimens only), is one component of a comprehensive MRSA colonization surveillance program. It is not intended to diagnose MRSA infection nor to guide or monitor treatment for MRSA infections.   C difficile quick screen w PCR reflex     Status: None   Collection Time: 12/10/15  9:34 PM  Result Value Ref Range Status   C Diff antigen NEGATIVE NEGATIVE Final   C Diff toxin NEGATIVE NEGATIVE Final   C Diff interpretation No C. difficile detected.  Final  Culture, blood (routine x 2)     Status: None (Preliminary result)   Collection Time: 12/14/15  8:10 AM  Result Value Ref Range Status   Specimen Description BLOOD HEMODIALYSIS  Final   Special Requests BOTTLES DRAWN AEROBIC AND ANAEROBIC 10CC  Final   Culture NO GROWTH 2  DAYS  Final   Report Status PENDING  Incomplete  Culture, blood (routine x 2)     Status: None (Preliminary result)   Collection Time: 12/14/15  8:40 AM  Result Value Ref Range Status   Specimen Description BLOOD HEMODIALYSIS  Final   Special Requests BOTTLES DRAWN AEROBIC AND ANAEROBIC  10CC  Final   Culture NO GROWTH 2 DAYS  Final   Report Status PENDING  Incomplete  Culture, blood (Routine X 2) w Reflex to ID Panel     Status: None (Preliminary result)   Collection Time: 12/17/15  7:05 AM  Result Value Ref Range Status   Specimen Description BLOOD LEFT ANTECUBITAL  Final   Special Requests IN PEDIATRIC BOTTLE 3CC  Final   Culture PENDING  Incomplete   Report Status PENDING  Incomplete    Coagulation Studies:  Recent Labs  12/17/15 0539  LABPROT 18.2*  INR 1.50*    Urinalysis: No results for input(s): COLORURINE, LABSPEC, PHURINE, GLUCOSEU, HGBUR, BILIRUBINUR, KETONESUR, PROTEINUR, UROBILINOGEN, NITRITE, LEUKOCYTESUR in the last 72 hours.  Invalid input(s): APPERANCEUR    Imaging: No results found.   Medications:     . darbepoetin (ARANESP) injection - DIALYSIS  60 mcg Intravenous Q Tue-HD  . doxycycline  100 mg Oral Q12H  . feeding supplement (NEPRO CARB STEADY)  237 mL Oral BID BM  . insulin aspart  0-9 Units Subcutaneous Q4H  . midodrine  10 mg Oral TID WC  . multivitamin  1 tablet Oral Daily  . pantoprazole  40 mg Oral Daily  . phytonadione (VITAMIN K) IV  1 mg Intravenous Once  . senna-docusate  1 tablet Oral BID  . sodium chloride flush  3 mL Intravenous Q12H   morphine injection, ondansetron **OR** ondansetron (ZOFRAN) IV, promethazine **OR** promethazine **OR** promethazine  Assessment/ Plan:  1. Jaundice/ ^LFT's - no biliary obstruction on US. /ANA/SPEPunremarkable- labs trending down but BP still lower than usual Cardiology has seen, no signs of CHF to suggest liver congestion. Agree.  2. ESRD HD tts - K 5.3  -UF as able - not eating; start  keeping even then increase goal to 1L or more if BP allows as it seems to be coming up. May need dialysis in AM  3 Chronic hypotension/volume on midodrine- her BP is much lower here than outpt center - pre and post BPs run 120 - 140 +/- range- [facility] due to low BP pre HD wt 108.6 goal on HD 2 L as  BP allows (CXR 7/10 showed some volume) Net UF 850 on Tuesday; pre BP 60s - recheck then in 90s - plan start with 1 L goal and bump up if BP remains stable; if BP drops try albumin- have increased midodrine to 10 mg tid.  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 Abnormal troponin - per cards 11. ID - BC 7/8 no growth - recheck today do to persistent low BP  12. Left shin hematoma - softer - surrounding erythema - not sure if hematoma worse or secondary to K pad - d/w primary - THIS SEEMS WORSE AND GEN SURGERY TO SEE 13. Failure-to-thrive   LOS: 8 [place] W  :58 AM
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
## 2015-12-17 NOTE — Progress Notes (Signed)
Pt had episode of contrast extravasation during CT scan of abdomen/pelvis. 15.3 cc contrast estimated. Pt was having a reduced dose of 75 cc because she is a dialysis pt of less than 6 months. Since she already had some contrast, rad agreed to wait 24 hours and do another 75 cc. Pt therefore can be scanned again on the 17th July at 0130

## 2015-12-17 NOTE — Progress Notes (Signed)
CRITICAL VALUE ALERT  Critical value received:  Lactic acid 2.2  Date of notification: 12/17/15  Time of notification:  08:17  Critical value read back:Yes.    Nurse who received alert:  Ladoris GeneNana Doralyn Kirkes  MD notified (1st page):  MD Sharlene MottsMullen E  Time of first page: 8:22  MD notified (2nd page):  Time of second page:  Responding MD:    Time MD responded:

## 2015-12-17 NOTE — Progress Notes (Signed)
PROGRESS NOTE    Katherine [name]  ZOX:[id number] DOB: 10/26/1933 DOA: 12/08/2015 PCP: [name], MD    Brief Narrative:  82 y.o. Female with ESRD on HD, DM type II, resident of [facility] and in process of moving to ALF, presented to [facility] for evaluation of 1-2 weeks duration of diarrhea and had blood work notable for elevated liver enzymes, mixed cholestatic and hepatocellular pattern. Bilirubin is 10, alkaline phosphatase 1200, transaminases in the 300-800 range. Platelets are normal at 169,000, and INR is 1.37. For comparison, 2 months ago the patient's liver chemistries were completely normal and her INR at that time was 1.03.  An abdominal ultrasound notable for significant hepatic steatosis, status post cholecystectomy, 8 mm common bile duct consistent with prior cholecystectomy state but no intrahepatic biliary ductal dilatation, no space-occupying lesions of the liver.   Assessment & Plan:   New Onset elevation of LFTs, with obstructive jaundice - No obvious source, per work up AMA negative, felt initially to be due to shock liver but Cardiac work up was not impressive for CHF.  Possibly related to low systolic pressures and inability to take midodrine.  - Trouble with getting IV contrast yesterday, will attempt CT scan again.  If not able to give IV contrast again, would pursue scan without contrast.  - LFTs stable and elevated - DDx includes PBC, PSC (presented with diarrhea), mass in the pancreas or collecting ducts, stricture.  Hopefully CT scan will shed some light on the situation - Persistent nausea - added phenergan yesterday, zofran continued  Abdominal hernia - CT abdomen pending  Hypotension with h/o Autonomic dysfunction - This is a chronic issue, she was not able to tolerate oral meds yesterday, so BP was low - Phenergan or zofran prior to medications - Lactic acid mildly elevated to 2.2, will trend  Left calf hematoma - Unclear cause, worsened today.  - Pain  control with oxycodone and morphine - H/H are stable, no significant blood loss - INR elevated from baseline, possibly due to liver dysfunction and nutrition status - Vitamin K 1mg  IV today, repeat INR in the AM - 7/11 CT of the leg reviewed, noted hematoma - GS consult today with aspiration of the hematoma performed  - Continue doxycycline for possible super-infection  Physical deconditioning and morbid obesity - PT evaluation from 7/12 recommended SNF, will need follow up once acute issues resolve - Consider palliative care    ESRD on dialysis - Nephrology following - HD yesterday  Diabetes mellitus, insulin dependent (IDDM), uncontrolled  - glucose in the 90s-100s, she is not eating much - Continue SSI    Acute hyperkalemia - Resolved, 4.7 today and then 5.3 on recheck - BMET this afternoon  Left ventricular diastolic dysfunction - There was initially concern for hepatic congestion and CHF, cardiology was consulted but TTE only showed grade 1 diastolic dysfunction.  There was no concern for congestion - Cardiology has signed off.      DVT prophylaxis: SCDs to leg without hematoma Code Status: Full Disposition Plan: Pending above work up, anticipate 2-3 more days inpatient   Consultants:   Nephrology  Cardiology - signed off  Gastroenterology  Gen Surg  PT  Procedures:   Ongoing HD  Aspiration of hematoma on 7/16  Antimicrobials:   Doxycycline 7/11 --> current   Subjective: Patient has persistent nausea and pain from hematoma  Objective: Filed Vitals:   12/17/15 0319 12/17/15 0400 12/17/15 0500 12/17/15 0750  BP: 73/42 75/34 69/51  85/50  Pulse: 82  83 83  Temp: 98.1 F (36.7 C)   97.6 F (36.4 C)  TempSrc: Oral   Oral  Resp: 16 17 16 15   Height:      Weight:      SpO2: 100%  100% 100%    Intake/Output Summary (Last 24 hours) at 12/17/15 1250 Last data filed at 12/17/15 0500  Gross per 24 hour  Intake    503 ml  Output      0 ml  Net     503 ml   Filed Weights   12/15/15 0415 12/16/15 0404 12/16/15 0745  Weight: 239 lb 10.2 oz (108.7 kg) 238 lb 1.6 oz (108 kg) 238 lb 1.6 oz (108 kg)    Examination:  General exam: Lying in bed, answers questions  Respiratory system: Clear to auscultation. Respiratory effort normal. Cardiovascular system: S1 & S2 heard, RR, NR. No murmurs Gastrointestinal system: Abdomen is nondistended, soft and nontender. She has a tense egg shaped mass in the periumbilical region, under a well healed midline abdominal scar.  Non reducible.  Non tender.  About 6 inches X 3 inches.  Unchanged from previous  Central nervous system: Alert and oriented. No focal neurological deficits. Extremities: She has a large pre-tibial hematoma on the left leg which is tense, has a burst bulla on the lateral edge.  Size is about 6 inches X 4 inches and has advanced medially.  More tense today.  Skin: No other rashes or wounds noted.  Psychiatry:  She is somewhat lethargic.     Data Reviewed:   CBC:  Recent Labs Lab 12/14/15 0222 12/15/15 0248 12/16/15 0334 12/17/15 0322 12/17/15 0539  WBC 8.4 9.1 10.4 13.4* 12.8*  HGB 8.8* 9.0* 8.6* 9.0* 8.3*  HCT 28.5* 29.1* 28.8* 29.0* 26.6*  MCV 80.1 80.6 81.1 81.2 82.6  PLT 173 183 210 252 PLATELET CLUMPS NOTED ON SMEAR, COUNT APPEARS ADEQUATE   Basic Metabolic Panel:  Recent Labs Lab 12/14/15 0222 12/15/15 0248 12/16/15 0334 12/17/15 0539 12/17/15 0643  NA 133* 133* 136 133* 130*  K 3.7 4.3 5.0 4.7 5.3*  CL 97* 99* 100* 95* 96*  CO2 27 26 25 24  20*  GLUCOSE 95 102* 92 114* 127*  BUN 16 8 16 11 14   CREATININE 4.72* 2.87* 4.35* 2.81* 2.73*  CALCIUM 9.2 9.1 9.7 9.4 9.1   GFR: Estimated Creatinine Clearance: 19.7 mL/min (by C-G formula based on Cr of 2.73). Liver Function Tests:  Recent Labs Lab 12/14/15 0222 12/15/15 1047 12/16/15 0334 12/17/15 0539 12/17/15 0643  AST 664* 702* 660* 778* 804*  ALT 254* 259* 239* 264* 280*  ALKPHOS 1006* 996* 998*  1062* 1011*  BILITOT 10.6* 10.7* 10.3* 10.8* 10.3*  PROT 5.7* 6.2* 5.9* 5.9* 6.1*  ALBUMIN 1.5* 1.6* 1.5* 1.5* 1.7*    Recent Labs Lab 12/16/15 0334  LIPASE 123*    Recent Labs Lab 12/16/15 0339  AMMONIA 114*   Coagulation Profile:  Recent Labs Lab 12/11/15 0614 12/14/15 0222 12/17/15 0539  INR 1.47 1.62* 1.50*   Cardiac Enzymes:  Recent Labs Lab 12/10/15 1255 12/11/15 1859 12/12/15 0020 12/12/15 0644  TROPONINI 0.26* 0.28* 0.28* 0.27*   CBG:  Recent Labs Lab 12/16/15 2041 12/16/15 2334 12/17/15 0316 12/17/15 0749 12/17/15 1213  GLUCAP 99 116* 102* 121* 154*     Recent Results (from the past 240 hour(s))  Culture, blood (Routine X 2) w Reflex to ID Panel     Status: None   Collection Time: September 06, 2015  1:30 PM  Result Value Ref Range Status   Specimen Description BLOOD RIGHT ANTECUBITAL  Final   Special Requests   Final    BOTTLES DRAWN AEROBIC AND ANAEROBIC  10CC AER 5CC ANA   Culture NO GROWTH 5 DAYS  Final   Report Status 12/14/2015 FINAL  Final  Culture, blood (Routine X 2) w Reflex to ID Panel     Status: None   Collection Time: 12/25/2015  1:35 PM  Result Value Ref Range Status   Specimen Description BLOOD RIGHT HAND  Final   Special Requests IN PEDIATRIC BOTTLE  4CC  Final   Culture NO GROWTH 5 DAYS  Final   Report Status 12/14/2015 FINAL  Final  MRSA PCR Screening     Status: None   Collection Time: 12/08/2015 10:24 PM  Result Value Ref Range Status   MRSA by PCR NEGATIVE NEGATIVE Final    Comment:        The GeneXpert MRSA Assay (FDA approved for NASAL specimens only), is one component of a comprehensive MRSA colonization surveillance program. It is not intended to diagnose MRSA infection nor to guide or monitor treatment for MRSA infections.   C difficile quick screen w PCR reflex     Status: None   Collection Time: 12/10/15  9:34 PM  Result Value Ref Range Status   C Diff antigen NEGATIVE NEGATIVE Final   C Diff toxin NEGATIVE  NEGATIVE Final   C Diff interpretation No C. difficile detected.  Final  Culture, blood (routine x 2)     Status: None (Preliminary result)   Collection Time: 12/14/15  8:10 AM  Result Value Ref Range Status   Specimen Description BLOOD HEMODIALYSIS  Final   Special Requests BOTTLES DRAWN AEROBIC AND ANAEROBIC 10CC  Final   Culture NO GROWTH 2 DAYS  Final   Report Status PENDING  Incomplete  Culture, blood (routine x 2)     Status: None (Preliminary result)   Collection Time: 12/14/15  8:40 AM  Result Value Ref Range Status   Specimen Description BLOOD HEMODIALYSIS  Final   Special Requests BOTTLES DRAWN AEROBIC AND ANAEROBIC  10CC  Final   Culture NO GROWTH 2 DAYS  Final   Report Status PENDING  Incomplete  Culture, blood (Routine X 2) w Reflex to ID Panel     Status: None (Preliminary result)   Collection Time: 12/17/15  7:05 AM  Result Value Ref Range Status   Specimen Description BLOOD LEFT ANTECUBITAL  Final   Special Requests IN PEDIATRIC BOTTLE 3CC  Final   Culture PENDING  Incomplete   Report Status PENDING  Incomplete         Radiology Studies: No results found.      Scheduled Meds: . darbepoetin (ARANESP) injection - DIALYSIS  60 mcg Intravenous Q Tue-HD  . doxycycline  100 mg Oral Q12H  . feeding supplement (NEPRO CARB STEADY)  237 mL Oral BID BM  . insulin aspart  0-9 Units Subcutaneous Q4H  . midodrine  10 mg Oral TID WC  . multivitamin  1 tablet Oral Daily  . pantoprazole  40 mg Oral Daily  . senna-docusate  1 tablet Oral BID  . sodium chloride flush  3 mL Intravenous Q12H   Continuous Infusions:    LOS: 8 days    Time spent: 35    Debe Coder, MD [facility] Pager [phone]  If 7PM-7AM, please contact night-coverage [email or web address] Password [email or web address] 12/17/2015, 12:50 PM

## 2015-12-17 NOTE — Progress Notes (Signed)
Patient's IV infiltrated in CT while administering IV contrast. IV removed. Ice applied, and extremity elevated. MD assess in CT prior to returning to 2 central. Will need to attempt CT with contrast in 24hours.

## 2015-12-17 NOTE — Progress Notes (Signed)
[facility] Progress Note  Subjective: No abdominal pain. Liver enzymes remain elevated. CT scan of abdomen pending.  Objective: Vital signs in last 24 hours: Temp:  [97.6 F (36.4 C)-98.6 F (37 C)] 97.6 F (36.4 C) (07/16 0750) Pulse Rate:  [82-97] 83 (07/16 0750) Resp:  [12-24] 15 (07/16 0750) BP: (69-120)/(34-58) 85/50 mmHg (07/16 0750) SpO2:  [95 %-100 %] 100 % (07/16 0750) Weight change: 0 kg (0 lb)   PE:  No distress  Abdomen nontender  Lab Results: Results for orders placed or performed during the hospital encounter of 2015/12/11 (from the past 24 hour(s))  Gamma GT     Status: Abnormal   Collection Time: 12/16/15  1:09 PM  Result Value Ref Range   GGT 332 (H) 7 - 50 U/L  Glucose, capillary     Status: None   Collection Time: 12/16/15  2:35 PM  Result Value Ref Range   Glucose-Capillary 90 65 - 99 mg/dL  Glucose, capillary     Status: None   Collection Time: 12/16/15  3:33 PM  Result Value Ref Range   Glucose-Capillary 97 65 - 99 mg/dL  Glucose, capillary     Status: None   Collection Time: 12/16/15  8:41 PM  Result Value Ref Range   Glucose-Capillary 99 65 - 99 mg/dL  Glucose, capillary     Status: Abnormal   Collection Time: 12/16/15 11:34 PM  Result Value Ref Range   Glucose-Capillary 116 (H) 65 - 99 mg/dL  Glucose, capillary     Status: Abnormal   Collection Time: 12/17/15  3:16 AM  Result Value Ref Range   Glucose-Capillary 102 (H) 65 - 99 mg/dL  CBC     Status: Abnormal   Collection Time: 12/17/15  3:22 AM  Result Value Ref Range   WBC 13.4 (H) 4.0 - 10.5 K/uL   RBC 3.57 (L) 3.87 - 5.11 MIL/uL   Hemoglobin 9.0 (L) 12.0 - 15.0 g/dL   HCT 16.1 (L) 09.6 - 04.5 %   MCV 81.2 78.0 - 100.0 fL   MCH 25.2 (L) 26.0 - 34.0 pg   MCHC 31.0 30.0 - 36.0 g/dL   RDW 40.9 (H) 81.1 - 91.4 %   Platelets 252 150 - 400 K/uL  Type and screen [facility]     Status: None   Collection Time: 12/17/15  5:21 AM  Result Value Ref Range   ABO/RH(D) B POS    Antibody Screen NEG    Sample Expiration 12/20/2015   ABO/Rh     Status: None   Collection Time: 12/17/15  5:21 AM  Result Value Ref Range   ABO/RH(D) B POS   Protime-INR     Status: Abnormal   Collection Time: 12/17/15  5:39 AM  Result Value Ref Range   Prothrombin Time 18.2 (H) 11.6 - 15.2 seconds   INR 1.50 (H) 0.00 - 1.49  Comprehensive metabolic panel     Status: Abnormal   Collection Time: 12/17/15  5:39 AM  Result Value Ref Range   Sodium 133 (L) 135 - 145 mmol/L   Potassium 4.7 3.5 - 5.1 mmol/L   Chloride 95 (L) 101 - 111 mmol/L   CO2 24 22 - 32 mmol/L   Glucose, Bld 114 (H) 65 - 99 mg/dL   BUN 11 6 - 20 mg/dL   Creatinine, Ser 7.82 (H) 0.44 - 1.00 mg/dL   Calcium 9.4 8.9 - 95.6 mg/dL   Total Protein 5.9 (L) 6.5 - 8.1 g/dL  Albumin 1.5 (L) 3.5 - 5.0 g/dL   AST 016778 (H) 15 - 41 U/L   ALT 264 (H) 14 - 54 U/L   Alkaline Phosphatase 1062 (H) 38 - 126 U/L   Total Bilirubin 10.8 (H) 0.3 - 1.2 mg/dL   GFR calc non Af Amer 15 (L) >60 mL/min   GFR calc Af Amer 17 (L) >60 mL/min   Anion gap 14 5 - 15  CBC     Status: Abnormal   Collection Time: 12/17/15  5:39 AM  Result Value Ref Range   WBC 12.8 (H) 4.0 - 10.5 K/uL   RBC 3.22 (L) 3.87 - 5.11 MIL/uL   Hemoglobin 8.3 (L) 12.0 - 15.0 g/dL   HCT 01.026.6 (L) 93.236.0 - 35.546.0 %   MCV 82.6 78.0 - 100.0 fL   MCH 25.8 (L) 26.0 - 34.0 pg   MCHC 31.2 30.0 - 36.0 g/dL   RDW 73.222.1 (H) 20.211.5 - 54.215.5 %   Platelets  150 - 400 K/uL    PLATELET CLUMPS NOTED ON SMEAR, COUNT APPEARS ADEQUATE  Lactic acid, plasma     Status: Abnormal   Collection Time: 12/17/15  5:39 AM  Result Value Ref Range   Lactic Acid, Venous 3.7 (HH) 0.5 - 1.9 mmol/L  Comprehensive metabolic panel     Status: Abnormal   Collection Time: 12/17/15  6:43 AM  Result Value Ref Range   Sodium 130 (L) 135 - 145 mmol/L   Potassium 5.3 (H) 3.5 - 5.1 mmol/L   Chloride 96 (L) 101 - 111 mmol/L   CO2 20 (L) 22 - 32 mmol/L   Glucose, Bld 127 (H) 65 - 99 mg/dL    BUN 14 6 - 20 mg/dL   Creatinine, Ser 7.062.73 (H) 0.44 - 1.00 mg/dL   Calcium 9.1 8.9 - 23.710.3 mg/dL   Total Protein 6.1 (L) 6.5 - 8.1 g/dL   Albumin 1.7 (L) 3.5 - 5.0 g/dL   AST 628804 (H) 15 - 41 U/L   ALT 280 (H) 14 - 54 U/L   Alkaline Phosphatase 1011 (H) 38 - 126 U/L   Total Bilirubin 10.3 (H) 0.3 - 1.2 mg/dL   GFR calc non Af Amer 15 (L) >60 mL/min   GFR calc Af Amer 18 (L) >60 mL/min   Anion gap 14 5 - 15  Culture, blood (Routine X 2) w Reflex to ID Panel     Status: None (Preliminary result)   Collection Time: 12/17/15  7:05 AM  Result Value Ref Range   Specimen Description BLOOD LEFT ANTECUBITAL    Special Requests IN PEDIATRIC BOTTLE 3CC    Culture PENDING    Report Status PENDING   Lactic acid, plasma     Status: Abnormal   Collection Time: 12/17/15  7:07 AM  Result Value Ref Range   Lactic Acid, Venous 2.2 (HH) 0.5 - 1.9 mmol/L  Glucose, capillary     Status: Abnormal   Collection Time: 12/17/15  7:49 AM  Result Value Ref Range   Glucose-Capillary 121 (H) 65 - 99 mg/dL    Studies/Results: No results found.    Assessment: Elevated liver enzymes. This has been felt to be due to shock liver.  Plan:   Continue supportive care. Obtain CT abdomen as planned.    SAM F Flint Hakeem 12/17/2015, 9:59 AM  Pager: [phone] If no answer or after 5 PM call [phone]

## 2015-12-17 NOTE — Progress Notes (Addendum)
This is a no charge note in response to nurse call  Brief progressive note  I was called by PA physician, Vernona RiegerLaura to see this patient due to hypotension. Pt is admitted due to new onset elevation of LFTs with obstructive jaundice which is being worked up. She also has left calf hematoma and hypotension. Per RN, pt's left calf hematoma has enlarged and her bp is lower than earlier, SBP down to 64. When I saw pt on the floor, she is oriented x 3. Has a big hematoma in left leg. bp is 76/38. She is dialysis pt, her SBP is running low, at 70 o 80s during day time. Clinically she dose not seem to have shock. The only concerning is whether she has lost a lot blood.  -will d/c sq Heparin-->switch to SCD for DVT PPX (only to right leg) -check CBC. If Hgb is <7.0-->will transfuse her. -check INR. Her INR was 1.62 on 12/14/14. If INR increase further, will give one dose of Vk, 1mg  by IV -500 cc NS bolus, then 50 cc/h for 5 hours-->try to maintain SBP at 70 to 80 -check lactate level.  Lorretta HarpXilin Nachelle Negrette, MD  [facility] Pager [phone]  If 7PM-7AM, please contact night-coverage [email or web address] Password [email or web address] 12/17/2015, 5:19 AM

## 2015-12-17 NOTE — Procedures (Signed)
Procedure: Aspiration of LLE hematoma  Indication: LLE hematoma  Surgeon: Charma IgoMichael Joeph Szatkowski, PA-C  Assist: None  Anesthesia: None  EBL: None  Complications: None  Findings: Risks/benefits discussed with pt who wished to proceed. Written consent obtained and time out called. The entire hematoma was painted with betadine. An 18 gauge needle was inserted superiorly and ~12050ml old blood aspirated. A new 18 gauge needle was inserted inferiorly and another ~15420ml old blood aspirated. This needed was reinserted in middle of hematoma with only ~20-4430ml aspirated. The leg was then wrapped tightly with an ACE. Pt tolerated the procedure well.    Freeman CaldronMichael J. Rhoderick Farrel, PA-C Pager: [phone]

## 2015-12-18 ENCOUNTER — Inpatient Hospital Stay (HOSPITAL_COMMUNITY): Payer: Medicare Other

## 2015-12-18 DIAGNOSIS — J9601 Acute respiratory failure with hypoxia: Secondary | ICD-10-CM

## 2015-12-18 DIAGNOSIS — I469 Cardiac arrest, cause unspecified: Secondary | ICD-10-CM | POA: Insufficient documentation

## 2015-12-18 LAB — COMPREHENSIVE METABOLIC PANEL
ALK PHOS: 975 U/L — AB (ref 38–126)
ALT: 927 U/L — AB (ref 14–54)
AST: 2904 U/L — AB (ref 15–41)
Albumin: 1.5 g/dL — ABNORMAL LOW (ref 3.5–5.0)
Anion gap: 20 — ABNORMAL HIGH (ref 5–15)
BILIRUBIN TOTAL: 10.6 mg/dL — AB (ref 0.3–1.2)
BUN: 25 mg/dL — AB (ref 6–20)
CALCIUM: 8.9 mg/dL (ref 8.9–10.3)
CHLORIDE: 95 mmol/L — AB (ref 101–111)
CO2: 16 mmol/L — ABNORMAL LOW (ref 22–32)
CREATININE: 4.01 mg/dL — AB (ref 0.44–1.00)
GFR calc Af Amer: 11 mL/min — ABNORMAL LOW (ref >60)
GFR, EST NON AFRICAN AMERICAN: 10 mL/min — AB (ref >60)
Glucose, Bld: 80 mg/dL (ref 65–99)
Potassium: 6.2 mmol/L — ABNORMAL HIGH (ref 3.5–5.1)
Sodium: 131 mmol/L — ABNORMAL LOW (ref 135–145)
TOTAL PROTEIN: 5.7 g/dL — AB (ref 6.5–8.1)

## 2015-12-18 LAB — PROTIME-INR
INR: 1.42 (ref 0.00–1.49)
PROTHROMBIN TIME: 17.4 s — AB (ref 11.6–15.2)

## 2015-12-18 LAB — GLUCOSE, CAPILLARY
GLUCOSE-CAPILLARY: 108 mg/dL — AB (ref 65–99)
Glucose-Capillary: 70 mg/dL (ref 65–99)
Glucose-Capillary: 153 mg/dL — ABNORMAL HIGH (ref 65–99)
Glucose-Capillary: 51 mg/dL — ABNORMAL LOW (ref 65–99)

## 2015-12-18 LAB — CBC
HEMATOCRIT: 25.4 % — AB (ref 36.0–46.0)
HEMOGLOBIN: 7.5 g/dL — AB (ref 12.0–15.0)
MCH: 25 pg — AB (ref 26.0–34.0)
MCHC: 29.5 g/dL — ABNORMAL LOW (ref 30.0–36.0)
MCV: 84.7 fL (ref 78.0–100.0)
Platelets: 224 10*3/uL (ref 150–400)
RBC: 3 MIL/uL — AB (ref 3.87–5.11)
RDW: 22.5 % — ABNORMAL HIGH (ref 11.5–15.5)
WBC: 16.1 10*3/uL — AB (ref 4.0–10.5)

## 2015-12-18 LAB — LACTIC ACID, PLASMA
Lactic Acid, Venous: 10.8 mmol/L (ref 0.5–1.9)
Lactic Acid, Venous: 13 mmol/L (ref 0.5–1.9)

## 2015-12-18 LAB — ABO/RH: ABO/RH(D): B POS

## 2015-12-18 MED ORDER — EPINEPHRINE HCL 1 MG/ML IJ SOLN
0.5000 ug/min | INTRAVENOUS | Status: DC
  Administered 2015-12-18: 5 ug/min via INTRAVENOUS
  Filled 2015-12-18: qty 4

## 2015-12-18 MED ORDER — DOPAMINE-DEXTROSE 3.2-5 MG/ML-% IV SOLN: 0.0000 ug/kg/min | INTRAVENOUS | Status: DC

## 2015-12-18 MED ORDER — MAGNESIUM SULFATE 2 GM/50ML IV SOLN
INTRAVENOUS | Status: AC
  Filled 2015-12-18: qty 50

## 2015-12-18 MED ORDER — DEXTROSE 5 % IV SOLN
1.0000 mg/h | INTRAVENOUS | Status: DC
  Filled 2015-12-18 (×2): qty 10

## 2015-12-18 MED ORDER — NOREPINEPHRINE BITARTRATE 1 MG/ML IV SOLN
0.0000 ug/min | INTRAVENOUS | Status: DC
  Filled 2015-12-18 (×2): qty 4

## 2015-12-18 MED ORDER — AMIODARONE HCL IN DEXTROSE 360-4.14 MG/200ML-% IV SOLN
INTRAVENOUS | Status: AC
  Filled 2015-12-18: qty 200

## 2015-12-18 MED ORDER — FAMOTIDINE IN NACL 20-0.9 MG/50ML-% IV SOLN
20.0000 mg | Freq: Two times a day (BID) | INTRAVENOUS | Status: DC
  Filled 2015-12-18: qty 50

## 2015-12-18 MED ORDER — SODIUM BICARBONATE 8.4 % IV SOLN
INTRAVENOUS | Status: DC
  Administered 2015-12-18: 07:00:00 via INTRAVENOUS
  Filled 2015-12-18 (×2): qty 150

## 2015-12-18 MED ORDER — SODIUM BICARBONATE 8.4 % IV SOLN: 50.0000 meq | Freq: Once | INTRAVENOUS | Status: DC

## 2015-12-18 MED ORDER — SODIUM CHLORIDE 0.9 % IV BOLUS (SEPSIS): 500.0000 mL | Freq: Once | INTRAVENOUS | Status: DC

## 2015-12-18 MED ORDER — FENTANYL CITRATE (PF) 100 MCG/2ML IJ SOLN
INTRAMUSCULAR | Status: AC
  Filled 2015-12-18: qty 2

## 2015-12-18 MED ORDER — IOPAMIDOL (ISOVUE-300) INJECTION 61%
75.0000 mL | Freq: Once | INTRAVENOUS | Status: AC | PRN
Start: 2015-12-18 — End: 2015-12-18
  Administered 2015-12-18: 75 mL via INTRAVENOUS

## 2015-12-18 MED ORDER — SODIUM CHLORIDE 0.9 % IV BOLUS (SEPSIS): 1000.0000 mL | Freq: Once | INTRAVENOUS | Status: DC

## 2015-12-18 MED ORDER — MIDAZOLAM HCL 2 MG/2ML IJ SOLN
INTRAMUSCULAR | Status: AC
  Filled 2015-12-18: qty 2

## 2015-12-18 MED ORDER — FAMOTIDINE IN NACL 20-0.9 MG/50ML-% IV SOLN
20.0000 mg | Freq: Two times a day (BID) | INTRAVENOUS | Status: DC
  Filled 2015-12-18 (×2): qty 50

## 2015-12-18 MED ORDER — MORPHINE BOLUS VIA INFUSION
2.0000 mg | INTRAVENOUS | Status: DC | PRN
  Filled 2015-12-18: qty 4

## 2015-12-18 MED FILL — Medication: Qty: 1 | Status: AC

## 2015-12-19 LAB — CULTURE, BLOOD (ROUTINE X 2)
Culture: NO GROWTH
Culture: NO GROWTH

## 2015-12-21 ENCOUNTER — Telehealth: Payer: Self-pay

## 2015-12-21 NOTE — Telephone Encounter (Signed)
On 12/21/2015 I received a death certificate from [employer] ([place]). The death certificate is for burial. The patient is a patient of Doctor Vassie LollAlva. [facility] tomorrow (12/22/2015) for signature. On 12/25/2015 I received the death certificate back from Doctor Alvla. I got the death certificate ready and called the funeral home to let them know the death certificate is ready for pickup.

## 2015-12-22 LAB — CULTURE, BLOOD (ROUTINE X 2)
CULTURE: NO GROWTH
Culture: NO GROWTH

## 2016-01-02 NOTE — Progress Notes (Signed)
Phlebotomy called concerning the delay for collecting STAT lactic acid and the tech reports that "it did not come up on my screen". Phlebotomy tech arrived to unit about 0300 and patient was in CT. Phlebotomy has been called and informed of patient's return back to unit at this time.

## 2016-01-02 NOTE — Progress Notes (Signed)
Patient ID: Katherine LauthClara P Brave, female   DOB: 01/08/1934, 82 y.o.   MRN: [id number] Discussed with Dr Vassie LollAlva, patient is now comfort care, will sign off

## 2016-01-02 NOTE — Progress Notes (Signed)
CRITICAL VALUE ALERT  Critical value received:  Lactic Acid   Date of notification:  2016-05-02  Time of notification:  0535  Critical value read back:Yes.    Nurse who received alert:  Julious OkaKarimah Abdussalaam RN  MD notified (1st page):  L. Harduck NP  Time of first page:  0536  MD notified (2nd page):   Time of second page:  Responding MD:    Time MD responded:

## 2016-01-02 NOTE — Procedures (Signed)
INTUBATION PROCEDURE NOTE  Indication: respiratory/cardiac arrest Consent: Emergent Time Out: no Medications: none Paralytic/RSI: no Technique: DL Blade: MAC 3 Cords Visualized: yes View: 1-2 # of attempts: 1 Tube confirmation:   Chest rise: yes  Bilateral Breath Sounds: yes  Color change on CO2 detector: yes  CXR: Pending Successful placement: yes    Galvin Profferaniel Nehan Flaum, DO., MS [facility]

## 2016-01-02 NOTE — Progress Notes (Signed)
Patient noted unresponsive with agonal breathing (RR 2-4), pulse 78 at this morning when attempting to treat elevated lactic acid levels. Rapid response called, then code called  Patient was intubated and tranpsorted to 2M16 where patient coded prior to bedside report being completed.  Family phoned and on the to the hospital now.

## 2016-01-02 NOTE — Progress Notes (Signed)
[facility] ROUNDING NOTE   Subjective:   Interval History:  Patient aspiration of leg hematoma , CT then hypoglyemia with code blue overnight- intubated and moved to 4M- CT done but not read- BP remains very low- lactate now 10- she is unresponsive.  Family at bedside- they stated that patient had been DNR in the past- understand that she will pass away regardless of what we do -   Objective:  Vital signs in last 24 hours:  Temp:  [97.4 F (36.3 C)-97.9 F (36.6 C)] 97.5 F (36.4 C) (07/17 0400) Pulse Rate:  [70-91] 85 (07/17 0400) Resp:  [15-29] 22 (07/17 0400) BP: (70-98)/(27-72) 70/33 mmHg (07/17 0400) SpO2:  [96 %-100 %] 98 % (07/17 0400) FiO2 (%):  [100 %] 100 % (07/17 0623) Weight:  [105.915 kg (233 lb 8 oz)] 105.915 kg (233 lb 8 oz) (07/17 0400)  Weight change: -2.085 kg (-4 lb 9.6 oz) Filed Weights   12/16/15 0404 12/16/15 0745 12/14/2015 0400  Weight: 108 kg (238 lb 1.6 oz) 108 kg (238 lb 1.6 oz) 105.915 kg (233 lb 8 oz)    Intake/Output: I/O last 3 completed shifts: In: 731 [P.O.:50; I.V.:631; IV Piggyback:50] Out: 0    Intake/Output this shift:     Intubated- sedated - unresponsive CVS RRR RS Clear  AS soft no ascites Ext no edema   Basic Metabolic Panel:  Recent Labs Lab 12/15/15 0248 12/16/15 0334 12/17/15 0539 12/17/15 0643 12/17/2015 0418  NA 133* 136 133* 130* 131*  K 4.3 5.0 4.7 5.3* 6.2*  CL 99* 100* 95* 96* 95*  CO2 20* 16*  GLUCOSE 102* 92 114* 127* 80  BUN 25*  CREATININE 2.87* 4.35* 2.81* 2.73* 4.01*  CALCIUM 9.1 9.7 9.4 9.1 8.9    Liver Function Tests:  Recent Labs Lab 12/15/15 1047 12/16/15 0334 12/17/15 0539 12/17/15 0643 12/05/2015 0418  AST 702* 660* 778* 804* 2904*  ALT 259* 239* 264* 280* 927*  ALKPHOS 996* 998* 1062* 1011* 975*  BILITOT 10.7* 10.3* 10.8* 10.3* 10.6*  PROT 6.2* 5.9* 5.9* 6.1* 5.7*  ALBUMIN 1.6* 1.5* 1.5* 1.7* 1.5*    Recent Labs Lab 12/16/15 0334  LIPASE 123*     Recent Labs Lab 12/16/15 0339  AMMONIA 114*    CBC:  Recent Labs Lab 12/15/15 0248 12/16/15 0334 12/17/15 0322 12/17/15 0539 12/16/2015 0418  WBC 9.1 10.4 13.4* 12.8* 16.1*  HGB 9.0* 8.6* 9.0* 8.3* 7.5*  HCT 29.1* 28.8* 29.0* 26.6* 25.4*  MCV 80.6 81.1 81.2 82.6 84.7  PLT 183 210 252 PLATELET CLUMPS NOTED ON SMEAR, COUNT APPEARS ADEQUATE 224    Cardiac Enzymes:  Recent Labs Lab 12/11/15 1859 12/12/15 0020 12/12/15 0644  TROPONINI 0.28* 0.28* 0.27*    BNP: Invalid input(s): POCBNP  CBG:  Recent Labs Lab 12/17/15 1938 12/28/2015 0003 12/13/2015 0409 12/31/2015 0559 12/04/2015 0626  GLUCAP 118* 108* 70 51* 153*    Microbiology: Results for orders placed or performed during the hospital encounter of 2015-12-21  Culture, blood (Routine X 2) w Reflex to ID Panel     Status: None   Collection Time: 12-21-15  1:30 PM  Result Value Ref Range Status   Specimen Description BLOOD RIGHT ANTECUBITAL  Final   Special Requests   Final    BOTTLES DRAWN AEROBIC AND ANAEROBIC  10CC AER 5CC ANA   Culture NO GROWTH 5 DAYS  Final   Report Status 12/14/2015 FINAL  Final  Culture, blood (Routine  X 2) w Reflex to ID Panel     Status: None   Collection Time: 08/01/15  1:35 PM  Result Value Ref Range Status   Specimen Description BLOOD RIGHT HAND  Final   Special Requests IN PEDIATRIC BOTTLE  4CC  Final   Culture NO GROWTH 5 DAYS  Final   Report Status 12/14/2015 FINAL  Final  MRSA PCR Screening     Status: None   Collection Time: 08/01/15 10:24 PM  Result Value Ref Range Status   MRSA by PCR NEGATIVE NEGATIVE Final    Comment:        The GeneXpert MRSA Assay (FDA approved for NASAL specimens only), is one component of a comprehensive MRSA colonization surveillance program. It is not intended to diagnose MRSA infection nor to guide or monitor treatment for MRSA infections.   C difficile quick screen w PCR reflex     Status: None   Collection Time: 12/10/15  9:34 PM   Result Value Ref Range Status   C Diff antigen NEGATIVE NEGATIVE Final   C Diff toxin NEGATIVE NEGATIVE Final   C Diff interpretation No C. difficile detected.  Final  Culture, blood (routine x 2)     Status: None (Preliminary result)   Collection Time: 12/14/15  8:10 AM  Result Value Ref Range Status   Specimen Description BLOOD HEMODIALYSIS  Final   Special Requests BOTTLES DRAWN AEROBIC AND ANAEROBIC 10CC  Final   Culture NO GROWTH 3 DAYS  Final   Report Status PENDING  Incomplete  Culture, blood (routine x 2)     Status: None (Preliminary result)   Collection Time: 12/14/15  8:40 AM  Result Value Ref Range Status   Specimen Description BLOOD HEMODIALYSIS  Final   Special Requests BOTTLES DRAWN AEROBIC AND ANAEROBIC  10CC  Final   Culture NO GROWTH 3 DAYS  Final   Report Status PENDING  Incomplete  Culture, blood (Routine X 2) w Reflex to ID Panel     Status: None (Preliminary result)   Collection Time: 12/17/15  7:05 AM  Result Value Ref Range Status   Specimen Description BLOOD LEFT ANTECUBITAL  Final   Special Requests IN PEDIATRIC BOTTLE 3CC  Final   Culture PENDING  Incomplete   Report Status PENDING  Incomplete    Coagulation Studies:  Recent Labs  12/17/15 0539 12/19/2015 0418  LABPROT 18.2* 17.4*  INR 1.50* 1.42    Urinalysis: No results for input(s): COLORURINE, LABSPEC, PHURINE, GLUCOSEU, HGBUR, BILIRUBINUR, KETONESUR, PROTEINUR, UROBILINOGEN, NITRITE, LEUKOCYTESUR in the last 72 hours.  Invalid input(s): APPERANCEUR    Imaging: No results found.   Medications:   . DOPamine    . epinephrine 10 mcg/min (12/19/2015 0657)  .  sodium bicarbonate  infusion 1000 mL 100 mL/hr at 12/16/2015 0658   . darbepoetin (ARANESP) injection - DIALYSIS  60 mcg Intravenous Q Tue-HD  . doxycycline  100 mg Oral Q12H  . famotidine (PEPCID) IV  20 mg Intravenous Q12H  . feeding supplement (NEPRO CARB STEADY)  237 mL Oral BID BM  . insulin aspart  0-9 Units Subcutaneous Q4H   . magnesium sulfate      . midazolam      . midodrine  10 mg Oral TID WC  . multivitamin  1 tablet Oral Daily  . pantoprazole  40 mg Oral Daily  . scopolamine  1 patch Transdermal Q72H  . senna-docusate  1 tablet Oral BID  . sodium bicarbonate  50 mEq Intravenous Once  .  sodium chloride  1,000 mL Intravenous Once  . sodium chloride  500 mL Intravenous Once  . sodium chloride flush  3 mL Intravenous Q12H   morphine injection, ondansetron **OR** ondansetron (ZOFRAN) IV, promethazine **OR** promethazine **OR** promethazine    Dialysis: TTS NW 4h 108kg 2/2.25 bath Hep 6000 R IJ cath (maturing L AVF) Venof 50/thurs, hect 2 ug   Assessment/ Plan:  1. Jaundice/ ^LFT's - no biliary obstruction on [place]. /ANA/SPEPunremarkable- labs trending down but BP still lower than usual [place] at this time 2. ESRD HD tts - K up  -if continuing full court press will need CRRT but family does not seem to want that 3 Chronic hypotension/volume on midodrine- her BP is much lower here than outpt center - now with elevated lactate- DNR no pressors  4 Debility - WC bound at present; has been living at [place]- did not want any of this according to family  5. Failure-to-thrive - now with code times 2- unresponsive- continued low BP- family understanding of situation- they want DNR and understand that she will pass- probably today even if we continue with efforts- will leave to CCM withdrawal procedures and renal will sign off - I told family to get all important people to her here today   LOS: 9 Hanley Rispoli A @TODAY @7 :25 AM

## 2016-01-02 NOTE — Plan of Care (Signed)
PCCM PLAN OF CARE   Code blue called arrived at bedside.  Patient non responsive and agonally breathing, lost pulse and CPR started immediately.  RT bagging patient, oral airway inserted.  Pads placed on patient after 3-4 rounds of CPR with 2mg  of epi given, 1 amp of D50 (CBG = 50), 1amp sodium bicarb and 1g calcium.  ROSC achieved BP 130/60 rhythm was monomorphic VT with pulse and stable BP.  150mg  amiodarone pushed, patient endotracheally intubated at bedside with good color change and breath sounds.    Patient transferred to MICU.  Total critical care time: 30 min  Critical care time was exclusive of separately billable procedures and treating other patients.  Critical care was necessary to treat or prevent imminent or life-threatening deterioration.  Critical care was time spent personally by me on the following activities: development of treatment plan with patient and/or surrogate as well as nursing, discussions with consultants, evaluation of patient's response to treatment, examination of patient, obtaining history from patient or surrogate, ordering and performing treatments and interventions, ordering and review of laboratory studies, ordering and review of radiographic studies, pulse oximetry and re-evaluation of patient's condition.   Galvin Profferaniel Yakima Kreitzer, DO., MS [facility]

## 2016-01-02 NOTE — Progress Notes (Signed)
Spoke to daughters and informed them of mother being transferred to [id number].

## 2016-01-02 NOTE — Care Management Important Message (Signed)
Important Message  Patient Details  Name: Myrtice LauthClara P Morejon MRN: [id number] Date of Birth: July 26, 1933   Medicare Important Message Given:  Yes    Kyla BalzarineShealy, [place] Pfund Abena 12/21/2015, 2:57 PM

## 2016-01-02 NOTE — Progress Notes (Signed)
PULMONARY / CRITICAL CARE MEDICINE   Name: Katherine [name] MRN: [id number] DOB: 1933/08/29    ADMISSION DATE:  Dec 30, 2015 CONSULTATION DATE:  12/18/14  REFERRING MD:  Dr. Chilton Si  CHIEF COMPLAINT:  Nausea, persistent loose stools and shortness of breath  HISTORY OF PRESENT ILLNESS:   82 y.o. Female with ESRD on HD, DM type II, resident of [facility] and in process of moving to ALF, presented to [facility] for evaluation of 1-2 weeks duration of diarrhea and had blood work notable for increased LFTs, mixed cholestatic and hepatocellular pattern. Bilirubin of 10, ALP 1200, transaminases in the 300-800 range. For comparison, 2 months ago the patient's LFTs were completely normal and her INR at that time was 1.03.  An abdominal ultrasound was notable for significant hepatic steatosis, status post cholecystectomy, 8 mm common bile duct consistent with prior cholecystectomy state but no intrahepatic biliary ductal dilatation, no space-occupying lesions of the liver.  PAST MEDICAL HISTORY :  She  has a past medical history of Hypertension; Renal disorder; High cholesterol; Type II diabetes mellitus (HCC); Sickle cell trait (HCC) (02/03/2012); Arthritis (02/03/2012); Difficulty sleeping; Nocturia; Pulmonary edema; Acute diastolic CHF (congestive heart failure) (HCC); OA (osteoarthritis) of knee; Altered mental status; Hypoglycemia; Dyslipidemia; Morbid obesity (HCC); ESRD on hemodialysis (HCC); and Renovascular hypertension.  PAST SURGICAL HISTORY: She  has past surgical history that includes Total knee arthroplasty (2011); Tubal ligation (1979); Hernia repair; CATARACTS; Total knee arthroplasty (Left, 01/20/2014); Joint replacement; Breast biopsy (1980's); Insertion of dialysis catheter (Right, 10/12/2015); and Bascilic vein transposition (Left, 10/12/2015).   SOCIAL HISTORY: She  reports that she quit smoking about 26 years ago. Her smoking use included Cigarettes. She has a 25 pack-year smoking history. She has  never used smokeless tobacco. She reports that she does not drink alcohol or use illicit drugs.  REVIEW OF SYSTEMS:   Unable to review as patient is intubated  SUBJECTIVE:  This morning was hyoglycemic and then unresponsive. Hypoglycemia was treated but patient became more unstable with bradycardia and hypotension. Polymorphic V Tachy vs a-fib with BBB was noted on tele, code blue was called at 0630 (please see code note for details). Was intubated and given Amiodarone and sodium bicarb for lactic acidosis of 10.8. Dopamine was started for pressure support.    VITAL SIGNS: BP 70/33 mmHg  Pulse 85  Temp(Src) 97.5 F (36.4 C) (Oral)  Resp 22  Ht  (1.626 m)  Wt 233 lb 8 oz (105.915 kg)  BMI 40.06 kg/m2  SpO2 98%  HEMODYNAMICS:    VENTILATOR SETTINGS: Vent Mode:  [-] PRVC FiO2 (%):  [100 %] 100 % Set Rate:  [14 bmp] 14 bmp Vt Set:  [440 mL] 440 mL PEEP:  [5 cmH20] 5 cmH20 Plateau Pressure:  [20 cmH20] 20 cmH20  INTAKE / OUTPUT: I/O last 3 completed shifts: In: 731 [P.O.:50; I.V.:631; IV Piggyback:50] Out: 528 [Other:528]  PHYSICAL EXAMINATION: General:  Sedated, laying in bed on vent Neuro:  Sedated, not following commands  HEENT:  Sluggish pupils, vent in place Cardiovascular:  Tachycardic, regular rhythm, no murmurs Lungs:  On vent, clear to auscultation bilaterally Abdomen:  Obese abdomen, soft, hypoactive bowel sounds, periumbical mass palpated, well healed midline scar Musculoskeletal: Normal muscle bulk Skin:  No rashes  LABS:  BMET  Recent Labs Lab 12/17/15 0539 12/17/15 0643 12/29/2015 0418  NA 133* 130* 131*  K 4.7 5.3* 6.2*  CL 95* 96* 95*  CO2 24 20* 16*  BUN 11 14 25*  CREATININE 2.81*  2.73* 4.01*  GLUCOSE 114* 127* 80    Electrolytes  Recent Labs Lab 12/17/15 0539 12/17/15 0643 03-Oct-2015 0418  CALCIUM 9.4 9.1 8.9    CBC  Recent Labs Lab 12/17/15 0322 12/17/15 0539 03-Oct-2015 0418  WBC 13.4* 12.8* PENDING  HGB 9.0* 8.3* 7.5*   HCT 29.0* 26.6* 25.4*  PLT 252 PLATELET CLUMPS NOTED ON SMEAR, COUNT APPEARS ADEQUATE PENDING    Coag's  Recent Labs Lab 12/14/15 0222 12/17/15 0539 03-Oct-2015 0418  INR 1.62* 1.50* 1.42    Sepsis Markers  Recent Labs Lab 12/17/15 0707 12/17/15 1445 03-Oct-2015 0418  LATICACIDVEN 2.2* 5.2* 10.8*    ABG  Recent Labs Lab 12/11/15 1854  PHART 7.508*  PCO2ART 34.5*  PO2ART 88.5    Liver Enzymes  Recent Labs Lab 12/17/15 0539 12/17/15 0643 03-Oct-2015 0418  AST 778* 804* 2904*  ALT 264* 280* 927*  ALKPHOS 1062* 1011* 975*  BILITOT 10.8* 10.3* 10.6*  ALBUMIN 1.5* 1.7* 1.5*    Cardiac Enzymes  Recent Labs Lab 12/11/15 1859 12/12/15 0020 12/12/15 0644  TROPONINI 0.28* 0.28* 0.27*    Glucose  Recent Labs Lab 12/17/15 1645 12/17/15 1938 03-Oct-2015 0003 03-Oct-2015 0409 03-Oct-2015 0559 03-Oct-2015 0626  GLUCAP 153* 118* 108* 70 51* 153*    Imaging No results found.   STUDIES:  An abdominal ultrasound notable for significant hepatic steatosis, status post cholecystectomy, 8 mm common bile duct consistent with prior cholecystectomy state but no intrahepatic biliary ductal dilatation, no space-occupying lesions of the liver.  CULTURES: 7/13 blood cx: NGTD 7/16 blood cx: pending  ANTIBIOTICS: 7/11 Doxycycline >>  SIGNIFICANT EVENTS: 7/17: code Blue   LINES/TUBES: HD cath on R internal jugular on admission R upper arm fistula on admission PIV 7/16>>   ASSESSMENT / PLAN:  PULMONARY A: Acute Respiratory Failure- intubated on 7/17 after code blue called P:   Continue vent support for now Family would like comfort care at this point, will extubate in the near future  CARDIOVASCULAR A:  Shock: septic vs cardiogenic Concern for hepatic congestion and CHF- TTE only showed grade 1 diastolic dysfunction Hx of autonomic dysfunction P:  Continue pressor support for now   RENAL A:   Lactic acidosis ESRD requiring HD Hyperkalemia P:   Nephrology  consulted, appreciate assistance.  No HD at this point as patient is comfort care Fluid resuscitation and bicarb for acidosis  No electrolyte repletion due to comfort care  GASTROINTESTINAL A:   New LFT elevation  Obstructive jaundice Abdominal hernia Abdominal mass Persistent nausea and vomiting P:   CT ab performed, no signs of obstruction  Surgery consulted, but patient is now comfort care IV Phenergan or Zofran PRN  HEMATOLOGIC A:   L calf hematoma Anemia (hgb 7.5) P:  SCDs  INFECTIOUS A:   Concern for super-infection of L calf hematoma P:   Stop Doxy   ENDOCRINE A:   T2DM (insulin dependent)   Hypoglycemia P:   Stop Sensitive SSI  NEUROLOGIC A:   Sedation Pain control P:   Morphine ggt and PRN for comfort   FAMILY  - Updates: Family updated at bedside by Dr. Vassie LollAlva on 7/17    Anders Simmondshristina Misha Antonini, MD [facility], PGY-2  12/15/2015, 6:38 AM

## 2016-01-02 NOTE — Progress Notes (Signed)
Nutrition Brief Note  Chart reviewed. Pt now transitioning to comfort care.  No nutrition interventions warranted at this time.  Please consult as needed.   Kimberly Harris, RD, LDN, CNSC Pager [phone] After Hours Pager [phone]    

## 2016-01-02 NOTE — Progress Notes (Signed)
   10-28-15 0900  Clinical Encounter Type  Visited With Patient and family together  Visit Type Follow-up;Spiritual support;Patient actively dying  Referral From Nurse  Consult/Referral To Chaplain  Spiritual Encounters  Spiritual Needs Prayer;Emotional  Stress Factors  Patient Stress Factors Exhausted;Health changes  Family Stress Factors Exhausted;Health changes   Chaplain visited with family and patient. Chaplain prayed with family and patient.  Chaplain offered ministry of presence to family.  [name] January 23, 2016 9:10 AM

## 2016-01-02 NOTE — Progress Notes (Signed)
   2015/11/19 0754  Clinical Encounter Type  Visited With Patient and family together;Health care provider  Visit Type Initial;Critical Care;Patient actively dying;Spiritual support  Referral From Nurse  Spiritual Encounters  Spiritual Needs Prayer;Emotional;Grief support  Stress Factors  Family Stress Factors Health changes;Loss   Chaplain responded to a page to support patient's family (two daughters and grandson). Patient has coded this morning, and the family is going towards DNR and withdrawal of care later on today. Chaplain offered prayer and support. Chaplain introduced spiritual care services. Chaplain will seek to follow up. Spiritual care services available as needed.   Alda Ponderdam M Shyanne Mcclary, Chaplain 2015-12-26 7:56 AM

## 2016-01-02 NOTE — Progress Notes (Signed)
Patient unresponsive. Family at bedside. Dr Vassie LollAlva and Renal MD in to see patient and spoke with family. Family states that her wishes are to be an DNR.

## 2016-01-02 NOTE — Discharge Summary (Signed)
82 year old nursing home resident with ESRD on dialysis admitted 7/8 with diarrhea and elevated LFTs to 800 range, with increase alkaline phosphatase and bilirubin of 10. Abdominal ultrasound did not show CBD or intrahepatic biliary dilatation. She developed polymorphic VT arrest on the floor today and was intubated and transferred to the ICU. She was placed on multiple pressors through her hemodialysis catheter CT abdomen showed stable appearance of seroma and no evidence of incarceration of hernia  On exam-comatose, intubated, pupils mid dilated 4 mm and not reactive to light, S1-S2 tachycardia, decreased breath sounds bilateral, soft nontender abdomen with firm mass like swelling in the midline  Labs-lactic acidosis, K6.2 and elevated LFTs as above Chest x-ray reviewed ET tube in right mainstem  Impression/plan-  Cardiogenic shock post cardiac arrest, initial rhythm being VT -causes unclear, does not seem to be abdominal cause-reviewed CT and discussed with surgery, could be a primary cardiac  Cause of elevated LFTs is unclear  In spite of multiple pressors and bicarbonate drip she remained hypotensive Family was very clear that she would not want to be on life support for any extended period of time. After some discussion we decided on withdrawing life support and she passed away soon after  Cause of death -cardio genic shock, elevated liver function tests Morbidities including ESRD, DM-2  Katherine [name]. MD

## 2016-01-02 NOTE — Progress Notes (Signed)
Nursing Note: Patient received from [facility].  Patient with doppler femoral pulse and hypotensive.  After 5 minutes patient no longer with pulse and CPR restarted.  Dr. Kendrick FriesMcQuaid with E-Link on camera and directing code blue.  ROSC obtained. Family on way.  Assessment begun.  Bedside report received from [facility] primary RN.

## 2016-01-02 NOTE — Progress Notes (Signed)
Family wanted to connect with patients son before comfort care started. Will continue to monitor.

## 2016-01-02 NOTE — Care Management Note (Signed)
Case Management Note Donn PieriniKristi Starlynn Klinkner RN, BSN Unit 2W-Case Manager [phone]  Patient Details  Name: Myrtice LauthClara P Lazard MRN: [id number] Date of Birth: 1934-04-01  Subjective/Objective:   Pt admitted obstructive jaundice                 Action/Plan: P[facility]- CSW following for possible SNF placement with comfort care-   Expected Discharge Date:                  Expected Discharge Plan:  Skilled Nursing Facility  In-House Referral:  Clinical Social Work  Discharge planning Services  CM Consult  Post Acute Care Choice:    Choice offered to:     DME Arranged:    DME Agency:     HH Arranged:    HH Agency:     Status of Service:  Completed, signed off  If discussed at [employer] Meetings, dates discussed:    Additional Comments:  12/04/2015- 1100- Donn PieriniKristi Kyzer Blowe RN CM- per unit secretary - pt expired this AM-    Zenda AlpersWebster, Lenn SinkKristi Hall, RN 12/31/2015, 11:02 AM

## 2016-01-02 NOTE — Progress Notes (Signed)
eLink Physician-Brief Progress Note Patient Name: Myrtice LauthClara P Mimbs DOB: 02-Feb-1934 MRN: [id number]   Date of Service  April 15, 2016  HPI/Events of Note  Transferred to ICU in the setting of a cardiac arrest. She was admitted several days ago with abnormal LFTs, jaundice.  Has been vomiting.  Overnight more vomiting, then became unresponsive around 0600 with hypoglycemia. Hypoglycemia treated but encephalopathy persisted and became associated with hypotension, bradycardia.  She was intubated for airway protection by fellow.  Amiodarone given.  Now developing refractory shock.  eICU Interventions  Transfer to ICU Bolus saline 1 L now Dopamine, levophed, epinephrine drips Bicarb bolus x2 amps then infusion at 100cc/hr F/u result of CT abdomen.      Intervention Category Major Interventions: Code management / supervision  Max FickleDouglas Carlyann Placide April 15, 2016, 6:43 AM

## 2016-01-02 NOTE — Progress Notes (Signed)
Patient terminally extubated to RA per MD order. No complications. RN and family at bedside. RT will continue to monitor.

## 2016-01-02 NOTE — Significant Event (Signed)
Rapid Response Event Note Bedside RN called for agonal respirations and unresponsive.  Overview: Time Called: 0600 Arrival Time: 0602 Event Type: Cardiac, Respiratory  Initial Focused Assessment: Upon arrival pt unresponsive, no palpable pulse, agonal respirations.  Interventions:  cpr started immediately, pt received epi 2mg , 1 amp D50 (CBG 50),1 amp sodium bicarb, and 1 g calcium, amiodarone 150mg  IVP given, after several rounds of compressions with ROSC pt intubated and transferred to 2M16. Pt coded a second time after transport to 492m16. CPR resumed, pt given epi 1mg , started on dopamine gtt, levophed gtt, and epi gtt, additional sodium bicarb x2 given followed by sodium bicarb infusion 100 cc/hr     Event Summary: Name of Physician Notified: Dr. Ave FilterVerde at 0600    at    Outcome: Transferred (Comment), Coded and survived transferred to 612m16     [facility] Sax[facility] HospitalHULAR, Sandi CarneLESLIE Paige

## 2016-01-02 NOTE — Progress Notes (Signed)
PT Cancellation Note  Patient Details Name: Katherine [name] MRN: [id number] DOB: 1934-03-12   Cancelled Treatment:     Noted pt passed away.   Tawni MillersWhite, Kaemon Barnett F 12/21/2015, 11:57 AM  Eber Jonesawn Tiya Schrupp,PT Acute Rehabilitation [phone] (pager)

## 2016-01-02 DEATH — deceased

## 2016-01-15 ENCOUNTER — Ambulatory Visit: Payer: Medicare Other | Admitting: Cardiovascular Disease

## 2016-07-07 IMAGING — CR DG CHEST 2V
2 series · 2 of 2 positions shown · non-contrast
Comparison: Prior chest x-ray 08/21/2015

CLINICAL DATA: 81-year-old female with dyspnea and shortness of
breath which has been progressive over the past 2 weeks

EXAM:
CHEST  2 VIEW

[w chest lat]
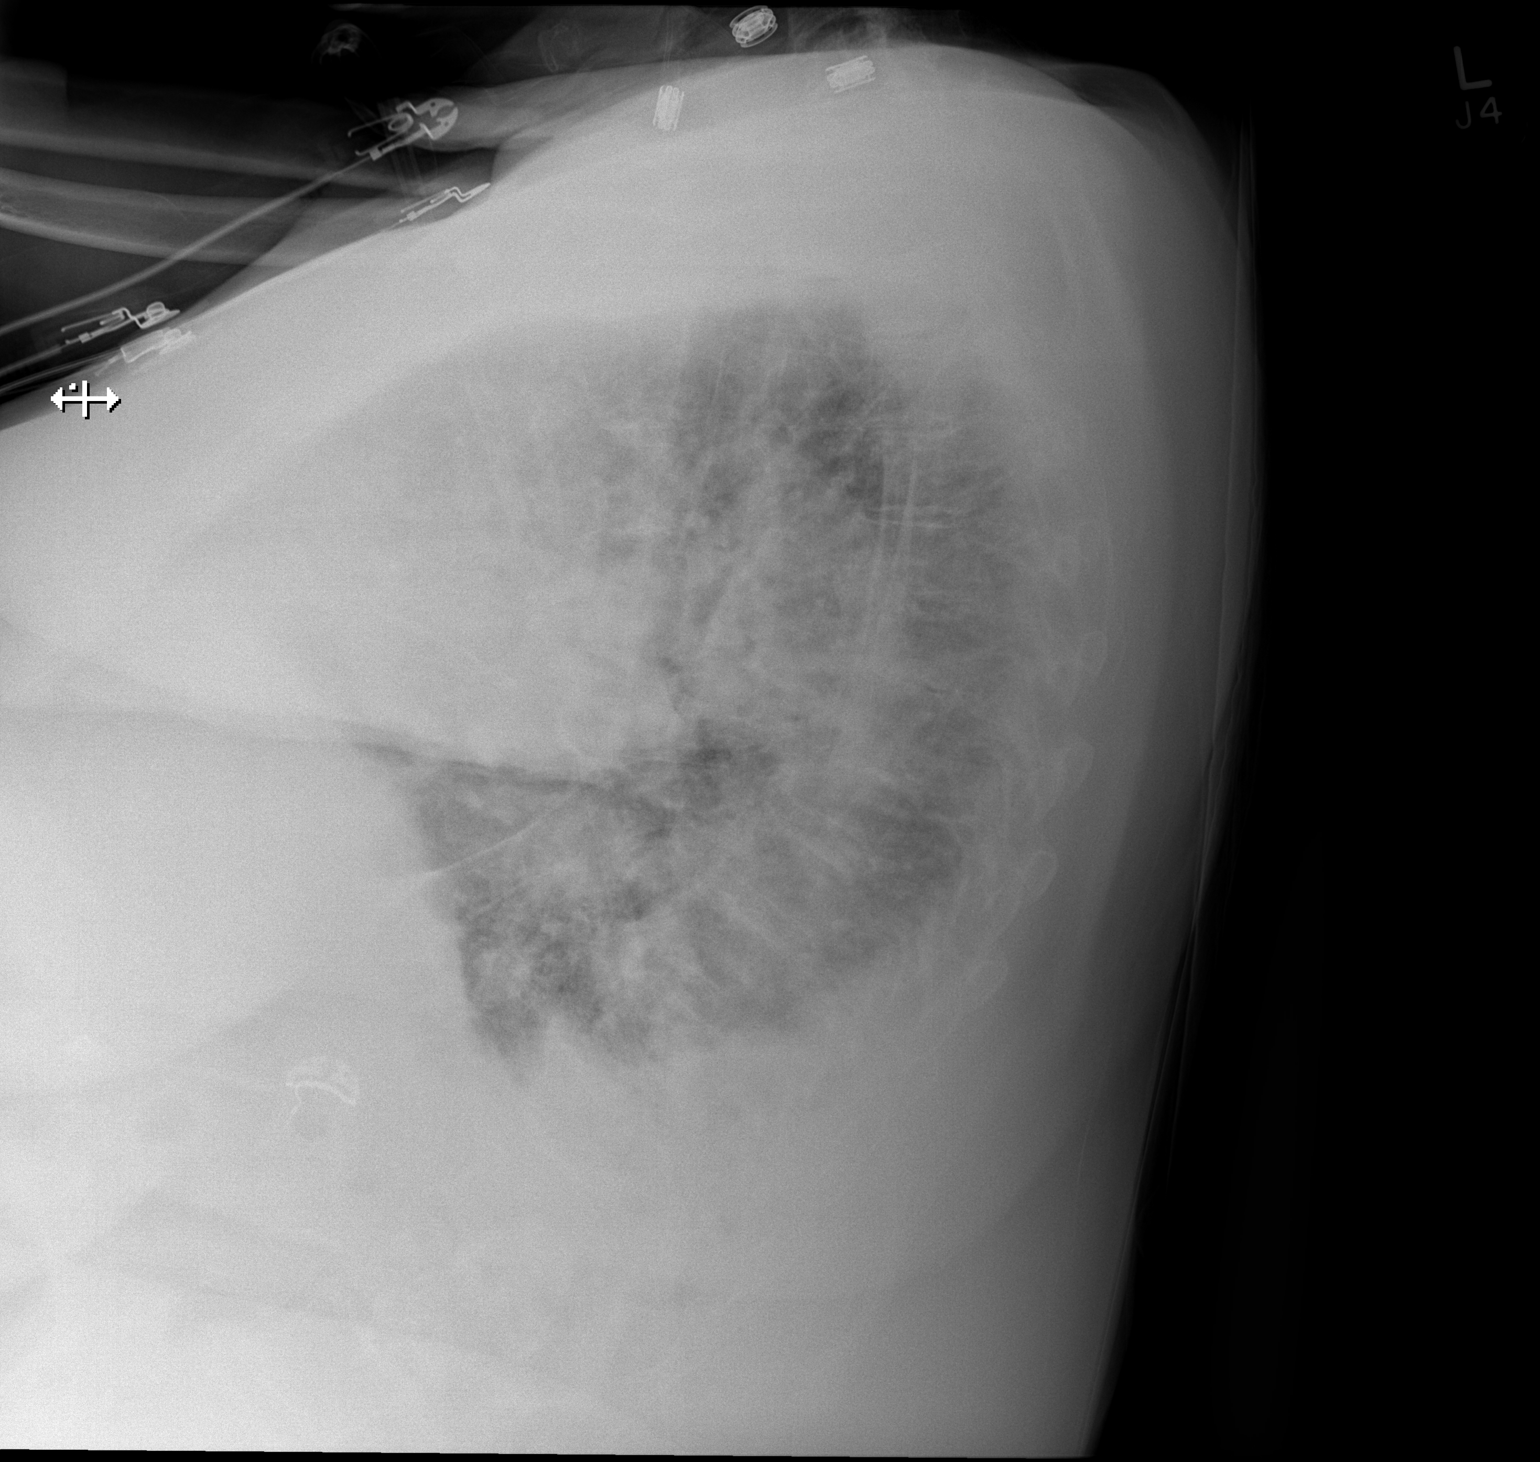

[x chest ap]
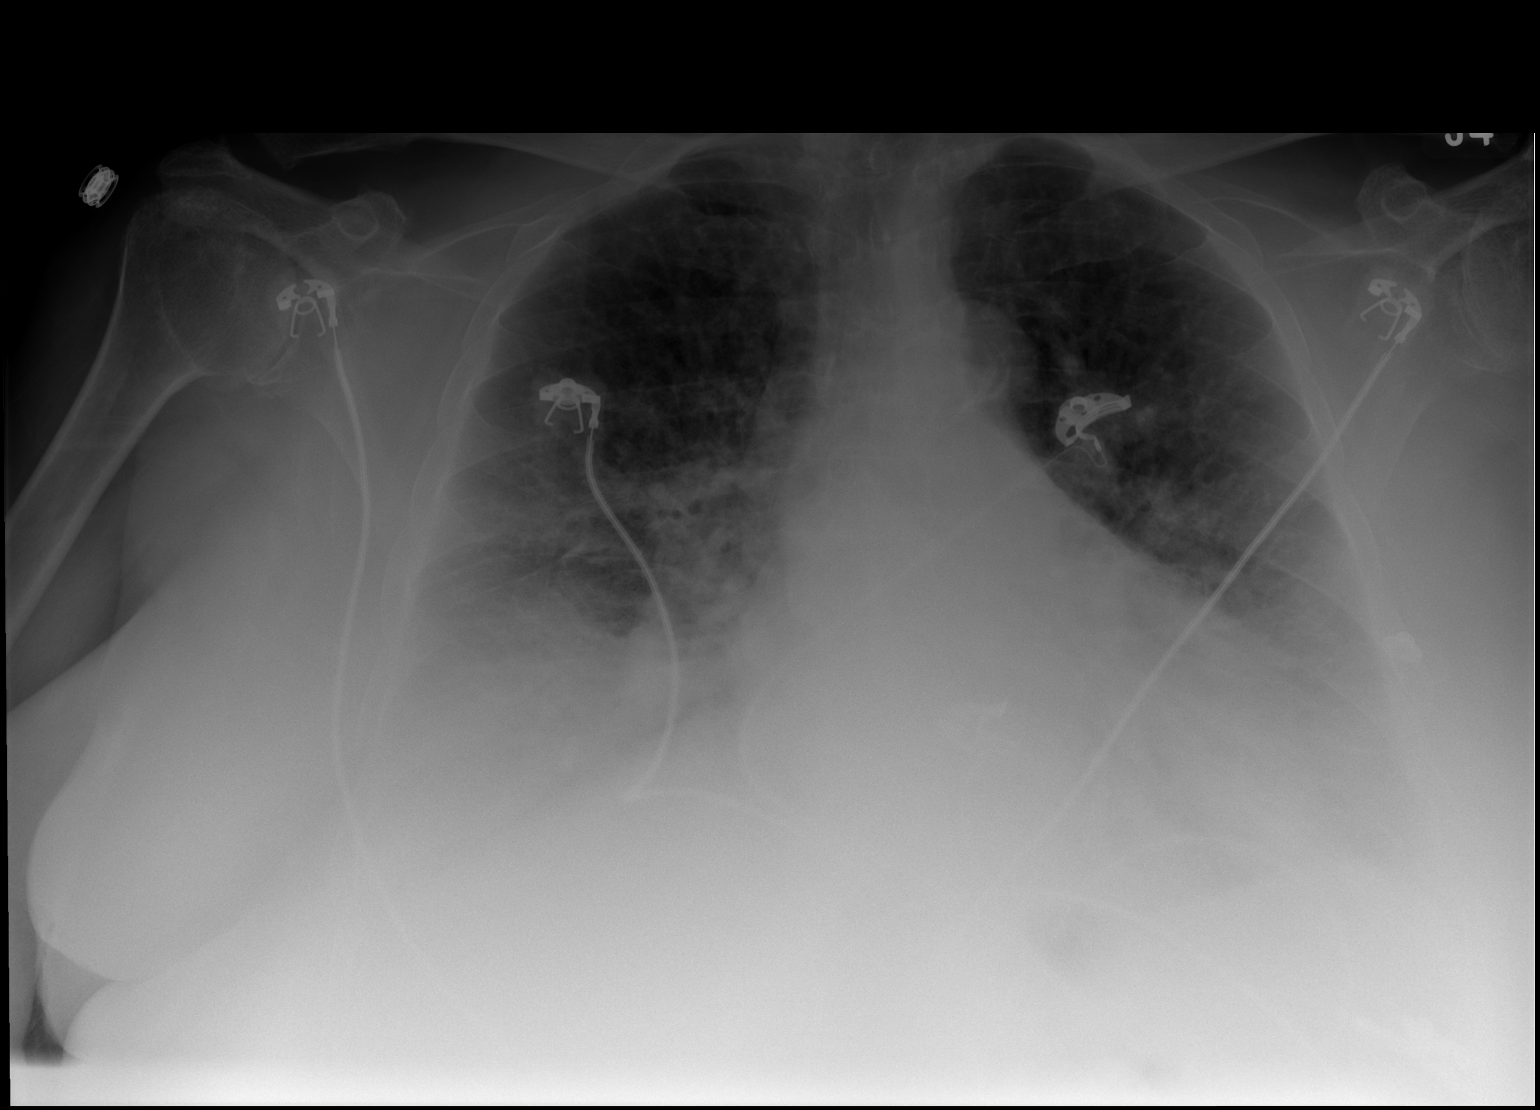

[2 of 2 positions shown; findings below may reference images not displayed]

FINDINGS: Cardiomegaly is similar compared to prior. Atherosclerotic
calcifications present in the transverse aorta. Interval development
of bilateral small layering pleural effusions and associated
bibasilar atelectasis. Increased pulmonary vascular congestion now
with mild interstitial edema. No acute osseous abnormality.
IMPRESSION: Radiographic findings are most consistent with mild -moderate CHF
and bilateral small layering pleural effusions.

## 2016-09-29 IMAGING — CT CT ABD-PELV W/ CM
2 of 5 series · 14 of 46 positions shown, 16 images · IV contrast (iopamidol)
Comparison: Abdomen ultrasound 12/09/2015. CT Abdomen 10/04/2015.
CT Abdomen and Pelvis 02/03/2009.

CLINICAL DATA: 82 year old female, with new elevation of liver
function tests, obstructive jaundice, on dialysis. Initial
encounter.

EXAM:
CT ABDOMEN AND PELVIS WITH CONTRAST
TECHNIQUE: Multidetector CT imaging of the abdomen and pelvis was performed
using the standard protocol following bolus administration of
intravenous contrast.
CONTRAST:  75mL YZEXVO-SSS IOPAMIDOL (YZEXVO-SSS) INJECTION 61%

[Series 2: a/p w/ 5mm · axial · 0.98mm/px · z∈[+558,+968]mm · 11 of 94 slices shown, 13 images]
[im 6/94  soft-tissue]
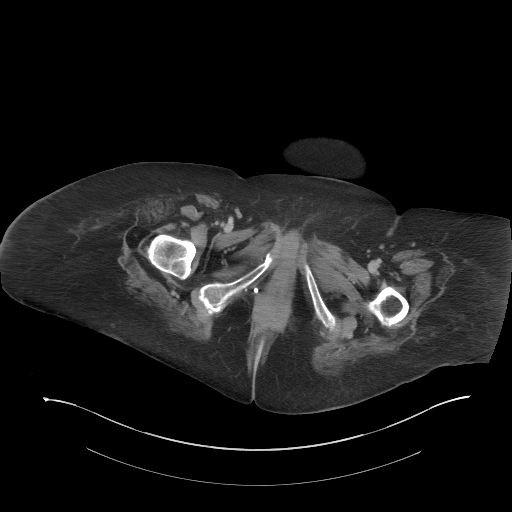
[im 6/94  bone]
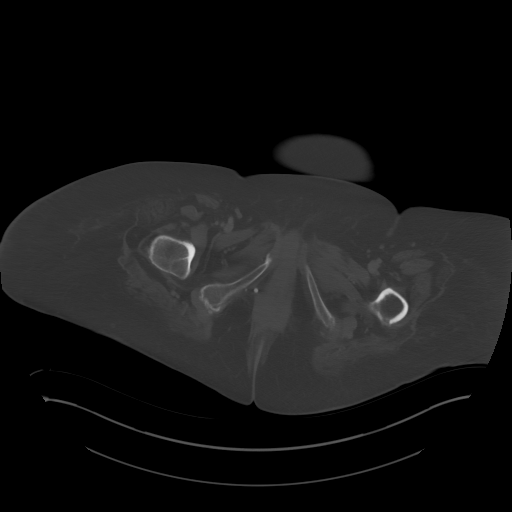
[im 16/94  soft-tissue]
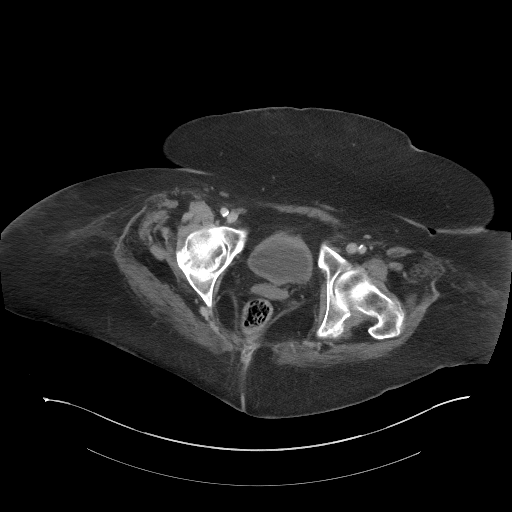
[im 21/94  soft-tissue]
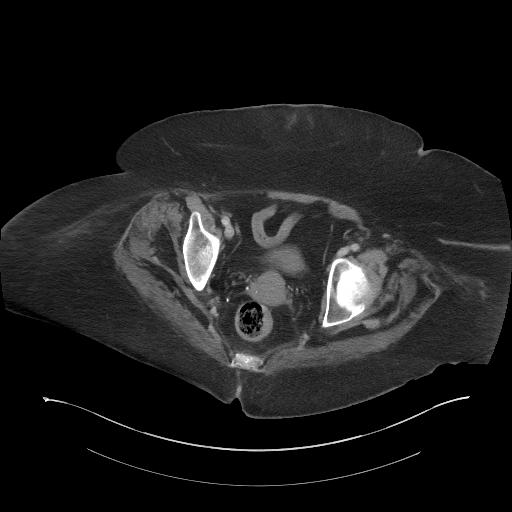
[im 32/94  soft-tissue]
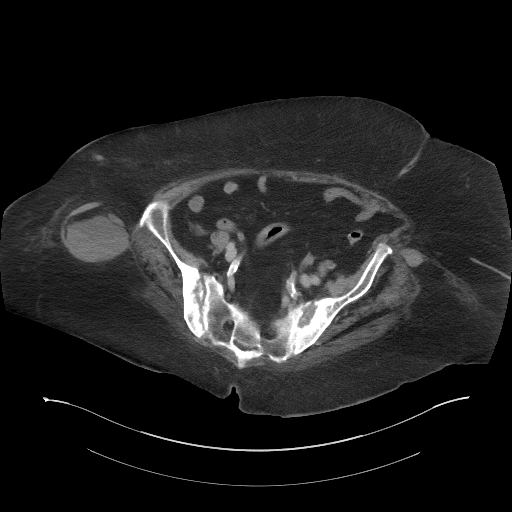
[im 37/94  soft-tissue]
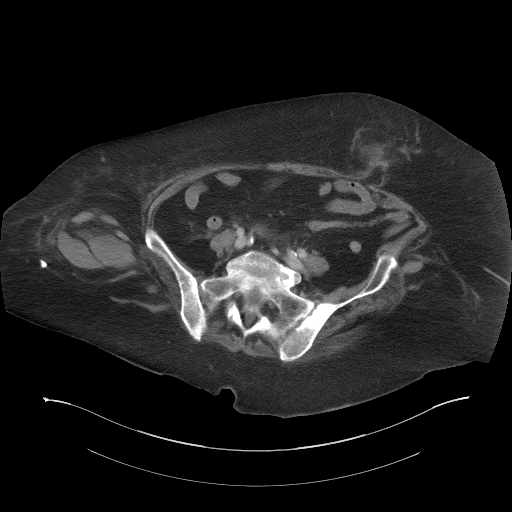
[im 47/94  soft-tissue]
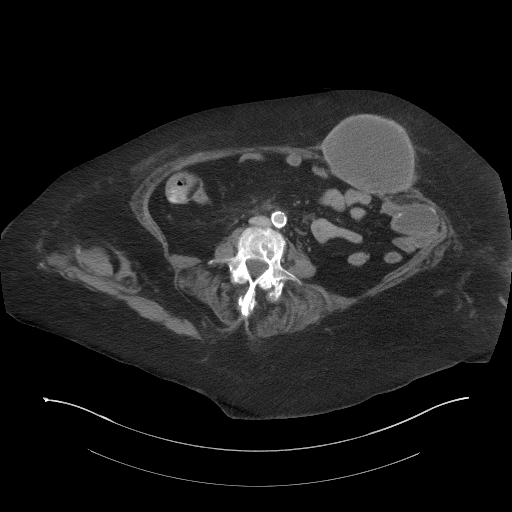
[im 57/94  soft-tissue]
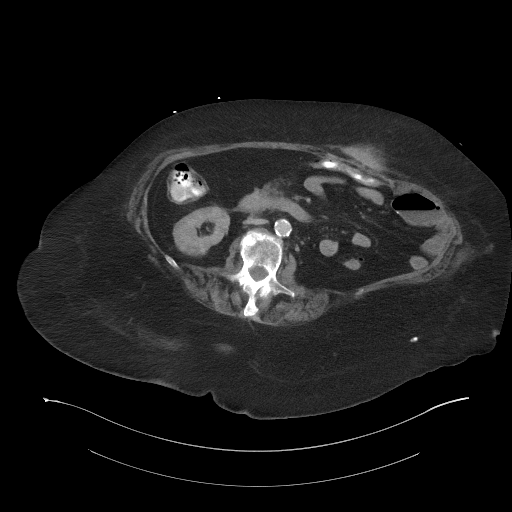
[im 63/94  soft-tissue]
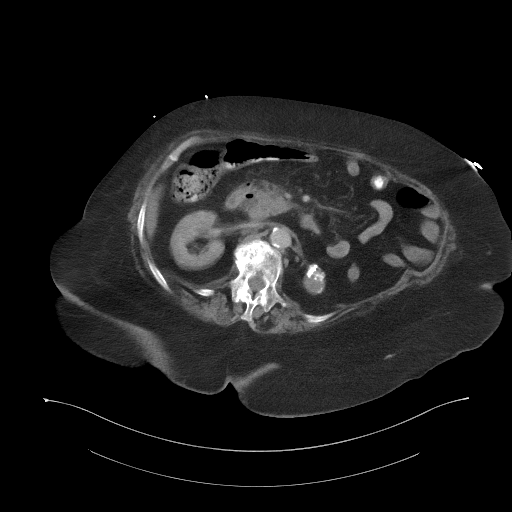
[im 73/94  soft-tissue]
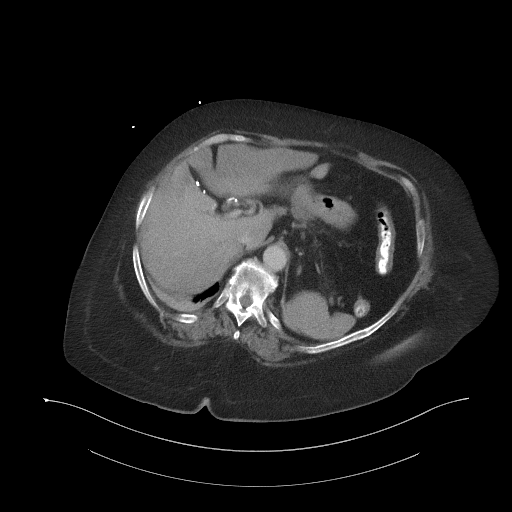
[im 73/94  bone]
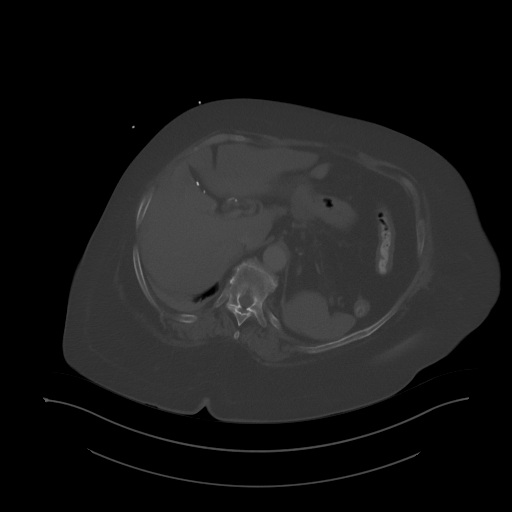
[im 78/94  soft-tissue]
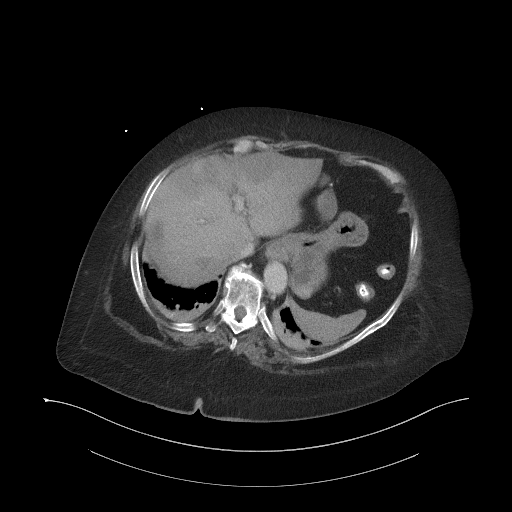
[im 88/94  soft-tissue]
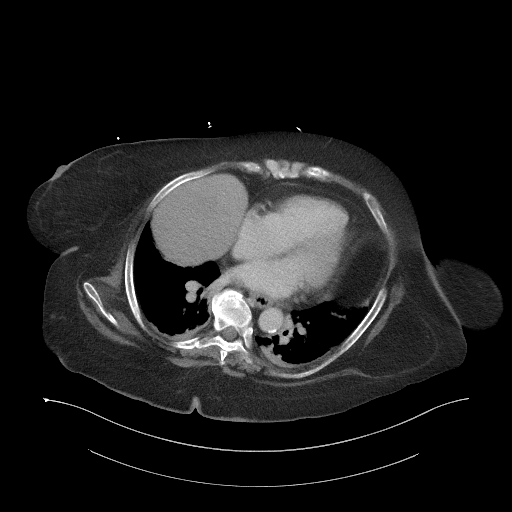

[Series 5: a/p w/ cor · coronal · 0.99mm/px · 3 of 162 slices shown]
[im 54/162  soft-tissue]
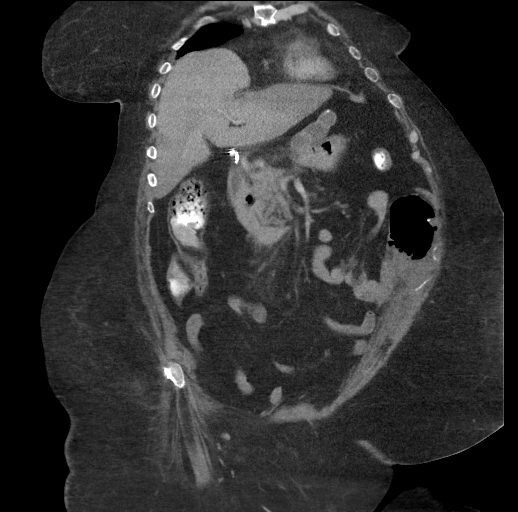
[im 72/162  soft-tissue]
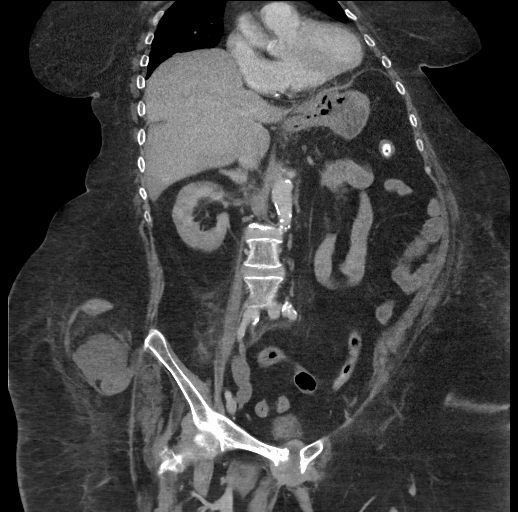
[im 90/162  soft-tissue]
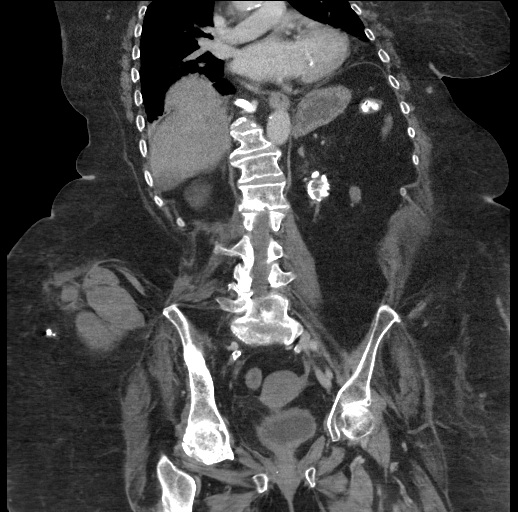

[14 of 46 positions shown; findings below may reference images not displayed]

FINDINGS: Large body habitus.

Stable cardiomegaly. Stable elevation of the right hemidiaphragm.
Mildly increased dependent atelectasis at both lung bases. No
pleural or pericardial effusion.

Osseous structures in the lower chest and abdomen appear stable
[REDACTED]. Advanced degenerative changes in the spine and evidence
of bilateral sacroiliitis again noted. 3 cm masslike soft tissue has
developed at the pubic symphysis since 1515 with associated bony
resorption of the symphysis (series 2, image 86). No inflammatory
stranding surrounding the mass.

There is a 6 x 8 x 9 cm mildly heterogeneous fluid collection in the
right flank just lateral to the right iliac wing. There is a subtle
hematocrit level which most resembles a hematoma. This tracks toward
the right lower lumbar paraspinal muscles (series 2, image 60 and
image 48). There is a a small volume of similar nodular soft tissue
density just lateral to the left hip greater trochanter (series 2,
image 84. The Organized 9.5 cm ventral abdominal fluid collection is
unchanged [REDACTED]. Several small lower abdominal wall subcutaneous
nodular areas which may be injection site.

No pelvic free fluid. Negative uterus and adnexa. Negative rectum.
Diminutive urinary bladder.

Negative sigmoid colon. Diverticulosis of the descending colon
without active inflammation. Oral contrast present at the splenic
flexure. The large ventral abdominal fluid collection is inseparable
from the transverse colon as seen on sagittal image 75, but there is
dense contrast in the transverse colon here and no definite contrast
within the collection. Negative right colon and appendix. Negative
terminal ileum most of the small bowel is decompressed. In the left
abdomen there is a 3 cm small bowel loop containing air in fluid at
the level of an anastomosis. This is unchanged [REDACTED]. Small
gastric hiatal hernia is stable. Negative stomach. There is a
blind-ending small bowel loop in the gastro Dread Matteo ligament,
unchanged since 1515.

The second and third portions of the duodenum appear mildly to
moderately inflamed with surrounding stranding (series 2, images
36-39), new [REDACTED]. There is a superimposed chronic duodenum
diverticulum here, as well as motion artifact. The pancreatic body
and tail are atrophied. The pancreatic head appears mildly
indistinct, but this could be related to motion. Gallbladder is
surgically absent. No definite enlargement of the CBD. No
intrahepatic biliary ductal dilatation identified. Heterogeneous
liver enhancement with geographic areas of hypodensity along the
liver margins (series 2, image 18). These areas do not completely
fade on the delayed images. This is new [REDACTED]. The hepatic veins
appear to be patent. The main and intrahepatic portal veins appear
diminutive but patent.

No abdominal free air or free fluid. Negative spleen. Stable adrenal
gland thickening. Left renal atrophy and dense dystrophic
calcification again noted. There is some enhancement of the right
kidney, but no contrast excretion on delayed images. Aortoiliac
calcified atherosclerosis noted. Major arterial structures in the
abdomen and pelvis are patent. No lymphadenopathy.
IMPRESSION: 1. New subcutaneous hematomas along the right flank (estimated blood
volume 216 mL), right gluteal muscles, and along the left hip. Query
Coagulopathy.
2. Heterogeneous enhancement of the liver [REDACTED] with geographic,
mostly peripheral, areas of hypodensity. This is nonspecific but
does not appear masslike and does not resemble metastatic disease.
Hepatic steatosis or differential liver perfusion would seem more
likely. The portal vein and hepatic veins appear patent.
3. Inflammatory changes involving the duodenum are new [REDACTED],
and there could be acute pancreatitis (of the pancreatic head)
associated. (The pancreatic body and tail are chronically
atrophied.) No biliary ductal dilatation identified.
4. Stable 9.5 cm ventral abdominal wall organized fluid collection
which could reflect chronic seroma or abscess. This is inseparable
from the transverse colon, but there is no strong evidence of
fistula.
5. Soft tissue resorption of the pubic symphysis since 1515. In
light of chronic renal failure and adrenal hyperplasia perhaps this
represents sequelae of hyperparathyroidism (Brown tumor).
